# Patient Record
Sex: Female | Born: 1935 | Race: White | Hispanic: No | Marital: Single | Smoking: Never smoker
Health system: Southern US, Community
[De-identification: ages and names within clinical notes are randomized; demographics above are authoritative.]

## PROBLEM LIST (undated history)

## (undated) DIAGNOSIS — M1990 Unspecified osteoarthritis, unspecified site: Secondary | ICD-10-CM

## (undated) DIAGNOSIS — I480 Paroxysmal atrial fibrillation: Secondary | ICD-10-CM

## (undated) DIAGNOSIS — I440 Atrioventricular block, first degree: Secondary | ICD-10-CM

## (undated) DIAGNOSIS — E559 Vitamin D deficiency, unspecified: Secondary | ICD-10-CM

## (undated) DIAGNOSIS — L900 Lichen sclerosus et atrophicus: Secondary | ICD-10-CM

## (undated) DIAGNOSIS — M810 Age-related osteoporosis without current pathological fracture: Secondary | ICD-10-CM

## (undated) DIAGNOSIS — I495 Sick sinus syndrome: Secondary | ICD-10-CM

## (undated) DIAGNOSIS — E039 Hypothyroidism, unspecified: Secondary | ICD-10-CM

## (undated) HISTORY — DX: Age-related osteoporosis without current pathological fracture: M81.0

## (undated) HISTORY — PX: HAND SURGERY: SHX662

## (undated) HISTORY — DX: Unspecified osteoarthritis, unspecified site: M19.90

## (undated) HISTORY — DX: Lichen sclerosus et atrophicus: L90.0

## (undated) HISTORY — DX: Vitamin D deficiency, unspecified: E55.9

---
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
## 1972-02-17 HISTORY — PX: ABDOMINAL HYSTERECTOMY: SHX81

## 1999-06-24 ENCOUNTER — Other Ambulatory Visit: Admission: RE | Admit: 1999-06-24 | Discharge: 1999-06-24 | Payer: Self-pay | Admitting: *Deleted

## 2000-06-16 ENCOUNTER — Other Ambulatory Visit: Admission: RE | Admit: 2000-06-16 | Discharge: 2000-06-16 | Payer: Self-pay | Admitting: *Deleted

## 2001-09-07 ENCOUNTER — Ambulatory Visit (HOSPITAL_COMMUNITY): Admission: RE | Admit: 2001-09-07 | Discharge: 2001-09-07 | Payer: Self-pay | Admitting: Gastroenterology

## 2003-08-25 ENCOUNTER — Encounter: Admission: RE | Admit: 2003-08-25 | Discharge: 2003-08-25 | Payer: Self-pay | Admitting: Orthopedic Surgery

## 2003-11-15 ENCOUNTER — Encounter: Admission: RE | Admit: 2003-11-15 | Discharge: 2003-11-15 | Payer: Self-pay | Admitting: Orthopedic Surgery

## 2003-11-19 ENCOUNTER — Ambulatory Visit (HOSPITAL_BASED_OUTPATIENT_CLINIC_OR_DEPARTMENT_OTHER): Admission: RE | Admit: 2003-11-19 | Discharge: 2003-11-19 | Payer: Self-pay | Admitting: Orthopedic Surgery

## 2003-11-19 ENCOUNTER — Ambulatory Visit (HOSPITAL_COMMUNITY): Admission: RE | Admit: 2003-11-19 | Discharge: 2003-11-19 | Payer: Self-pay | Admitting: Orthopedic Surgery

## 2004-07-25 ENCOUNTER — Other Ambulatory Visit: Admission: RE | Admit: 2004-07-25 | Discharge: 2004-07-25 | Payer: Self-pay | Admitting: Gynecology

## 2011-12-21 ENCOUNTER — Other Ambulatory Visit: Payer: Self-pay | Admitting: Orthopedic Surgery

## 2011-12-21 MED ORDER — BUPIVACAINE 0.25 % ON-Q PUMP SINGLE CATH 300ML: 300.0000 mL | INJECTION | Status: DC

## 2011-12-21 MED ORDER — DEXAMETHASONE SODIUM PHOSPHATE 10 MG/ML IJ SOLN: 10.0000 mg | Freq: Once | INTRAMUSCULAR | Status: DC

## 2011-12-21 NOTE — Progress Notes (Signed)
Preoperative surgical orders have been place into the [facility] hospital system for Kristine Holden on 12/21/2011, 8:45 AM  by Patrica Duel for surgery on 01/25/12.  Preop Total Knee orders including Bupivacaine On-Q pump, IV Tylenol, and IV Decadron as long as there are no contraindications to the above medications. Avel Peace, PA-C

## 2012-01-12 ENCOUNTER — Encounter (HOSPITAL_COMMUNITY): Payer: Self-pay | Admitting: Pharmacy Technician

## 2012-01-20 NOTE — Progress Notes (Signed)
Holter monitor report 11/19/10 Dr. Anne Fu on chart, carotid duplex report 04/25/10 Dr. Anne Fu on chart

## 2012-01-20 NOTE — Progress Notes (Signed)
Last EKG 01/13/12 Dr. Anne Fu on chart, last office visit and cardiac clearance note 12/11/11 Dr. Anne Fu on chart, stress test 12/11/11 on chart, medical clearance Dr. Laurann Montana 11/12/11 on chart.

## 2012-01-20 NOTE — Patient Instructions (Addendum)
20 HONOUR SCHWIEGER  01/20/2012   Your procedure is scheduled on: 01/25/12  Report to [facility] at 10:00 AM.  Call this number if you have problems the morning of surgery [phone]: [phone]   Remember:   Do not eat food or drink After Midnight 01/24/12.       Take these medicines the morning of surgery with A SIP OF WATER: synthroid   Do not wear jewelry, make-up or nail polish.  Do not wear lotions, powders, or perfumes. You may wear deodorant.  Do not shave 48 hours prior to surgery. Men may shave face and neck.  Do not bring valuables to the hospital.  Contacts, dentures or bridgework may not be worn into surgery.  Leave suitcase in the car. After surgery it may be brought to your room.  For patients admitted to the hospital, checkout time is 11:00 AM the day of discharge.    Special Instructions: Shower using CHG 2 nights before surgery and the night before surgery.  If you shower the day of surgery use CHG.  Use special wash - you have one bottle of CHG for all showers.  You should use approximately 1/3 of the bottle for each shower.   Please read over the following fact sheets that you were given: MRSA Information, blood fact sheet, incentive spirometer fact sheet. Birdie Sons, RN  pre op nurse call if needed [phone]    FAILURE TO FOLLOW THESE INSTRUCTIONS MAY RESULT IN CANCELLATION OF YOUR SURGERY   Patient Signature: ___________________________________________

## 2012-01-21 ENCOUNTER — Encounter (HOSPITAL_COMMUNITY): Payer: Self-pay

## 2012-01-21 ENCOUNTER — Other Ambulatory Visit (HOSPITAL_COMMUNITY): Payer: Self-pay | Admitting: Orthopedic Surgery

## 2012-01-21 ENCOUNTER — Encounter (HOSPITAL_COMMUNITY)
Admission: RE | Admit: 2012-01-21 | Discharge: 2012-01-21 | Disposition: A | Discharge status: Home / Self Care | Payer: Medicare Other | Source: Ambulatory Visit | Attending: Orthopedic Surgery | Admitting: Orthopedic Surgery

## 2012-01-21 HISTORY — DX: Hypothyroidism, unspecified: E03.9

## 2012-01-21 HISTORY — DX: Unspecified osteoarthritis, unspecified site: M19.90

## 2012-01-21 LAB — CBC
HCT: 40.1 % (ref 36.0–46.0)
MCH: 30.5 pg (ref 26.0–34.0)
MCV: 90.5 fL (ref 78.0–100.0)
RDW: 13.2 % (ref 11.5–15.5)
WBC: 5.5 10*3/uL (ref 4.0–10.5)

## 2012-01-21 LAB — PROTIME-INR
INR: 0.92 (ref 0.00–1.49)
Prothrombin Time: 12.3 seconds (ref 11.6–15.2)

## 2012-01-21 LAB — COMPREHENSIVE METABOLIC PANEL
Albumin: 3.6 g/dL (ref 3.5–5.2)
BUN: 19 mg/dL (ref 6–23)
CO2: 26 mEq/L (ref 19–32)
Chloride: 104 mEq/L (ref 96–112)
Creatinine, Ser: 0.62 mg/dL (ref 0.50–1.10)
GFR calc non Af Amer: 82 mL/min — ABNORMAL LOW (ref >90)
Total Bilirubin: 0.4 mg/dL (ref 0.3–1.2)

## 2012-01-21 LAB — URINALYSIS, ROUTINE W REFLEX MICROSCOPIC
Bilirubin Urine: NEGATIVE
Glucose, UA: NEGATIVE mg/dL
Ketones, ur: NEGATIVE mg/dL
pH: 5 (ref 5.0–8.0)

## 2012-01-21 LAB — URINE MICROSCOPIC-ADD ON

## 2012-01-21 LAB — APTT: aPTT: 23 seconds — ABNORMAL LOW (ref 24–37)

## 2012-01-21 NOTE — Progress Notes (Signed)
miroc ua results faxed to dr Lequita Halt by epic.

## 2012-01-24 ENCOUNTER — Other Ambulatory Visit: Payer: Self-pay | Admitting: Orthopedic Surgery

## 2012-01-24 NOTE — H&P (Signed)
Kristine Holden  DOB: 05/13/1927 Single / Language: English / Race: White Female  Date of Admission:01/25/2012  Chief complaint:  Right knee pain  History of Present Illness The patient is a 78 year old female who comes in for a preoperative History and Physical. The patient is scheduled for a right total knee arthroplasty to be performed by Dr. Frank V. Aluisio, MD at [facility] on 01/25/12. The patient is a 78 year old female who presents today for follow up of their knee. The patient is being followed for their right knee pain. They are out from cortisone injection. Symptoms reported today include: pain. The patient feels that they are doing poorly (Patient states that the injection helped for a short period and went back to how it felt before the injection.). Current treatment includes: Aleve. Ms. Baller did not have any prolonged benefit from the cortisone. She is now ready to get the knee replaced. They have been treated conservatively in the past for the above stated problem and despite conservative measures, they continue to have progressive pain and severe functional limitations and dysfunction. They have failed non-operative management including home exercise, medications, and injections. It is felt that they would benefit from undergoing total joint replacement. Risks and benefits of the procedure have been discussed with the patient and they elect to proceed with surgery. There are no active contraindications to surgery such as ongoing infection or rapidly progressive neurological disease.   Problem List Osteoarthritis, knee (715.96)   Allergies Augmentin *PENICILLINS*. Sickness Penicillins. Rash.   Family History Cerebrovascular Accident. brother Congestive Heart Failure. father Father. Deceased, Myocardial infarction. age 74 Mother. Deceased, Deep vein thrombosis. age 47; Pulmonary Embolous Brother 1. Deceased, Coronary artery disease, CABG.  age late 70's   Social History Tobacco / smoke exposure. yes outdoors only Children. 0 Illicit drug use. no Marital status. single Copy of Drug/Alcohol Rehab (Previously). no Exercise. Exercises daily; does running / walking and other No alcohol use Number of flights of stairs before winded. 4-5 Pain Contract. no Current work status. retired Drug/Alcohol Rehab (Currently). no Tobacco use. Never smoker. Advance Directives. Living Will, Healthcare POA Post-Surgical Plans. Plan to go home with sister.   Medication History Synthroid (75MCG Tablet, Oral) Active. Aleve ( Oral) Specific dose unknown - Active. (prn) Aspirin Childrens (81MG Tablet Chewable, Oral) Active.   Past Surgical History Rotator Cuff Repair. Date: 2005. right Appendectomy. Date: 1952. Hysterectomy. Date: 1974. complete (non-cancerous) Tonsillectomy. Date: 1948.   Medical History Hypercholesterolemia Hypothyroidism Hemorrhoids Urinary Incontinence Varicose veins Eczema   Review of Systems General:Present- Night Sweats. Not Present- Chills, Fever, Fatigue, Weight Gain, Weight Loss and Memory Loss. Skin:Not Present- Hives, Itching, Rash, Eczema and Lesions. HEENT:Not Present- Tinnitus, Headache, Double Vision, Visual Loss, Hearing Loss and Dentures. Respiratory:Not Present- Shortness of breath with exertion, Shortness of breath at rest, Allergies, Coughing up blood and Chronic Cough. Cardiovascular:Not Present- Chest Pain, Racing/skipping heartbeats, Difficulty Breathing Lying Down, Murmur, Swelling and Palpitations. Gastrointestinal:Present- Constipation. Not Present- Bloody Stool, Heartburn, Abdominal Pain, Vomiting, Nausea, Diarrhea, Difficulty Swallowing, Jaundice and Loss of appetitie. Female Genitourinary:Present- Urinary frequency. Not Present- Blood in Urine, Weak urinary stream, Discharge, Flank Pain, Incontinence, Painful Urination, Urgency, Urinary Retention and  Urinating at Night. Musculoskeletal:Present- Muscle Weakness, Joint Swelling and Joint Pain. Not Present- Muscle Pain, Back Pain, Morning Stiffness and Spasms. Neurological:Not Present- Tremor, Dizziness, Blackout spells, Paralysis, Difficulty with balance and Weakness. Psychiatric:Not Present- Insomnia.   Vitals Weight: 145 lb Height: 66 in Weight was reported by patient.   Height was reported by patient. Body Surface Area: 1.75 m Body Mass Index: 23.4 kg/m Pulse: 64 (Regular) Resp.: 14 (Unlabored) BP: 118/62 (Sitting, Right Arm, Standard)    Physical Exam The physical exam findings are as follows:   General Mental Status - Alert, cooperative and good historian. General Appearance- pleasant. Not in acute distress. Orientation- Oriented X3. Build & Nutrition- Well nourished and Well developed.   Head and Neck Head- normocephalic, atraumatic . Neck Global Assessment- bruit auscultated on the left (very faint) and supple. no bruit auscultated on the right.   Eye Pupil- Bilateral- Regular and Round. Note: wears glasses Motion- Bilateral- EOMI.   Chest and Lung Exam Auscultation: Breath sounds:- clear at anterior chest wall and - clear at posterior chest wall. Adventitious sounds:- No Adventitious sounds.   Cardiovascular Auscultation:Rhythm- Regular rate and rhythm. Heart Sounds- S1 WNL and S2 WNL. Murmurs & Other Heart Sounds:Auscultation of the heart reveals - No Murmurs.   Abdomen Palpation/Percussion:Tenderness- Abdomen is non-tender to palpation. Rigidity (guarding)- Abdomen is soft. Auscultation:Auscultation of the abdomen reveals - Bowel sounds normal.   Female Genitourinary Not done, not pertinent to present illness  Musculoskeletal On exam, well-developed female, alert and oriented, in no apparent distress. Right knee shows trace effusion. Range is about 5-115. There is marked crepitus on ROM. There is  tenderness, lateral greater than medial. No instability. She has a varus deformity.  Radiographs: x-rays again demonstrating bone on bone arthritis in the lateral and patellofemoral compartments with varus deformity.  Assessment & Plan Osteoarthritis, knee (715.96) Impression: Right Knee  Note: Plan is to to undergo a Right Total Knee Replacement by Dr. Aluisio.  Plan is to go home with sister.  PCP - Dr. Cynthia White - Patient has been seen preoperatively and felt to be stable for surgery. Cardiology - Dr. Mark Skains - Patient has undergone a recent stress and was found to be low risk and no ischemia and felt that she could proceed with surgery.  Signed electronically by DREW L Savino Whisenant, PA-C 

## 2012-01-25 ENCOUNTER — Encounter (HOSPITAL_COMMUNITY): Payer: Self-pay | Admitting: Orthopedic Surgery

## 2012-01-25 ENCOUNTER — Inpatient Hospital Stay (HOSPITAL_COMMUNITY): Payer: Medicare Other | Admitting: *Deleted

## 2012-01-25 ENCOUNTER — Encounter (HOSPITAL_COMMUNITY): Payer: Self-pay | Admitting: *Deleted

## 2012-01-25 ENCOUNTER — Inpatient Hospital Stay (HOSPITAL_COMMUNITY)
Admission: RE | Admit: 2012-01-25 | Discharge: 2012-01-28 | DRG: 470 | Disposition: A | Discharge status: Home Health | Payer: Medicare Other | Source: Ambulatory Visit | Attending: Orthopedic Surgery | Admitting: Orthopedic Surgery

## 2012-01-25 ENCOUNTER — Encounter (HOSPITAL_COMMUNITY)
Admission: RE | Disposition: A | Discharge status: Home Health | Payer: Self-pay | Source: Ambulatory Visit | Attending: Orthopedic Surgery

## 2012-01-25 DIAGNOSIS — E7800 Pure hypercholesterolemia, unspecified: Secondary | ICD-10-CM | POA: Diagnosis present

## 2012-01-25 DIAGNOSIS — M179 Osteoarthritis of knee, unspecified: Secondary | ICD-10-CM | POA: Diagnosis present

## 2012-01-25 DIAGNOSIS — M1710 Unilateral primary osteoarthritis, unspecified knee: Principal | ICD-10-CM | POA: Diagnosis present

## 2012-01-25 DIAGNOSIS — Z79899 Other long term (current) drug therapy: Secondary | ICD-10-CM

## 2012-01-25 DIAGNOSIS — D62 Acute posthemorrhagic anemia: Secondary | ICD-10-CM | POA: Diagnosis not present

## 2012-01-25 DIAGNOSIS — Z880 Allergy status to penicillin: Secondary | ICD-10-CM

## 2012-01-25 DIAGNOSIS — Z96659 Presence of unspecified artificial knee joint: Secondary | ICD-10-CM

## 2012-01-25 DIAGNOSIS — E039 Hypothyroidism, unspecified: Secondary | ICD-10-CM | POA: Diagnosis present

## 2012-01-25 HISTORY — PX: TOTAL KNEE ARTHROPLASTY: SHX125

## 2012-01-25 SURGERY — ARTHROPLASTY, KNEE, TOTAL
Anesthesia: Spinal | Site: Knee | Laterality: Right | Wound class: Clean

## 2012-01-25 MED ORDER — POLYETHYL GLYCOL-PROPYL GLYCOL 0.4-0.3 % OP SOLN: 1.0000 [drp] | Freq: Every day | OPHTHALMIC | Status: DC

## 2012-01-25 MED ORDER — OXYCODONE HCL 5 MG PO TABS
5.0000 mg | ORAL_TABLET | ORAL | Status: DC | PRN
  Administered 2012-01-25 – 2012-01-27 (×6): 10 mg via ORAL
  Filled 2012-01-25 (×7): qty 2

## 2012-01-25 MED ORDER — LEVOTHYROXINE SODIUM 75 MCG PO TABS
75.0000 ug | ORAL_TABLET | Freq: Every day | ORAL | Status: DC
  Administered 2012-01-28: 75 ug via ORAL
  Filled 2012-01-25 (×4): qty 1

## 2012-01-25 MED ORDER — METHOCARBAMOL 500 MG PO TABS
500.0000 mg | ORAL_TABLET | Freq: Four times a day (QID) | ORAL | Status: DC | PRN
  Administered 2012-01-25 – 2012-01-28 (×3): 500 mg via ORAL
  Filled 2012-01-25 (×4): qty 1

## 2012-01-25 MED ORDER — ONDANSETRON HCL 4 MG/2ML IJ SOLN: 4.0000 mg | Freq: Four times a day (QID) | INTRAMUSCULAR | Status: DC | PRN

## 2012-01-25 MED ORDER — DEXAMETHASONE SODIUM PHOSPHATE 10 MG/ML IJ SOLN
INTRAMUSCULAR | Status: DC | PRN
  Administered 2012-01-25: 10 mg via INTRAVENOUS

## 2012-01-25 MED ORDER — BUPIVACAINE ON-Q PAIN PUMP (FOR ORDER SET NO CHG)
INJECTION | Status: DC
  Filled 2012-01-25: qty 1

## 2012-01-25 MED ORDER — POLYETHYLENE GLYCOL 3350 17 G PO PACK: 17.0000 g | PACK | Freq: Every day | ORAL | Status: DC | PRN

## 2012-01-25 MED ORDER — MIDAZOLAM HCL 5 MG/5ML IJ SOLN
INTRAMUSCULAR | Status: DC | PRN
  Administered 2012-01-25 (×2): 1 mg via INTRAVENOUS

## 2012-01-25 MED ORDER — POLYVINYL ALCOHOL 1.4 % OP SOLN
1.0000 [drp] | OPHTHALMIC | Status: DC | PRN
  Filled 2012-01-25: qty 15

## 2012-01-25 MED ORDER — DIPHENHYDRAMINE HCL 12.5 MG/5ML PO ELIX: 12.5000 mg | ORAL_SOLUTION | ORAL | Status: DC | PRN

## 2012-01-25 MED ORDER — PROPOFOL 10 MG/ML IV EMUL
INTRAVENOUS | Status: DC | PRN
  Administered 2012-01-25: 75 ug/kg/min via INTRAVENOUS

## 2012-01-25 MED ORDER — ACETAMINOPHEN 10 MG/ML IV SOLN
1000.0000 mg | Freq: Four times a day (QID) | INTRAVENOUS | Status: AC
  Administered 2012-01-25 – 2012-01-26 (×4): 1000 mg via INTRAVENOUS
  Filled 2012-01-25 (×6): qty 100

## 2012-01-25 MED ORDER — DEXTROSE-NACL 5-0.9 % IV SOLN
INTRAVENOUS | Status: DC
  Administered 2012-01-25 – 2012-01-26 (×2): via INTRAVENOUS

## 2012-01-25 MED ORDER — MORPHINE SULFATE 2 MG/ML IJ SOLN: 1.0000 mg | INTRAMUSCULAR | Status: DC | PRN

## 2012-01-25 MED ORDER — METOCLOPRAMIDE HCL 10 MG PO TABS: 5.0000 mg | ORAL_TABLET | Freq: Three times a day (TID) | ORAL | Status: DC | PRN

## 2012-01-25 MED ORDER — BUPIVACAINE IN DEXTROSE 0.75-8.25 % IT SOLN
INTRATHECAL | Status: DC | PRN
  Administered 2012-01-25: 1.5 mL via INTRATHECAL

## 2012-01-25 MED ORDER — 0.9 % SODIUM CHLORIDE (POUR BTL) OPTIME
TOPICAL | Status: DC | PRN
  Administered 2012-01-25: 1000 mL

## 2012-01-25 MED ORDER — SODIUM CHLORIDE 0.9 % IV SOLN: INTRAVENOUS | Status: DC

## 2012-01-25 MED ORDER — METOCLOPRAMIDE HCL 5 MG/ML IJ SOLN: 5.0000 mg | Freq: Three times a day (TID) | INTRAMUSCULAR | Status: DC | PRN

## 2012-01-25 MED ORDER — BISACODYL 10 MG RE SUPP
10.0000 mg | Freq: Every day | RECTAL | Status: DC | PRN
  Administered 2012-01-27: 10 mg via RECTAL
  Filled 2012-01-25: qty 1

## 2012-01-25 MED ORDER — ACETAMINOPHEN 10 MG/ML IV SOLN: 1000.0000 mg | Freq: Once | INTRAVENOUS | Status: DC

## 2012-01-25 MED ORDER — DEXAMETHASONE 4 MG PO TABS
10.0000 mg | ORAL_TABLET | Freq: Once | ORAL | Status: AC
  Filled 2012-01-25: qty 2.5

## 2012-01-25 MED ORDER — RIVAROXABAN 10 MG PO TABS
10.0000 mg | ORAL_TABLET | Freq: Every day | ORAL | Status: DC
  Administered 2012-01-26 – 2012-01-28 (×3): 10 mg via ORAL
  Filled 2012-01-25 (×5): qty 1

## 2012-01-25 MED ORDER — METHOCARBAMOL 100 MG/ML IJ SOLN
500.0000 mg | Freq: Four times a day (QID) | INTRAVENOUS | Status: DC | PRN
  Administered 2012-01-25: 500 mg via INTRAVENOUS
  Filled 2012-01-25: qty 5

## 2012-01-25 MED ORDER — ONDANSETRON HCL 4 MG/2ML IJ SOLN
INTRAMUSCULAR | Status: DC | PRN
  Administered 2012-01-25: 4 mg via INTRAVENOUS

## 2012-01-25 MED ORDER — TRAMADOL HCL 50 MG PO TABS: 50.0000 mg | ORAL_TABLET | Freq: Four times a day (QID) | ORAL | Status: DC | PRN

## 2012-01-25 MED ORDER — ACETAMINOPHEN 650 MG RE SUPP: 650.0000 mg | Freq: Four times a day (QID) | RECTAL | Status: DC | PRN

## 2012-01-25 MED ORDER — FLEET ENEMA 7-19 GM/118ML RE ENEM: 1.0000 | ENEMA | Freq: Once | RECTAL | Status: AC | PRN

## 2012-01-25 MED ORDER — PHENOL 1.4 % MT LIQD: 1.0000 | OROMUCOSAL | Status: DC | PRN

## 2012-01-25 MED ORDER — BUPIVACAINE 0.25 % ON-Q PUMP SINGLE CATH 300ML
INJECTION | Status: DC | PRN
  Administered 2012-01-25: 300 mL

## 2012-01-25 MED ORDER — ACETAMINOPHEN 325 MG PO TABS: 650.0000 mg | ORAL_TABLET | Freq: Four times a day (QID) | ORAL | Status: DC | PRN

## 2012-01-25 MED ORDER — CEFAZOLIN SODIUM 1-5 GM-% IV SOLN
1.0000 g | Freq: Four times a day (QID) | INTRAVENOUS | Status: AC
  Administered 2012-01-25 – 2012-01-26 (×2): 1 g via INTRAVENOUS
  Filled 2012-01-25 (×2): qty 50

## 2012-01-25 MED ORDER — PROMETHAZINE HCL 25 MG/ML IJ SOLN: 6.2500 mg | INTRAMUSCULAR | Status: DC | PRN

## 2012-01-25 MED ORDER — ONDANSETRON HCL 4 MG PO TABS: 4.0000 mg | ORAL_TABLET | Freq: Four times a day (QID) | ORAL | Status: DC | PRN

## 2012-01-25 MED ORDER — ACETAMINOPHEN 10 MG/ML IV SOLN
INTRAVENOUS | Status: DC | PRN
  Administered 2012-01-25: 1000 mg via INTRAVENOUS

## 2012-01-25 MED ORDER — FENTANYL CITRATE 0.05 MG/ML IJ SOLN
INTRAMUSCULAR | Status: DC | PRN
  Administered 2012-01-25: 100 ug via INTRAVENOUS

## 2012-01-25 MED ORDER — HYDROMORPHONE HCL PF 1 MG/ML IJ SOLN: 0.2500 mg | INTRAMUSCULAR | Status: DC | PRN

## 2012-01-25 MED ORDER — CEFAZOLIN SODIUM-DEXTROSE 2-3 GM-% IV SOLR
2.0000 g | INTRAVENOUS | Status: AC
  Administered 2012-01-25: 2 g via INTRAVENOUS

## 2012-01-25 MED ORDER — MENTHOL 3 MG MT LOZG: 1.0000 | LOZENGE | OROMUCOSAL | Status: DC | PRN

## 2012-01-25 MED ORDER — DOCUSATE SODIUM 100 MG PO CAPS
100.0000 mg | ORAL_CAPSULE | Freq: Two times a day (BID) | ORAL | Status: DC
  Administered 2012-01-25 – 2012-01-28 (×6): 100 mg via ORAL

## 2012-01-25 MED ORDER — DEXAMETHASONE SODIUM PHOSPHATE 10 MG/ML IJ SOLN
10.0000 mg | Freq: Once | INTRAMUSCULAR | Status: AC
  Administered 2012-01-26: 10 mg via INTRAVENOUS
  Filled 2012-01-25: qty 1

## 2012-01-25 MED ORDER — LACTATED RINGERS IV SOLN
INTRAVENOUS | Status: DC
  Administered 2012-01-25: 1000 mL via INTRAVENOUS
  Administered 2012-01-25: 12:00:00 via INTRAVENOUS

## 2012-01-25 SURGICAL SUPPLY — 56 items
BAG SPEC THK2 15X12 ZIP CLS (MISCELLANEOUS) ×1
BAG ZIPLOCK 12X15 (MISCELLANEOUS) ×2 IMPLANT
BANDAGE ELASTIC 6 VELCRO ST LF (GAUZE/BANDAGES/DRESSINGS) ×2 IMPLANT
BANDAGE ESMARK 6X9 LF (GAUZE/BANDAGES/DRESSINGS) ×1 IMPLANT
BLADE SAG 18X100X1.27 (BLADE) ×2 IMPLANT
BLADE SAW SGTL 11.0X1.19X90.0M (BLADE) ×2 IMPLANT
BNDG CMPR 9X6 STRL LF SNTH (GAUZE/BANDAGES/DRESSINGS) ×1
BNDG ESMARK 6X9 LF (GAUZE/BANDAGES/DRESSINGS) ×2
BOWL SMART MIX CTS (DISPOSABLE) ×2 IMPLANT
CATH KIT ON-Q SILVERSOAK 5 (CATHETERS) ×1 IMPLANT
CATH KIT ON-Q SILVERSOAK 5IN (CATHETERS) ×2 IMPLANT
CEMENT HV SMART SET (Cement) ×3 IMPLANT
CLOTH BEACON ORANGE TIMEOUT ST (SAFETY) ×2 IMPLANT
CUFF TOURN SGL QUICK 34 (TOURNIQUET CUFF) ×2
CUFF TRNQT CYL 34X4X40X1 (TOURNIQUET CUFF) ×1 IMPLANT
DRAPE EXTREMITY T 121X128X90 (DRAPE) ×2 IMPLANT
DRAPE POUCH INSTRU U-SHP 10X18 (DRAPES) ×2 IMPLANT
DRAPE U-SHAPE 47X51 STRL (DRAPES) ×2 IMPLANT
DRSG ADAPTIC 3X8 NADH LF (GAUZE/BANDAGES/DRESSINGS) ×2 IMPLANT
DRSG PAD ABDOMINAL 8X10 ST (GAUZE/BANDAGES/DRESSINGS) ×1 IMPLANT
DURAPREP 26ML APPLICATOR (WOUND CARE) ×2 IMPLANT
ELECT REM PT RETURN 9FT ADLT (ELECTROSURGICAL) ×2
ELECTRODE REM PT RTRN 9FT ADLT (ELECTROSURGICAL) ×1 IMPLANT
EVACUATOR 1/8 PVC DRAIN (DRAIN) ×2 IMPLANT
FACESHIELD LNG OPTICON STERILE (SAFETY) ×10 IMPLANT
GLOVE BIO SURGEON STRL SZ8 (GLOVE) ×2 IMPLANT
GLOVE BIOGEL PI IND STRL 7.0 (GLOVE) IMPLANT
GLOVE BIOGEL PI IND STRL 8 (GLOVE) ×2 IMPLANT
GLOVE BIOGEL PI INDICATOR 7.0 (GLOVE) ×2
GLOVE BIOGEL PI INDICATOR 8 (GLOVE) ×2
GLOVE ECLIPSE 8.0 STRL XLNG CF (GLOVE) ×2 IMPLANT
GLOVE SURG SS PI 6.5 STRL IVOR (GLOVE) ×6 IMPLANT
GOWN STRL NON-REIN LRG LVL3 (GOWN DISPOSABLE) ×4 IMPLANT
GOWN STRL REIN XL XLG (GOWN DISPOSABLE) ×2 IMPLANT
HANDPIECE INTERPULSE COAX TIP (DISPOSABLE) ×2
IMMOBILIZER KNEE 20 (SOFTGOODS) ×2
IMMOBILIZER KNEE 20 THIGH 36 (SOFTGOODS) ×1 IMPLANT
KIT BASIN OR (CUSTOM PROCEDURE TRAY) ×2 IMPLANT
MANIFOLD NEPTUNE II (INSTRUMENTS) ×2 IMPLANT
NS IRRIG 1000ML POUR BTL (IV SOLUTION) ×2 IMPLANT
PACK TOTAL JOINT (CUSTOM PROCEDURE TRAY) ×2 IMPLANT
PAD ABD 7.5X8 STRL (GAUZE/BANDAGES/DRESSINGS) ×2 IMPLANT
PADDING CAST COTTON 6X4 STRL (CAST SUPPLIES) ×4 IMPLANT
POSITIONER SURGICAL ARM (MISCELLANEOUS) ×2 IMPLANT
SET HNDPC FAN SPRY TIP SCT (DISPOSABLE) ×1 IMPLANT
SPONGE GAUZE 4X4 12PLY (GAUZE/BANDAGES/DRESSINGS) ×2 IMPLANT
STRIP CLOSURE SKIN 1/2X4 (GAUZE/BANDAGES/DRESSINGS) ×3 IMPLANT
SUCTION FRAZIER 12FR DISP (SUCTIONS) ×2 IMPLANT
SUT MNCRL AB 4-0 PS2 18 (SUTURE) ×2 IMPLANT
SUT VIC AB 2-0 CT1 27 (SUTURE) ×6
SUT VIC AB 2-0 CT1 TAPERPNT 27 (SUTURE) ×3 IMPLANT
SUT VLOC 180 0 24IN GS25 (SUTURE) ×2 IMPLANT
TOWEL OR 17X26 10 PK STRL BLUE (TOWEL DISPOSABLE) ×4 IMPLANT
TRAY FOLEY CATH 14FRSI W/METER (CATHETERS) ×2 IMPLANT
WATER STERILE IRR 1500ML POUR (IV SOLUTION) ×2 IMPLANT
WRAP KNEE MAXI GEL POST OP (GAUZE/BANDAGES/DRESSINGS) ×4 IMPLANT

## 2012-01-25 NOTE — Anesthesia Procedure Notes (Signed)
Spinal  Patient location during procedure: OR End time: 01/25/2012 11:40 AM Staffing CRNA/Resident: Enriqueta Shutter Performed by: resident/CRNA  Preanesthetic Checklist Completed: patient identified, site marked, surgical consent, pre-op evaluation, timeout performed, IV checked, risks and benefits discussed and monitors and equipment checked Spinal Block Patient position: sitting Prep: Betadine Patient monitoring: heart rate, continuous pulse ox and blood pressure Approach: midline Location: L3-4 Injection technique: single-shot Needle Needle type: Sprotte  Needle gauge: 24 G Needle length: 9 cm Assessment Sensory level: T6 Additional Notes Expiration date of kit checked and confirmed. Patient tolerated procedure well, without complications.

## 2012-01-25 NOTE — Progress Notes (Signed)
Utilization review completed.  

## 2012-01-25 NOTE — Anesthesia Preprocedure Evaluation (Signed)
Anesthesia Evaluation  Patient identified by MRN, date of birth, ID band Patient awake    Reviewed: Allergy & Precautions, H&P , NPO status , Patient's Chart, lab work & pertinent test results, reviewed documented beta blocker date and time   Airway Mallampati: II TM Distance: >3 FB Neck ROM: full    Dental No notable dental hx.    Pulmonary neg pulmonary ROS,  breath sounds clear to auscultation  Pulmonary exam normal       Cardiovascular Exercise Tolerance: Good negative cardio ROS  Rhythm:regular Rate:Normal     Neuro/Psych negative neurological ROS  negative psych ROS   GI/Hepatic negative GI ROS, Neg liver ROS,   Endo/Other  negative endocrine ROSHypothyroidism   Renal/GU negative Renal ROS  negative genitourinary   Musculoskeletal   Abdominal   Peds  Hematology negative hematology ROS (+)   Anesthesia Other Findings   Reproductive/Obstetrics negative OB ROS                           Anesthesia Physical Anesthesia Plan  ASA: II  Anesthesia Plan: Spinal   Post-op Pain Management:    Induction:   Airway Management Planned:   Additional Equipment:   Intra-op Plan:   Post-operative Plan:   Informed Consent: I have reviewed the patients History and Physical, chart, labs and discussed the procedure including the risks, benefits and alternatives for the proposed anesthesia with the patient or authorized representative who has indicated his/her understanding and acceptance.   Dental Advisory Given  Plan Discussed with: CRNA  Anesthesia Plan Comments: (Discussed risks/benefits of spinal including headache, backache, failure, bleeding, infection, and nerve damage. Patient consents to spinal. Questions answered. Coagulation studies and platelet count acceptable. )        Anesthesia Quick Evaluation

## 2012-01-25 NOTE — Op Note (Signed)
Pre-operative diagnosis- Osteoarthritis Right knee(s)  Post-operative diagnosis- Osteoarthritis  Right knee(s)  Procedure-   Right Total Knee Arthroplasty  Surgeon- Gus Rankin. Jimmylee Ratterree, MD  Assistant- Leilani Able, PA-C   Anesthesia-  Spinal   EBL- * No blood loss amount entered *   Drains Hemovac   Tourniquet time  Total Tourniquet Time Documented: Thigh (Right) - 32 minutes   Complications- None  Condition-PACU - hemodynamically stable.   Brief Clinical Note   Kristine Holden is a 78 y.o. year old female with end stage OA of her right knee with progressively worsening pain and dysfunction. She has constant pain, with activity and at rest and significant functional deficits with difficulties even with ADLs. She has had extensive non-op management including analgesics, injections of cortisone and viscosupplements, and home exercise program, but remains in significant pain with significant dysfunction.Radiographs show bone on bone arthritis lateral and patellofemoral with valgus deformity. She presents now for right Total Knee Arthroplasty.    Procedure in detail---       The patient is brought into the operating room and positioned supine on the operating table. After successful administration of Spinal anesthetic, a tourniquet is placed high on the Right thigh(s) and the lower extremity is prepped and draped in the usual sterile fashion. Time out is performed by the operating team and then the Right  lower extremity is wrapped in Esmarch, knee flexed and the tourniquet inflated to 300 mmHg.       A midline incision is made with a ten blade through the subcutaneous tissue to the level of the extensor mechanism. A fresh blade is used to make a lateral parapatellar arthrotomy due to the patients' valgus deformity. Soft tissue over the proximal lateral tibia is subperiosteally elevated to the joint line with a knife to the posterolateral corner but not including the structures of the  posterolateral corner. Soft tissue over the proximal medial tibia is elevated with attention being paid to avoiding the patellar tendon on the tibial tubercle. The patella is everted medially, knee flexed 90 degrees and the ACL and PCL are removed. Findings are bone on bone lateral and patellofemoral with large lateral and patellar osteophytes. .       The drill is used to create a starting hole in the distal femur and the canal is thoroughly irrigated with sterile saline to remove the fatty contents. The 5 degree Right  valgus alignment guide is placed into the femoral canal and the distal femoral cutting block is pinned to remove 10  mm off the distal femur. Resection is made with an oscillating saw.      The tibia is subluxed forward and the menisci are removed. The extramedullary alignment guide is placed referencing proximally at the medial aspect of the tibial tubercle and distally along the second metatarsal axis and tibial crest. The block is pinned to remove 2mm off the more deficient lateral side. Resection is made with an oscillating saw. Size 3  is the most appropriate size for the tibia and the proximal tibia is prepared with the modular drill and keel punch for that size.      The femoral sizing guide is placed and size 4  Narrow is most appropriate. Rotation is marked off the epicondylar axis and confirmed by creating a rectangular flexion gap at 90 degrees. The size 4  cutting block is pinned in this rotation and the anterior, posterior and chamfer cuts are made with the oscillating saw. The intercondylar block is  then placed and that cut is made.      Trial size 3  tibial component, trial size 4  Narrow posterior stabilized femur and a 12.5  mm posterior stabilized rotating platform insert trial is placed. Full extension is achieved with excellent varus/valgus and   anterior/posterior balance throughout full range of motion. The patella is everted and thickness measured to be 22  mm. Free hand  resection is taken to 12 mm, a 35 template is placed, lug holes are drilled, trial patella is placed, and it tracks normally. Osteophytes are removed off the posterior femur with the trial in place. All trials are removed and the cut bone surfaces prepared with pulsatile lavage. Cement is mixed and once ready for implantation, the size 3  tibial implant, size 4 narrow posterior stabilized femoral component, and the size 35  patella are cemented in place and the patella is held with the clamp. The trial insert is placed and the knee held in full extension. All extruded cement is removed and once the cement is hard the permanent 12.5  mm posterior stabilized rotating platform insert is placed into the tibial tray.      The wound is copiously irrigated with saline solution and the tourniquet is released for a total   tourniquet time of 31  minutes. Bleeding is identified and controlled with electrocautery. The extensor mechanism is closed with interrupted #1 PDS leaving open a small area from the superior to inferior pole of the patella to serve as a mini lateral release. Flexion against gravity is 140  degrees and the patella tracks normally. Subcutaneous tissue is closed with 2.0 vicryl and subcuticular with running 4.0 Monocryl. The catheter for the Marcaine pain pump is placed and the pump is initiated. The incision is cleaned and dried and steri-strips and a bulky sterile dressing are applied. The limb is placed into a knee immobilizer and the patient is awakened and transported to recovery in stable condition.      Please note that a surgical assistant was a medical necessity for this procedure in order to perform it in a safe and expeditious manner. Surgical assistant was necessary to retract the ligaments and vital neurovascular structures to prevent injury to them and also necessary for proper positioning of the limb to allow for anatomic placement of the prosthesis.    Gus Rankin Kristine Nordell,  MD    01/25/2012, 12:38 PM

## 2012-01-25 NOTE — Transfer of Care (Signed)
Immediate Anesthesia Transfer of Care Note  Patient: Kristine Holden  Procedure(s) Performed: Procedure(s) (LRB) with comments: TOTAL KNEE ARTHROPLASTY (Right)  Patient Location: PACU  Anesthesia Type:Regional  Level of Consciousness: awake, alert  and oriented  Airway & Oxygen Therapy: Patient Spontanous Breathing and Patient connected to face mask oxygen  Post-op Assessment: Report given to PACU RN and Post -op Vital signs reviewed and stable  Post vital signs: Reviewed and stable  Complications: No apparent anesthesia complications

## 2012-01-25 NOTE — Interval H&P Note (Signed)
History and Physical Interval Note:  01/25/2012 11:28 AM  Kristine Holden  has presented today for surgery, with the diagnosis of Osteoarthritis of the Right Knee  The various methods of treatment have been discussed with the patient and family. After consideration of risks, benefits and other options for treatment, the patient has consented to  Procedure(s) (LRB) with comments: TOTAL KNEE ARTHROPLASTY (Right) as a surgical intervention .  The patient's history has been reviewed, patient examined, no change in status, stable for surgery.  I have reviewed the patient's chart and labs.  Questions were answered to the patient's satisfaction.     Loanne Drilling

## 2012-01-25 NOTE — H&P (View-Only) (Signed)
Kristine Holden  DOB: [birth year] Single / Language: [place] / Race: White Female  Date of Admission:01/25/2012  Chief complaint:  Right knee pain  History of Present Illness The patient is a 78 year old female who comes in for a preoperative History and Physical. The patient is scheduled for a right total knee arthroplasty to be performed by Dr. Gus Rankin. Aluisio, MD at [facility] on 01/25/12. The patient is a 78 year old female who presents today for follow up of their knee. The patient is being followed for their right knee pain. They are out from cortisone injection. Symptoms reported today include: pain. The patient feels that they are doing poorly (Patient states that the injection helped for a short period and went back to how it felt before the injection.). Current treatment includes: Aleve. Kristine Holden did not have any prolonged benefit from the cortisone. She is now ready to get the knee replaced. They have been treated conservatively in the past for the above stated problem and despite conservative measures, they continue to have progressive pain and severe functional limitations and dysfunction. They have failed non-operative management including home exercise, medications, and injections. It is felt that they would benefit from undergoing total joint replacement. Risks and benefits of the procedure have been discussed with the patient and they elect to proceed with surgery. There are no active contraindications to surgery such as ongoing infection or rapidly progressive neurological disease.   Problem List Osteoarthritis, knee (715.96)   Allergies Augmentin *PENICILLINS*. Sickness Penicillins. Rash.   Family History Cerebrovascular Accident. brother Congestive Heart Failure. father Father. Deceased, Myocardial infarction. age 54 Mother. Deceased, Deep vein thrombosis. age 98; Pulmonary Embolous Brother 1. Deceased, Coronary artery disease, CABG.  age late 56's   Social History Tobacco / smoke exposure. yes outdoors only Children. 0 Illicit drug use. no Marital status. single Copy of Drug/Alcohol Rehab (Previously). no Exercise. Exercises daily; does running / walking and other No alcohol use Number of flights of stairs before winded. 4-5 Pain Contract. no Current work status. retired Financial planner (Currently). no Tobacco use. Never smoker. Advance Directives. Living Will, Healthcare POA Post-Surgical Plans. Plan to go home with sister.   Medication History Synthroid ( Tablet, Oral) Active. Aleve ( Oral) Specific dose unknown - Active. (prn) Aspirin Childrens (81MG  Tablet Chewable, Oral) Active.   Past Surgical History Rotator Cuff Repair. Date: 2005. right Appendectomy. Date: 46. Hysterectomy. Date: 74. complete (non-cancerous) Tonsillectomy. Date: 69.   Medical History Hypercholesterolemia Hypothyroidism Hemorrhoids Urinary Incontinence Varicose veins Eczema   Review of Systems General:Present- Night Sweats. Not Present- Chills, Fever, Fatigue, Weight Gain, Weight Loss and Memory Loss. Skin:Not Present- Hives, Itching, Rash, Eczema and Lesions. HEENT:Not Present- Tinnitus, Headache, Double Vision, Visual Loss, Hearing Loss and Dentures. Respiratory:Not Present- Shortness of breath with exertion, Shortness of breath at rest, Allergies, Coughing up blood and Chronic Cough. Cardiovascular:Not Present- Chest Pain, Racing/skipping heartbeats, Difficulty Breathing Lying Down, Murmur, Swelling and Palpitations. Gastrointestinal:Present- Constipation. Not Present- Bloody Stool, Heartburn, Abdominal Pain, Vomiting, Nausea, Diarrhea, Difficulty Swallowing, Jaundice and Loss of appetitie. Female Genitourinary:Present- Urinary frequency. Not Present- Blood in Urine, Weak urinary stream, Discharge, Flank Pain, Incontinence, Painful Urination, Urgency, Urinary Retention and  Urinating at Night. Musculoskeletal:Present- Muscle Weakness, Joint Swelling and Joint Pain. Not Present- Muscle Pain, Back Pain, Morning Stiffness and Spasms. Neurological:Not Present- Tremor, Dizziness, Blackout spells, Paralysis, Difficulty with balance and Weakness. Psychiatric:Not Present- Insomnia.   Vitals Weight: 145 lb Height: 66 in Weight was reported by patient.  Height was reported by patient. Body Surface Area: 1.75 m Body Mass Index: 23.4 kg/m Pulse: 64 (Regular) Resp.: 14 (Unlabored) BP: 118/62 (Sitting, Right Arm, Standard)    Physical Exam The physical exam findings are as follows:   General Mental Status - Alert, cooperative and good historian. General Appearance- pleasant. Not in acute distress. Orientation- Oriented X3. Build & Nutrition- Well nourished and Well developed.   Head and Neck Head- normocephalic, atraumatic . Neck Global Assessment- bruit auscultated on the left (very faint) and supple. no bruit auscultated on the right.   Eye Pupil- Bilateral- Regular and Round. Note: wears glasses Motion- Bilateral- EOMI.   Chest and Lung Exam Auscultation: Breath sounds:- clear at anterior chest wall and - clear at posterior chest wall. Adventitious sounds:- No Adventitious sounds.   Cardiovascular Auscultation:Rhythm- Regular rate and rhythm. Heart Sounds- S1 WNL and S2 WNL. Murmurs & Other Heart Sounds:Auscultation of the heart reveals - No Murmurs.   Abdomen Palpation/Percussion:Tenderness- Abdomen is non-tender to palpation. Rigidity (guarding)- Abdomen is soft. Auscultation:Auscultation of the abdomen reveals - Bowel sounds normal.   Female Genitourinary Not done, not pertinent to present illness  Musculoskeletal On exam, well-developed female, alert and oriented, in no apparent distress. Right knee shows trace effusion. Range is about 5-115. There is marked crepitus on ROM. There is  tenderness, lateral greater than medial. No instability. She has a varus deformity.  Radiographs: x-rays again demonstrating bone on bone arthritis in the lateral and patellofemoral compartments with varus deformity.  Assessment & Plan Osteoarthritis, knee (715.96) Impression: Right Knee  Note: Plan is to to undergo a Right Total Knee Replacement by Dr. Lequita Halt.  Plan is to go home with sister.  PCP - Dr. Laurann Montana - Patient has been seen preoperatively and felt to be stable for surgery. Cardiology - Dr. Donato Schultz - Patient has undergone a recent stress and was found to be low risk and no ischemia and felt that she could proceed with surgery.  Signed electronically by Roberts Gaudy, PA-C

## 2012-01-25 NOTE — Anesthesia Postprocedure Evaluation (Signed)
  Anesthesia Post-op Note  Patient: Kristine Holden  Procedure(s) Performed: Procedure(s) (LRB): TOTAL KNEE ARTHROPLASTY (Right)  Patient Location: PACU  Anesthesia Type: General  Level of Consciousness: awake and alert   Airway and Oxygen Therapy: Patient Spontanous Breathing  Post-op Pain: mild  Post-op Assessment: Post-op Vital signs reviewed, Patient's Cardiovascular Status Stable, Respiratory Function Stable, Patent Airway and No signs of Nausea or vomiting  Last Vitals:  Filed Vitals:   01/25/12 1345  BP: 165/58  Pulse: 43  Temp:   Resp: 13    Post-op Vital Signs: stable   Complications: No apparent anesthesia complications

## 2012-01-26 ENCOUNTER — Encounter (HOSPITAL_COMMUNITY): Payer: Self-pay | Admitting: Orthopedic Surgery

## 2012-01-26 DIAGNOSIS — D62 Acute posthemorrhagic anemia: Secondary | ICD-10-CM | POA: Diagnosis not present

## 2012-01-26 LAB — CBC
Hemoglobin: 10.2 g/dL — ABNORMAL LOW (ref 12.0–15.0)
MCHC: 34.1 g/dL (ref 30.0–36.0)
RBC: 3.31 MIL/uL — ABNORMAL LOW (ref 3.87–5.11)
WBC: 13 10*3/uL — ABNORMAL HIGH (ref 4.0–10.5)

## 2012-01-26 LAB — BASIC METABOLIC PANEL
Chloride: 103 mEq/L (ref 96–112)
GFR calc non Af Amer: 85 mL/min — ABNORMAL LOW (ref >90)
Glucose, Bld: 167 mg/dL — ABNORMAL HIGH (ref 70–99)
Potassium: 4.2 mEq/L (ref 3.5–5.1)
Sodium: 135 mEq/L (ref 135–145)

## 2012-01-26 MED ORDER — RIVAROXABAN 10 MG PO TABS: 10.0000 mg | ORAL_TABLET | Freq: Every day | ORAL | Status: DC

## 2012-01-26 MED ORDER — OXYCODONE HCL 5 MG PO TABS: 5.0000 mg | ORAL_TABLET | ORAL | Status: DC | PRN

## 2012-01-26 MED ORDER — METHOCARBAMOL 500 MG PO TABS: 500.0000 mg | ORAL_TABLET | Freq: Four times a day (QID) | ORAL | Status: DC | PRN

## 2012-01-26 NOTE — Progress Notes (Signed)
   Subjective: 1 Day Post-Op Procedure(s) (LRB): TOTAL KNEE ARTHROPLASTY (Right) Patient reports pain as mild and moderate.   Patient seen in rounds with Dr. Lequita Halt. Patient is well, and has had no acute complaints or problems We will start therapy today.  Plan is to go Home after hospital stay.  Objective: Vital signs in last 24 hours: Temp:  [97.4 F (36.3 C)-98.4 F (36.9 C)] 98.3 F (36.8 C) (12/10 0500) Pulse Rate:  [42-74] 51  (12/10 0500) Resp:  [9-18] 16  (12/10 0500) BP: (115-167)/(53-79) 160/63 mmHg (12/10 0500) SpO2:  [96 %-100 %] 98 % (12/10 0500) Weight:  [63.504 kg (140 lb)] 63.504 kg (140 lb) (12/09 1430)  Intake/Output from previous day:  Intake/Output Summary (Last 24 hours) at 01/26/12 0840 Last data filed at 01/26/12 8295  Gross per 24 hour  Intake   3185 ml  Output   2255 ml  Net    930 ml    Intake/Output this shift: UOP 450 since MN +930  Labs:  [facility] 01/26/12 0446  HGB 10.2*    Basename 01/26/12 0446  WBC 13.0*  RBC 3.31*  HCT 29.9*  PLT 157    Basename 01/26/12 0446  NA 135  K 4.2  CL 103  CO2 26  BUN 10  CREATININE 0.56  GLUCOSE 167*  CALCIUM 10.0   No results found for this basename: LABPT:2,INR:2 in the last 72 hours  EXAM General - Patient is Alert, Appropriate and Oriented Extremity - Neurovascular intact Sensation intact distally Dorsiflexion/Plantar flexion intact Dressing - dressing C/D/I Motor Function - intact, moving foot and toes well on exam.  Hemovac pulled without difficulty.  Past Medical History  Diagnosis Date  . Hypothyroidism   . Arthritis     Assessment/Plan: 1 Day Post-Op Procedure(s) (LRB): TOTAL KNEE ARTHROPLASTY (Right) Principal Problem:  *OA (osteoarthritis) of knee Active Problems:  Postop Acute blood loss anemia  Estimated Body mass index is 22.94 kg/(m^2) as calculated from the following:   Height as of this encounter: 5' 5.5"(1.664 m).   Weight as of this encounter: 140  lb(63.504 kg). Advance diet Up with therapy Plan for discharge tomorrow  DVT Prophylaxis - Xarelto Weight-Bearing as tolerated to right leg No vaccines. D/C O2 and Pulse OX and try on Room Air  Jerimah Witucki 01/26/2012, 8:40 AM

## 2012-01-26 NOTE — Care Management Note (Addendum)
    Page 1 of 2   01/28/2012     4:14:58 PM   CARE MANAGEMENT NOTE 01/28/2012  Patient:  Kristine Holden, Kristine Holden   Account Number:  [id number]  Date Initiated:  01/26/2012  Documentation initiated by:  Colleen Can  Subjective/Objective Assessment:   dx total rt knee replacemnt     Action/Plan:   CM SPOKE WITH PATIENT. pLANS ARE FOR PT TO RETURN TO HER HOME IN [place], [place] where her sister will be caregiver. She already has RW. Wants agency in network   Anticipated DC Date:  01/28/2012   Anticipated DC Plan:  HOME W HOME HEALTH SERVICES  In-house referral  Clinical Social Worker      DC Associate Professor  CM consult      [facility] Choice  HOME HEALTH   Choice offered to / List presented to:  C-1 Patient   DME arranged  NA      DME agency  NA     HH arranged  HH-2 PT      HH agency  [facility]   Status of service:  Completed, signed off Medicare Important Message given?  NA - LOS <3 / Initial given by admissions (If response is "NO", the following Medicare IM given date fields will be blank) Date Medicare IM given:   Date Additional Medicare IM given:    Discharge Disposition:  HOME W HOME HEALTH SERVICES  Per UR Regulation:    If discussed at Long Length of Stay Meetings, dates discussed:    Comments:  01/28/2012 Colleen Can BSN RN CCM [phone] Pt for discharge today. [facility] notified of patient discharge. Services for [facility] services will start with 48hrs of discharge. NO DME needed. Pt given contact information for [facility] agency.  01/26/2012 Colleen Can BSN RN CCM [phone] [facility] called; per intake-Sarah services for [facility] pt can be provided. HHPT ordrs, op note, H&P, face sheet faxed to [phone] with confirmation. CM will follow.

## 2012-01-26 NOTE — Evaluation (Signed)
Physical Therapy Evaluation Patient Details Name: MIKENZI RAYSOR MRN: [id number] DOB: 04/08/1927 Today's Date: 01/26/2012 Time: 0939-1000 PT Time Calculation (min): 21 min  PT Assessment / Plan / Recommendation Clinical Impression  Pt s/p R TKR. Pt reports she lives alone however will be having sister come stay with her to assist.  Pt would benefit from acute PT services in order to improve independence with transfers, ambulation and stairs to prepare for d/c home with sister to assist.    PT Assessment  Patient needs continued PT services    Follow Up Recommendations  Home health PT    Does the patient have the potential to tolerate intense rehabilitation      Barriers to Discharge        Equipment Recommendations  None recommended by PT    Recommendations for Other Services     Frequency 7X/week    Precautions / Restrictions Precautions Precautions: Knee Required Braces or Orthoses: Knee Immobilizer - Right Knee Immobilizer - Right: Discontinue once straight leg raise with < 10 degree lag Restrictions Weight Bearing Restrictions: No RLE Weight Bearing: Weight bearing as tolerated   Pertinent Vitals/Pain Pt reports no pain at rest and <5/10 R knee after mobility.  Premedicated, repositioned      Mobility  Bed Mobility Bed Mobility: Supine to Sit Supine to Sit: 4: Min assist Details for Bed Mobility Assistance: verbal cues for technique, assist for R LE Transfers Transfers: Stand to Sit;Sit to Stand Sit to Stand: 4: Min assist;From chair/3-in-1;From bed Stand to Sit: 4: Min guard;To chair/3-in-1 Details for Transfer Assistance: verbal cues for safe technique, assist to rise and steady Ambulation/Gait Ambulation/Gait Assistance: 4: Min assist Ambulation Distance (Feet): 80 Feet Assistive device: Rolling walker Ambulation/Gait Assistance Details: verbal cues for sequence, step length, RW distance, assist for occasional LOB Gait Pattern: Step-to  pattern;Antalgic;Decreased stride length Gait velocity: decreased    Shoulder Instructions     Exercises     PT Diagnosis: Difficulty walking;Acute pain  PT Problem List: Decreased strength;Decreased range of motion;Decreased mobility;Decreased knowledge of precautions;Pain;Decreased knowledge of use of DME PT Treatment Interventions: DME instruction;Gait training;Stair training;Patient/family education;Functional mobility training;Therapeutic activities;Therapeutic exercise   PT Goals Acute Rehab PT Goals PT Goal Formulation: With patient Time For Goal Achievement: 02/02/12 Potential to Achieve Goals: Good Pt will go Supine/Side to Sit: with modified independence PT Goal: Supine/Side to Sit - Progress: Goal set today Pt will go Sit to Stand: with modified independence PT Goal: Sit to Stand - Progress: Goal set today Pt will go Stand to Sit: with modified independence PT Goal: Stand to Sit - Progress: Goal set today Pt will Ambulate: with modified independence;with least restrictive assistive device;51 - 150 feet PT Goal: Ambulate - Progress: Goal set today Pt will Go Up / Down Stairs: 6-9 stairs;with rail(s) PT Goal: Up/Down Stairs - Progress: Goal set today Pt will Perform Home Exercise Program: with supervision, verbal cues required/provided PT Goal: Perform Home Exercise Program - Progress: Goal set today  Visit Information  Last PT Received On: 01/26/12 Assistance Needed: +1    Subjective Data  Subjective: Let's go to the bathroom first.   Prior Functioning  Home Living Lives With: Alone Available Help at Discharge: Family (sister) Type of Home: House Home Access: Stairs to enter Secretary/administrator of Steps: 6 Entrance Stairs-Rails: Can reach both Home Layout: Able to live on main level with bedroom/bathroom Home Adaptive Equipment: Walker - rolling;Crutches;Straight cane Prior Function Level of Independence: Independent Communication Communication: No  difficulties  Cognition  Overall Cognitive Status: Appears within functional limits for tasks assessed/performed Arousal/Alertness: Awake/alert Orientation Level: Appears intact for tasks assessed Behavior During Session: [facility] for tasks performed    Extremity/Trunk Assessment Right Upper Extremity Assessment RUE ROM/Strength/Tone: [facility] for tasks assessed Left Upper Extremity Assessment LUE ROM/Strength/Tone: WFL for tasks assessed Right Lower Extremity Assessment RLE ROM/Strength/Tone: Deficits RLE ROM/Strength/Tone Deficits: able to lift against gravity however knee lag, maintained KI Left Lower Extremity Assessment LLE ROM/Strength/Tone: WFL for tasks assessed   Balance    End of Session PT - End of Session Equipment Utilized During Treatment: Right knee immobilizer Activity Tolerance: Patient tolerated treatment well Patient left: in chair;with call bell/phone within reach CPM Right Knee CPM Right Knee: Off  GP     [place] Salamone,KATHrine E 01/26/2012, 12:20 PM Pager: [phone]

## 2012-01-26 NOTE — Discharge Instructions (Signed)
° °Dr. Frank Aluisio °Total Joint Specialist °[facility] °[address] °[place], [place] [address] °[phone] ° °TOTAL KNEE REPLACEMENT POSTOPERATIVE DIRECTIONS ° ° ° °Knee Rehabilitation, Guidelines Following Surgery  °Results after knee surgery are often greatly improved when you follow the exercise, range of motion and muscle strengthening exercises prescribed by your doctor. Safety measures are also important to protect the knee from further injury. Any time any of these exercises cause you to have increased pain or swelling in your knee joint, decrease the amount until you are comfortable again and slowly increase them. If you have problems or questions, call your caregiver or physical therapist for advice.  ° °HOME CARE INSTRUCTIONS  °Remove items at home which could result in a fall. This includes throw rugs or furniture in walking pathways.  °Continue medications as instructed at time of discharge. °You may have some home medications which will be placed on hold until you complete the course of blood thinner medication.  °You may start showering once you are discharged home but do not submerge the incision under water. Just pat the incision dry and apply a dry gauze dressing on daily. °Walk with walker as instructed.  °You may resume a sexual relationship in one month or when given the OK by  your doctor.  °· Use walker as long as suggested by your caregivers. °· Avoid periods of inactivity such as sitting longer than an hour when not asleep. This helps prevent blood clots.  °You may put full weight on your legs and walk as much as is comfortable.  °You may return to work once you are cleared by your doctor.  °Do not drive a car for 6 weeks or until released by you surgeon.  °· Do not drive while taking narcotics.  °Wear the elastic stockings for three weeks following surgery during the day but you may remove then at night. °Make sure you keep all of your appointments after your  operation with all of your doctors and caregivers. You should call the office at the above phone number and make an appointment for approximately two weeks after the date of your surgery. °Change the dressing daily and reapply a dry dressing each time. °Please pick up a stool softener and laxative for home use as long as you are requiring pain medications. °· Continue to use ice on the knee for pain and swelling from surgery. You may notice swelling that will progress down to the foot and ankle.  This is normal after surgery.  Elevate the leg when you are not up walking on it.   °It is important for you to complete the blood thinner medication as prescribed by your doctor. °· Continue to use the breathing machine which will help keep your temperature down.  It is common for your temperature to cycle up and down following surgery, especially at night when you are not up moving around and exerting yourself.  The breathing machine keeps your lungs expanded and your temperature down. ° °RANGE OF MOTION AND STRENGTHENING EXERCISES  °Rehabilitation of the knee is important following a knee injury or an operation. After just a few days of immobilization, the muscles of the thigh which control the knee become weakened and shrink (atrophy). Knee exercises are designed to build up the tone and strength of the thigh muscles and to improve knee motion. Often times heat used for twenty to thirty minutes before working out will loosen up your tissues and help with improving the   range of motion but do not use heat for the first two weeks following surgery. These exercises can be done on a training (exercise) mat, on the floor, on a table or on a bed. Use what ever works the best and is most comfortable for you Knee exercises include:  °Leg Lifts - While your knee is still immobilized in a splint or cast, you can do straight leg raises. Lift the leg to 60 degrees, hold for 3 sec, and slowly lower the leg. Repeat 10-20 times 2-3  times daily. Perform this exercise against resistance later as your knee gets better.  °Quad and Hamstring Sets - Tighten up the muscle on the front of the thigh (Quad) and hold for 5-10 sec. Repeat this 10-20 times hourly. Hamstring sets are done by pushing the foot backward against an object and holding for 5-10 sec. Repeat as with quad sets.  °A rehabilitation program following serious knee injuries can speed recovery and prevent re-injury in the future due to weakened muscles. Contact your doctor or a physical therapist for more information on knee rehabilitation.  ° °SKILLED REHAB INSTRUCTIONS: °If the patient is transferred to a skilled rehab facility following release from the hospital, a list of the current medications will be sent to the facility for the patient to continue.  When discharged from the skilled rehab facility, please have the facility set up the patient's Home Health Physical Therapy prior to being released. Also, the skilled facility will be responsible for providing the patient with their medications at time of release from the facility to include their pain medication, the muscle relaxants, and their blood thinner medication. If the patient is still at the rehab facility at time of the two week follow up appointment, the skilled rehab facility will also need to assist the patient in arranging follow up appointment in our office and any transportation needs. ° °MAKE SURE YOU:  °Understand these instructions.  °Will watch your condition.  °Will get help right away if you are not doing well or get worse.  ° ° °Pick up stool softner and laxative for home. °Do not submerge incision under water. °May shower. °Continue to use ice for pain and swelling from surgery. ° °Take Xarelto for two and a half more weeks, then discontinue Xarelto. ° ° ° ° ° ° ° ° ° °

## 2012-01-26 NOTE — Progress Notes (Signed)
Physical Therapy Treatment Note   01/26/12 1600  PT Visit Information  Last PT Received On 01/26/12  Assistance Needed +1  PT Time Calculation  PT Start Time 1445  PT Stop Time 1510  PT Time Calculation (min) 25 min  Subjective Data  Subjective What day is it?  (pt awoken from nap and disorientated x3)  Precautions  Precautions Knee  Required Braces or Orthoses Knee Immobilizer - Right  Knee Immobilizer - Right Discontinue once straight leg raise with < 10 degree lag  Restrictions  RLE Weight Bearing WBAT  Cognition  Overall Cognitive Status Appears within functional limits for tasks assessed/performed  Bed Mobility  Bed Mobility Sit to Supine  Sit to Supine 4: Min assist  Details for Bed Mobility Assistance verbal cues for technique, assist for R LE  Transfers  Transfers Stand to Sit;Sit to Stand  Sit to Stand 4: Min guard;With upper extremity assist;From chair/3-in-1  Stand to Sit 4: Min guard;With upper extremity assist;To bed  Details for Transfer Assistance verbal cues for safe technique  Ambulation/Gait  Ambulation/Gait Assistance 4: Min assist  Ambulation Distance (Feet) 100 Feet  Assistive device Rolling walker  Ambulation/Gait Assistance Details assist due to looking down at gown and LOB otherwise min/guard, verbal cues for step length and RW distance  Gait Pattern Step-to pattern;Antalgic;Decreased stride length  Gait velocity decreased  Exercises  Exercises Total Joint  Total Joint Exercises  Ankle Circles/Pumps AROM;20 reps;Both  Bear Stearns reps;Both  Gluteal Sets AROM;Both;20 reps  Towel Squeeze AROM;Both;15 reps  Short Arc Quad AROM;Right;15 reps  Heel Slides AAROM;Right;15 reps  Hip ABduction/ADduction AROM;Right;15 reps  Straight Leg Raises AROM;10 reps;Right  PT - End of Session  Equipment Utilized During Treatment Right knee immobilizer  Activity Tolerance Patient tolerated treatment well  Patient left in bed;with call bell/phone within reach   Nurse Communication Other (comment) (disorientated upon waking but better upon leaving room)  PT - Assessment/Plan  Comments on Treatment Session Pt ambulated in hallway and performed exercises.  Pt disorienated upon waking to participate thinking she was at home however stated at end of session, "gosh, I told you I was at home earlier didn't I?"  RN notified.  PT Plan Discharge plan remains appropriate;Frequency remains appropriate  Follow Up Recommendations Home health PT  PT equipment None recommended by PT  Acute Rehab PT Goals  PT Goal: Sit to Stand - Progress Progressing toward goal  PT Goal: Stand to Sit - Progress Progressing toward goal  PT Goal: Ambulate - Progress Progressing toward goal  PT Goal: Perform Home Exercise Program - Progress Progressing toward goal  PT General Charges  $$ ACUTE PT VISIT 1 Procedure  PT Treatments  $Gait Training 8-22 mins  $Therapeutic Exercise 8-22 mins    Zenovia Jarred, PT Pager: [phone]

## 2012-01-27 LAB — CBC
MCV: 92.2 fL (ref 78.0–100.0)
Platelets: 143 10*3/uL — ABNORMAL LOW (ref 150–400)
RDW: 13.7 % (ref 11.5–15.5)
WBC: 12.3 10*3/uL — ABNORMAL HIGH (ref 4.0–10.5)

## 2012-01-27 LAB — BASIC METABOLIC PANEL
Chloride: 101 mEq/L (ref 96–112)
Creatinine, Ser: 0.62 mg/dL (ref 0.50–1.10)
GFR calc Af Amer: >90 mL/min (ref >90)
Sodium: 135 mEq/L (ref 135–145)

## 2012-01-27 MED ORDER — HYDROCODONE-ACETAMINOPHEN 5-325 MG PO TABS
1.0000 | ORAL_TABLET | ORAL | Status: DC | PRN
  Administered 2012-01-27: 2 via ORAL
  Administered 2012-01-28 (×2): 1 via ORAL
  Filled 2012-01-27: qty 2
  Filled 2012-01-27 (×2): qty 1

## 2012-01-27 NOTE — Progress Notes (Signed)
   Subjective: 2 Days Post-Op Procedure(s) (LRB): TOTAL KNEE ARTHROPLASTY (Right) Patient reports pain as mild.   Patient seen in rounds for Dr. Lequita Halt.  Patient states that she got a little confused last night.  Will reduce the pain meds and keep today to ensure improvement. Patient is well, but has had some minor complaints of confusion last night but better this morning. Plan is to go Home after hospital stay.  Objective: Vital signs in last 24 hours: Temp:  [98.1 F (36.7 C)-99.9 F (37.7 C)] 99.9 F (37.7 C) (12/11 0523) Pulse Rate:  [52-60] 60  (12/11 0523) Resp:  [16-18] 18  (12/11 0523) BP: (130-145)/(53-85) 144/53 mmHg (12/11 0523) SpO2:  [94 %-98 %] 94 % (12/11 0523)  Intake/Output from previous day:  Intake/Output Summary (Last 24 hours) at 01/27/12 0844 Last data filed at 01/27/12 0524  Gross per 24 hour  Intake 1617.5 ml  Output    700 ml  Net  917.5 ml    Intake/Output this shift:    Labs:  Basename 01/27/12 0430 01/26/12 0446  HGB 10.0* 10.2*    Basename 01/27/12 0430 01/26/12 0446  WBC 12.3* 13.0*  RBC 3.21* 3.31*  HCT 29.6* 29.9*  PLT 143* 157    Basename 01/27/12 0430 01/26/12 0446  NA 135 135  K 5.3* 4.2  CL 101 103  CO2 26 26  BUN 13 10  CREATININE 0.62 0.56  GLUCOSE 135* 167*  CALCIUM 10.3 10.0   No results found for this basename: LABPT:2,INR:2 in the last 72 hours  EXAM General - Patient is Alert, Appropriate and Oriented this morning on rounds Extremity - Neurovascular intact Sensation intact distally Dorsiflexion/Plantar flexion intact Dressing/Incision - clean, dry, no drainage, healing Motor Function - intact, moving foot and toes well on exam.   Past Medical History  Diagnosis Date  . Hypothyroidism   . Arthritis     Assessment/Plan: 2 Days Post-Op Procedure(s) (LRB): TOTAL KNEE ARTHROPLASTY (Right) Principal Problem:  *OA (osteoarthritis) of knee Active Problems:  Postop Acute blood loss anemia  Estimated  Body mass index is 22.94 kg/(m^2) as calculated from the following:   Height as of this encounter: 5' 5.5"(1.664 m).   Weight as of this encounter: 140 lb(63.504 kg). Advance diet Up with therapy Plan for discharge tomorrow Discharge home with home health  DVT Prophylaxis - Xarelto Weight-Bearing as tolerated to right leg Reduce pain meds Recheck labs  Kristine Holden 01/27/2012, 8:44 AM

## 2012-01-27 NOTE — Evaluation (Signed)
Occupational Therapy Evaluation Patient Details Name: Kristine Holden MRN: [id number] DOB: 03-14-1927 Today's Date: 01/27/2012 Time: 5621-3086 OT Time Calculation (min): 18 min  OT Assessment / Plan / Recommendation Clinical Impression  This 78 year old female was admitted for R TKA.  She plans home with sister's help.  All education was complete.  Pt does not need any further OT.    OT Assessment       Follow Up Recommendations  No OT follow up    Barriers to Discharge      Equipment Recommendations  None recommended by OT    Recommendations for Other Services    Frequency       Precautions / Restrictions Precautions Precautions: Knee Required Braces or Orthoses: Knee Immobilizer - Right Knee Immobilizer - Right: Discontinue once straight leg raise with < 10 degree lag Restrictions Weight Bearing Restrictions: No RLE Weight Bearing: Weight bearing as tolerated   Pertinent Vitals/Pain R knee sore    ADL  Toilet Transfer: Simulated;Min guard Toilet Transfer Method: Sit to Barista:  (chair) Tub/Shower Transfer: Simulated;Min guard Tub/Shower Transfer Method: Science writer: Walk in shower (room has accessible shower; simulated ledge)  Handout given for shower sequence ADL Comments: sister will help with adls.  Pt has a reacher:  reviewed uses for adls    OT Diagnosis:    OT Problem List:   OT Treatment Interventions:     OT Goals    Visit Information  Last OT Received On: 01/27/12 Assistance Needed: +1    Subjective Data  Subjective: I have a 3:1 and also a raised seat Patient Stated Goal: home with sister helping   Prior Functioning     Home Living Lives With: Alone Available Help at Discharge: Family Bathroom Shower/Tub: Walk-in Stage manager: Standard Home Adaptive Equipment: Environmental consultant - rolling;Crutches;Straight cane Prior Function Level of Independence:  Independent Communication Communication: No difficulties         Vision/Perception     Cognition  Overall Cognitive Status: Appears within functional limits for tasks assessed/performed Arousal/Alertness: Awake/alert Orientation Level: Appears intact for tasks assessed Behavior During Session: [facility] for tasks performed    Extremity/Trunk Assessment Right Upper Extremity Assessment RUE ROM/Strength/Tone: [facility] for tasks assessed Left Upper Extremity Assessment LUE ROM/Strength/Tone: WFL for tasks assessed     Mobility Bed Mobility Bed Mobility: Supine to Sit Supine to Sit: 5: Supervision Details for Bed Mobility Assistance: verbal cues for technique with UEs assisting R LE Transfers Sit to Stand: 4: Min guard;With upper extremity assist;From chair/3-in-1;From bed Stand to Sit: 4: Min guard;With upper extremity assist;To chair/3-in-1 Details for Transfer Assistance: verbal cues for safe technique     Shoulder Instructions     Exercise    Balance     End of Session OT - End of Session Activity Tolerance: Patient tolerated treatment well Patient left: in chair;with call bell/phone within reach;with family/visitor present CPM Right Knee CPM Right Knee: Off  GO     Kristine Holden 01/27/2012, 12:53 PM Marica Otter, OTR/L [phone] 01/27/2012

## 2012-01-27 NOTE — Progress Notes (Signed)
Physical Therapy Treatment Note   01/27/12 1400  PT Visit Information  Last PT Received On 01/27/12  Assistance Needed +1  PT Time Calculation  PT Start Time 1359  PT Stop Time 1413  PT Time Calculation (min) 14 min  Subjective Data  Subjective I guess I'm ready.  Precautions  Precautions Knee  Required Braces or Orthoses Knee Immobilizer - Right  Knee Immobilizer - Right Discontinue once straight leg raise with < 10 degree lag  Restrictions  RLE Weight Bearing WBAT  Cognition  Overall Cognitive Status Appears within functional limits for tasks assessed/performed  Transfers  Transfers Stand to Sit;Sit to Stand  Sit to Stand 4: Min guard;With upper extremity assist;From chair/3-in-1  Stand to Sit 4: Min guard;With upper extremity assist;To chair/3-in-1  Details for Transfer Assistance verbal cues for safe technique  Ambulation/Gait  Ambulation/Gait Assistance 5: Supervision  Ambulation Distance (Feet) 160 Feet  Assistive device Rolling walker  Ambulation/Gait Assistance Details verbal cue for RW distance  Gait Pattern Step-to pattern;Antalgic;Decreased stride length  Gait velocity decreased  PT - End of Session  Equipment Utilized During Treatment Right knee immobilizer  Activity Tolerance Patient tolerated treatment well  Patient left in chair;with call bell/phone within reach  PT - Assessment/Plan  Comments on Treatment Session Pt ambulated again in hallway.  Pt reports sister will be assisting her home tomorrow so will try to plan for stairs with her present.  PT Plan Discharge plan remains appropriate;Frequency remains appropriate  Follow Up Recommendations Home health PT  PT equipment None recommended by PT  Acute Rehab PT Goals  PT Goal: Sit to Stand - Progress Progressing toward goal  PT Goal: Stand to Sit - Progress Progressing toward goal  PT Goal: Ambulate - Progress Progressing toward goal  PT General Charges  $$ ACUTE PT VISIT 1 Procedure  PT Treatments   $Gait Training 8-22 mins    Zenovia Jarred, PT Pager: [phone]

## 2012-01-27 NOTE — Progress Notes (Signed)
Physical Therapy Treatment Patient Details Name: Kristine Holden MRN: [id number] DOB: September 18, 1927 Today's Date: 01/27/2012 Time: 0934-1000 PT Time Calculation (min): 26 min  PT Assessment / Plan / Recommendation Comments on Treatment Session  Pt feeling better today and ambulated in hallway then returned to room and performed exercises in recliner.    Follow Up Recommendations  Home health PT     Does the patient have the potential to tolerate intense rehabilitation     Barriers to Discharge        Equipment Recommendations  None recommended by PT    Recommendations for Other Services    Frequency     Plan Discharge plan remains appropriate;Frequency remains appropriate    Precautions / Restrictions Precautions Precautions: Knee Required Braces or Orthoses: Knee Immobilizer - Right Knee Immobilizer - Right: Discontinue once straight leg raise with < 10 degree lag Restrictions Weight Bearing Restrictions: No RLE Weight Bearing: Weight bearing as tolerated   Pertinent Vitals/Pain 4/10 R knee with mobility, repositioned    Mobility  Bed Mobility Bed Mobility: Supine to Sit Supine to Sit: 5: Supervision Details for Bed Mobility Assistance: verbal cues for technique with UEs assisting R LE Transfers Transfers: Stand to Sit;Sit to Stand Sit to Stand: 4: Min guard;With upper extremity assist;From chair/3-in-1;From bed Stand to Sit: 4: Min guard;With upper extremity assist;To chair/3-in-1 Details for Transfer Assistance: verbal cues for safe technique Ambulation/Gait Ambulation/Gait Assistance: 4: Min guard Ambulation Distance (Feet): 100 Feet Assistive device: Rolling walker Ambulation/Gait Assistance Details: verbal cues for step length and RW distance Gait Pattern: Step-to pattern;Antalgic;Decreased stride length Gait velocity: decreased    Exercises Total Joint Exercises Ankle Circles/Pumps: AROM;20 reps;Both Quad Sets: AROM;20 reps;Both Gluteal Sets: AROM;Both;20  reps Short Arc Quad: AROM;Right;15 reps Heel Slides: AAROM;Right;15 reps Hip ABduction/ADduction: AROM;Right;15 reps Straight Leg Raises: AAROM;10 reps;Right   PT Diagnosis:    PT Problem List:   PT Treatment Interventions:     PT Goals Acute Rehab PT Goals PT Goal: Supine/Side to Sit - Progress: Progressing toward goal PT Goal: Sit to Stand - Progress: Progressing toward goal PT Goal: Stand to Sit - Progress: Progressing toward goal PT Goal: Ambulate - Progress: Progressing toward goal PT Goal: Perform Home Exercise Program - Progress: Progressing toward goal  Visit Information  Last PT Received On: 01/27/12 Assistance Needed: +1    Subjective Data  Subjective: I need to use the bathroom.   Cognition  Overall Cognitive Status: Appears within functional limits for tasks assessed/performed    Balance     End of Session PT - End of Session Equipment Utilized During Treatment: Right knee immobilizer Activity Tolerance: Patient tolerated treatment well Patient left: in chair;with call bell/phone within reach CPM Right Knee CPM Right Knee: Off   GP     Donald Memoli,KATHrine E 01/27/2012, 12:38 PM Pager: [phone]

## 2012-01-28 LAB — CBC
HCT: 27 % — ABNORMAL LOW (ref 36.0–46.0)
Platelets: 138 10*3/uL — ABNORMAL LOW (ref 150–400)
RBC: 2.94 MIL/uL — ABNORMAL LOW (ref 3.87–5.11)
RDW: 13.7 % (ref 11.5–15.5)
WBC: 9.4 10*3/uL (ref 4.0–10.5)

## 2012-01-28 LAB — BASIC METABOLIC PANEL
BUN: 12 mg/dL (ref 6–23)
Chloride: 104 mEq/L (ref 96–112)
GFR calc Af Amer: >90 mL/min (ref >90)
Potassium: 4.2 mEq/L (ref 3.5–5.1)

## 2012-01-28 MED ORDER — RIVAROXABAN 10 MG PO TABS: 10.0000 mg | ORAL_TABLET | Freq: Every day | ORAL | Status: DC

## 2012-01-28 MED ORDER — HYDROCODONE-ACETAMINOPHEN 5-325 MG PO TABS: 1.0000 | ORAL_TABLET | ORAL | Status: DC | PRN

## 2012-01-28 MED ORDER — METHOCARBAMOL 500 MG PO TABS: 500.0000 mg | ORAL_TABLET | Freq: Four times a day (QID) | ORAL | Status: DC | PRN

## 2012-01-28 NOTE — Progress Notes (Signed)
Physical Therapy Treatment Note   01/28/12 1400  PT Visit Information  Last PT Received On 01/28/12  Assistance Needed +1  PT Time Calculation  PT Start Time 1345  PT Stop Time 1416  PT Time Calculation (min) 31 min  Subjective Data  Subjective Thank you for everything. I wish you were coming home with [place].  Precautions  Precautions Knee  Required Braces or Orthoses Knee Immobilizer - Right  Knee Immobilizer - Right Discontinue once straight leg raise with < 10 degree lag  Restrictions  RLE Weight Bearing WBAT  Cognition  Overall Cognitive Status Appears within functional limits for tasks assessed/performed  Bed Mobility  Bed Mobility Supine to Sit  Supine to Sit 5: Supervision  Details for Bed Mobility Assistance used L LE to assist R  Transfers  Transfers Stand to Sit;Sit to Stand  Sit to Stand 5: Supervision;With upper extremity assist;From bed  Stand to Sit 5: Supervision;With upper extremity assist;To chair/3-in-1  Details for Transfer Assistance one verbal cue for reaching back for chair to sit  Ambulation/Gait  Ambulation/Gait Assistance 5: Supervision  Ambulation Distance (Feet) 120 Feet  Assistive device Rolling walker  Ambulation/Gait Assistance Details verbal cue for step length and RW distance  Gait Pattern Step-to pattern;Antalgic;Decreased stride length  Gait velocity decreased  Exercises  Exercises Total Joint  Total Joint Exercises  Ankle Circles/Pumps AROM;20 reps;Both  Bear Stearns reps;Both  Towel Squeeze AROM;Both;15 reps  Short Arc Quad AROM;Right;15 reps  Heel Slides AAROM;Right;15 reps  Hip ABduction/ADduction AROM;Right;15 reps  Straight Leg Raises AAROM;10 reps;Right  PT - End of Session  Equipment Utilized During Treatment Right knee immobilizer  Activity Tolerance Patient tolerated treatment well  Patient left in chair;with call bell/phone within reach;with family/visitor present  PT - Assessment/Plan  Comments on Treatment Session Pt  and sister educated in exercises and given handout.  Sister also educated on assisting pt don/doff KI.  Pt and sister feel ready for d/c and had no further questions/concerns.  PT Plan Discharge plan remains appropriate;Frequency remains appropriate  Acute Rehab PT Goals  PT Goal: Supine/Side to Sit - Progress Progressing toward goal  PT Goal: Sit to Stand - Progress Progressing toward goal  PT Goal: Stand to Sit - Progress Progressing toward goal  PT Goal: Ambulate - Progress Progressing toward goal  PT Goal: Perform Home Exercise Program - Progress Progressing toward goal  PT General Charges  $$ ACUTE PT VISIT 1 Procedure  PT Treatments  $Gait Training 8-22 mins  $Therapeutic Exercise 8-22 mins    Zenovia Jarred, PT Pager: [phone]

## 2012-01-28 NOTE — Progress Notes (Signed)
Physical Therapy Treatment Patient Details Name: BUFFI EWTON MRN: [id number] DOB: 04-22-1927 Today's Date: 01/28/2012 Time: 4098-1191 PT Time Calculation (min): 23 min  PT Assessment / Plan / Recommendation Comments on Treatment Session  Pt ambulated and performed stairs.  Pt started exercises but reported increased pain so did not finish.  Pt stated she received pills this morning but not sure about pain meds.  RN notified    Follow Up Recommendations  Home health PT     Does the patient have the potential to tolerate intense rehabilitation     Barriers to Discharge        Equipment Recommendations  None recommended by PT    Recommendations for Other Services    Frequency     Plan Discharge plan remains appropriate;Frequency remains appropriate    Precautions / Restrictions Precautions Precautions: Knee Required Braces or Orthoses: Knee Immobilizer - Right Knee Immobilizer - Right: Discontinue once straight leg raise with < 10 degree lag Restrictions RLE Weight Bearing: Weight bearing as tolerated   Pertinent Vitals/Pain 8/10 R knee pain after ambulation, repositioned in supine, notified RN to check on pain meds     Mobility  Bed Mobility Bed Mobility: Supine to Sit;Sit to Supine Supine to Sit: 5: Supervision Sit to Supine: 5: Supervision Details for Bed Mobility Assistance: used L LE to assist R Transfers Transfers: Stand to Sit;Sit to Stand Sit to Stand: 5: Supervision;With upper extremity assist;From bed Stand to Sit: 5: Supervision;With upper extremity assist;To bed Details for Transfer Assistance: verbal cues for safe technique Ambulation/Gait Ambulation/Gait Assistance: 5: Supervision Ambulation Distance (Feet): 120 Feet Assistive device: Rolling walker Ambulation/Gait Assistance Details: verbal cue for step length and RW distance Gait Pattern: Step-to pattern;Antalgic;Decreased stride length Gait velocity: decreased Stairs: Yes Stairs Assistance: 4:  Min guard Stairs Assistance Details (indicate cue type and reason): verbal cues for technique and sequence, sister present and educated on safe technique as well Stair Management Technique: One rail Left;Step to pattern;With crutches Number of Stairs: 2  (x2)    Exercises Total Joint Exercises Ankle Circles/Pumps: AROM;20 reps;Both Quad Sets: AROM;20 reps;Both   PT Diagnosis:    PT Problem List:   PT Treatment Interventions:     PT Goals Acute Rehab PT Goals PT Goal: Supine/Side to Sit - Progress: Progressing toward goal PT Goal: Sit to Stand - Progress: Progressing toward goal PT Goal: Stand to Sit - Progress: Progressing toward goal PT Goal: Ambulate - Progress: Progressing toward goal PT Goal: Up/Down Stairs - Progress: Progressing toward goal  Visit Information  Last PT Received On: 01/28/12 Assistance Needed: +1    Subjective Data  Subjective: It's really hurting this morning.   Cognition  Overall Cognitive Status: Appears within functional limits for tasks assessed/performed    Balance     End of Session PT - End of Session Equipment Utilized During Treatment: Right knee immobilizer Activity Tolerance: Patient limited by fatigue;Patient limited by pain Patient left: in bed;with call bell/phone within reach;with family/visitor present   GP     Toshiyuki Fredell,KATHrine E 01/28/2012, 12:03 PM Pager: [phone]

## 2012-01-28 NOTE — Progress Notes (Signed)
   Subjective: 3 Days Post-Op Procedure(s) (LRB): TOTAL KNEE ARTHROPLASTY (Right) Patient reports pain as mild.   Patient seen in rounds with Dr. Lequita Halt. Patient is well, and has had no acute complaints or problems Patient is ready to go home today.  Objective: Vital signs in last 24 hours: Temp:  [97.6 F (36.4 C)-99.2 F (37.3 C)] 99.2 F (37.3 C) (12/12 0555) Pulse Rate:  [55-68] 64  (12/12 0555) Resp:  [16-18] 16  (12/12 0555) BP: (108-142)/(51-62) 142/62 mmHg (12/12 0555) SpO2:  [94 %-98 %] 95 % (12/12 0555)  Intake/Output from previous day:  Intake/Output Summary (Last 24 hours) at 01/28/12 0913 Last data filed at 01/28/12 0818  Gross per 24 hour  Intake   1080 ml  Output      1 ml  Net   1079 ml    Intake/Output this shift: Total I/O In: 360 [P.O.:360] Out: -   Labs:  Basename 01/28/12 0422 01/27/12 0430 01/26/12 0446  HGB 9.0* 10.0* 10.2*    Basename 01/28/12 0422 01/27/12 0430  WBC 9.4 12.3*  RBC 2.94* 3.21*  HCT 27.0* 29.6*  PLT 138* 143*    Basename 01/28/12 0422 01/27/12 0430  NA 136 135  K 4.2 5.3*  CL 104 101  CO2 26 26  BUN 12 13  CREATININE 0.56 0.62  GLUCOSE 140* 135*  CALCIUM 9.8 10.3   No results found for this basename: LABPT:2,INR:2 in the last 72 hours  EXAM: General - Patient is Alert, Appropriate and Oriented Extremity - Neurovascular intact Sensation intact distally Dorsiflexion/Plantar flexion intact No cellulitis present Incision - clean, dry, no drainage, healing Motor Function - intact, moving foot and toes well on exam.   Assessment/Plan: 3 Days Post-Op Procedure(s) (LRB): TOTAL KNEE ARTHROPLASTY (Right) Procedure(s) (LRB): TOTAL KNEE ARTHROPLASTY (Right) Past Medical History  Diagnosis Date  . Hypothyroidism   . Arthritis    Principal Problem:  *OA (osteoarthritis) of knee Active Problems:  Postop Acute blood loss anemia  Estimated Body mass index is 22.94 kg/(m^2) as calculated from the following:  Height as of this encounter: 5' 5.5"(1.664 m).   Weight as of this encounter: 140 lb(63.504 kg). Up with therapy Discharge home with home health Diet - Cardiac diet Follow up - in 2 weeks on December 23rd. Activity - WBAT Disposition - Home Condition Upon Discharge - Good D/C Meds - See DC Summary DVT Prophylaxis - Xarelto  Haydin Calandra 01/28/2012, 9:13 AM

## 2012-01-28 NOTE — Progress Notes (Signed)
CARE MANAGEMENT NOTE 01/28/2012  Patient:  FARRON, LAFOND   Account Number:  [id number]  Date Initiated:  01/26/2012  Documentation initiated by:  Colleen Can  Subjective/Objective Assessment:   dx total rt knee replacemnt     Action/Plan:   CM SPOKE WITH PATIENT. pLANS ARE FOR PT TO RETURN TO HER HOME IN [place], [place] where her sister will be caregiver. She already has RW. Wants agency in network   Anticipated DC Date:  01/28/2012   Anticipated DC Plan:  HOME W HOME HEALTH SERVICES  In-house referral  Clinical Social Worker      DC Associate Professor  CM consult      [facility] Choice  HOME HEALTH   Choice offered to / List presented to:  C-1 Patient   DME arranged  NA      DME agency  NA     HH arranged  HH-2 PT      HH agency  [facility]   Status of service:  Completed, signed off Medicare Important Message given?  NA - LOS <3 / Initial given by admissions (If response is "NO", the following Medicare IM given date fields will be blank) Date Medicare IM given:   Date Additional Medicare IM given:    Discharge Disposition:  HOME W HOME HEALTH SERVICES  Comments:  01/28/2012 Colleen Can BSN RN CCM [phone] Pt for discharge today. [facility] notified of patient discharge. Services for [facility] services will start with 48hrs of discharge. NO DME needed. Pt given contact information for [facility] agency.

## 2012-02-08 NOTE — Discharge Summary (Signed)
Physician Discharge Summary   Patient ID: Kristine Holden MRN: [id number] DOB/AGE: 1927-07-04 78 y.o.  Admit date: 01/25/2012 Discharge date: 01/28/2012  Primary Diagnosis: Osteoarthritis Right knee   Admission Diagnoses:  Past Medical History  Diagnosis Date  . Hypothyroidism   . Arthritis    Discharge Diagnoses:   Principal Problem:  *OA (osteoarthritis) of knee Active Problems:  Postop Acute blood loss anemia  Estimated Body mass index is 22.94 kg/(m^2) as calculated from the following:   Height as of this encounter: 5' 5.5"(1.664 m).   Weight as of this encounter: 140 lb(63.504 kg).  Classification of overweight in adults according to BMI (WHO, 1998)   Procedure:  Procedure(s) (LRB): TOTAL KNEE ARTHROPLASTY (Right)   Consults: None  HPI: Kristine Holden is a 78 y.o. year old female with end stage OA of her right knee with progressively worsening pain and dysfunction. She has constant pain, with activity and at rest and significant functional deficits with difficulties even with ADLs. She has had extensive non-op management including analgesics, injections of cortisone and viscosupplements, and home exercise program, but remains in significant pain with significant dysfunction.Radiographs show bone on bone arthritis lateral and patellofemoral with valgus deformity. She presents now for right Total Knee Arthroplasty.  Laboratory Data: Admission on 01/25/2012, Discharged on 01/28/2012  Component Date Value Range Status  . WBC 01/26/2012 13.0* 4.0 - 10.5 K/uL Final  . RBC 01/26/2012 3.31* 3.87 - 5.11 MIL/uL Final  . Hemoglobin 01/26/2012 10.2* 12.0 - 15.0 g/dL Final  . HCT 40/98/1191 29.9* 36.0 - 46.0 % Final  . MCV 01/26/2012 90.3  78.0 - 100.0 fL Final  . MCH 01/26/2012 30.8  26.0 - 34.0 pg Final  . MCHC 01/26/2012 34.1  30.0 - 36.0 g/dL Final  . RDW 47/82/9562 13.1  11.5 - 15.5 % Final  . Platelets 01/26/2012 157  150 - 400 K/uL Final  . Sodium 01/26/2012 135  135 -  145 mEq/L Final  . Potassium 01/26/2012 4.2  3.5 - 5.1 mEq/L Final  . Chloride 01/26/2012 103  96 - 112 mEq/L Final  . CO2 01/26/2012 26  19 - 32 mEq/L Final  . Glucose, Bld 01/26/2012 167* 70 - 99 mg/dL Final  . BUN 13/09/6576 10  6 - 23 mg/dL Final  . Creatinine, Ser 01/26/2012 0.56  0.50 - 1.10 mg/dL Final  . Calcium 46/96/2952 10.0  8.4 - 10.5 mg/dL Final  . GFR calc non Af Amer 01/26/2012 85* >90 mL/min Final  . GFR calc Af Amer 01/26/2012 >90  >90 mL/min Final   Comment:                                 The eGFR has been calculated                          using the CKD EPI equation.                          This calculation has not been                          validated in all clinical                          situations.  eGFR's persistently                          <90 mL/min signify                          possible Chronic Kidney Disease.  . WBC 01/27/2012 12.3* 4.0 - 10.5 K/uL Final  . RBC 01/27/2012 3.21* 3.87 - 5.11 MIL/uL Final  . Hemoglobin 01/27/2012 10.0* 12.0 - 15.0 g/dL Final  . HCT 40/98/1191 29.6* 36.0 - 46.0 % Final  . MCV 01/27/2012 92.2  78.0 - 100.0 fL Final  . MCH 01/27/2012 31.2  26.0 - 34.0 pg Final  . MCHC 01/27/2012 33.8  30.0 - 36.0 g/dL Final  . RDW 47/82/9562 13.7  11.5 - 15.5 % Final  . Platelets 01/27/2012 143* 150 - 400 K/uL Final  . Sodium 01/27/2012 135  135 - 145 mEq/L Final  . Potassium 01/27/2012 5.3* 3.5 - 5.1 mEq/L Final   Comment: RESULT REPEATED AND VERIFIED                          HEMOLYSIS AT THIS LEVEL MAY AFFECT RESULT                          DELTA CHECK NOTED  . Chloride 01/27/2012 101  96 - 112 mEq/L Final  . CO2 01/27/2012 26  19 - 32 mEq/L Final  . Glucose, Bld 01/27/2012 135* 70 - 99 mg/dL Final  . BUN 13/09/6576 13  6 - 23 mg/dL Final  . Creatinine, Ser 01/27/2012 0.62  0.50 - 1.10 mg/dL Final  . Calcium 46/96/2952 10.3  8.4 - 10.5 mg/dL Final  . GFR calc non Af Amer 01/27/2012 82* >90 mL/min  Final  . GFR calc Af Amer 01/27/2012 >90  >90 mL/min Final   Comment:                                 The eGFR has been calculated                          using the CKD EPI equation.                          This calculation has not been                          validated in all clinical                          situations.                          eGFR's persistently                          <90 mL/min signify                          possible Chronic Kidney Disease.  . WBC 01/28/2012 9.4  4.0 - 10.5 K/uL Final  . RBC 01/28/2012 2.94* 3.87 - 5.11 MIL/uL Final  . Hemoglobin 01/28/2012 9.0* 12.0 -  15.0 g/dL Final  . HCT 16/11/9602 27.0* 36.0 - 46.0 % Final  . MCV 01/28/2012 91.8  78.0 - 100.0 fL Final  . MCH 01/28/2012 30.6  26.0 - 34.0 pg Final  . MCHC 01/28/2012 33.3  30.0 - 36.0 g/dL Final  . RDW 54/10/8117 13.7  11.5 - 15.5 % Final  . Platelets 01/28/2012 138* 150 - 400 K/uL Final  . Sodium 01/28/2012 136  135 - 145 mEq/L Final  . Potassium 01/28/2012 4.2  3.5 - 5.1 mEq/L Final   DELTA CHECK NOTED  . Chloride 01/28/2012 104  96 - 112 mEq/L Final  . CO2 01/28/2012 26  19 - 32 mEq/L Final  . Glucose, Bld 01/28/2012 140* 70 - 99 mg/dL Final  . BUN 14/78/2956 12  6 - 23 mg/dL Final  . Creatinine, Ser 01/28/2012 0.56  0.50 - 1.10 mg/dL Final  . Calcium 21/30/8657 9.8  8.4 - 10.5 mg/dL Final  . GFR calc non Af Amer 01/28/2012 85* >90 mL/min Final  . GFR calc Af Amer 01/28/2012 >90  >90 mL/min Final   Comment:                                 The eGFR has been calculated                          using the CKD EPI equation.                          This calculation has not been                          validated in all clinical                          situations.                          eGFR's persistently                          <90 mL/min signify                          possible Chronic Kidney Disease.  Hospital Outpatient Visit on 01/21/2012  Component Date Value Range  Status  . aPTT 01/21/2012 23* 24 - 37 seconds Final  . WBC 01/21/2012 5.5  4.0 - 10.5 K/uL Final  . RBC 01/21/2012 4.43  3.87 - 5.11 MIL/uL Final  . Hemoglobin 01/21/2012 13.5  12.0 - 15.0 g/dL Final  . HCT 84/69/6295 40.1  36.0 - 46.0 % Final  . MCV 01/21/2012 90.5  78.0 - 100.0 fL Final  . MCH 01/21/2012 30.5  26.0 - 34.0 pg Final  . MCHC 01/21/2012 33.7  30.0 - 36.0 g/dL Final  . RDW 28/41/3244 13.2  11.5 - 15.5 % Final  . Platelets 01/21/2012 208  150 - 400 K/uL Final  . Sodium 01/21/2012 138  135 - 145 mEq/L Final  . Potassium 01/21/2012 4.5  3.5 - 5.1 mEq/L Final  . Chloride 01/21/2012 104  96 - 112 mEq/L Final  . CO2 01/21/2012 26  19 - 32 mEq/L Final  . Glucose, Bld 01/21/2012 90  70 - 99  mg/dL Final  . BUN 16/11/9602 19  6 - 23 mg/dL Final  . Creatinine, Ser 01/21/2012 0.62  0.50 - 1.10 mg/dL Final  . Calcium 54/10/8117 11.4* 8.4 - 10.5 mg/dL Final  . Total Protein 01/21/2012 7.1  6.0 - 8.3 g/dL Final  . Albumin 14/78/2956 3.6  3.5 - 5.2 g/dL Final  . AST 21/30/8657 21  0 - 37 U/L Final  . ALT 01/21/2012 16  0 - 35 U/L Final  . Alkaline Phosphatase 01/21/2012 86  39 - 117 U/L Final  . Total Bilirubin 01/21/2012 0.4  0.3 - 1.2 mg/dL Final  . GFR calc non Af Amer 01/21/2012 82* >90 mL/min Final  . GFR calc Af Amer 01/21/2012 >90  >90 mL/min Final   Comment:                                 The eGFR has been calculated                          using the CKD EPI equation.                          This calculation has not been                          validated in all clinical                          situations.                          eGFR's persistently                          <90 mL/min signify                          possible Chronic Kidney Disease.  Marland Kitchen Prothrombin Time 01/21/2012 12.3  11.6 - 15.2 seconds Final  . INR 01/21/2012 0.92  0.00 - 1.49 Final  . ABO/RH(D) 01/21/2012 A POS   Final  . Antibody Screen 01/21/2012 NEG   Final  . Sample Expiration 01/21/2012  01/28/2012   Final  . Color, Urine 01/21/2012 AMBER* YELLOW Final   BIOCHEMICALS MAY BE AFFECTED BY COLOR  . APPearance 01/21/2012 CLOUDY* CLEAR Final  . Specific Gravity, Urine 01/21/2012 1.026  1.005 - 1.030 Final  . pH 01/21/2012 5.0  5.0 - 8.0 Final  . Glucose, UA 01/21/2012 NEGATIVE  NEGATIVE mg/dL Final  . Hgb urine dipstick 01/21/2012 NEGATIVE  NEGATIVE Final  . Bilirubin Urine 01/21/2012 NEGATIVE  NEGATIVE Final  . Ketones, ur 01/21/2012 NEGATIVE  NEGATIVE mg/dL Final  . Protein, ur 84/69/6295 NEGATIVE  NEGATIVE mg/dL Final  . Urobilinogen, UA 01/21/2012 0.2  0.0 - 1.0 mg/dL Final  . Nitrite 28/41/3244 NEGATIVE  NEGATIVE Final  . Leukocytes, UA 01/21/2012 SMALL* NEGATIVE Final  . MRSA, PCR 01/21/2012 POSITIVE* NEGATIVE Final   Comment: RESULT CALLED TO, READ BACK BY AND VERIFIED WITH:                          KATRINA ROBERTS [id number] @ 1500 BY J SCOTTON  . Staphylococcus aureus 01/21/2012 POSITIVE* NEGATIVE Final  Comment:                                 The Xpert SA Assay (FDA                          approved for NASAL specimens                          in patients over 64 years of age),                          is one component of                          a comprehensive surveillance                          program.  Test performance has                          been validated by [employer] for patients greater                          than or equal to 75 year old.                          It is not intended                          to diagnose infection nor to                          guide or monitor treatment.  . Squamous Epithelial / LPF 01/21/2012 FEW* RARE Final  . WBC, UA 01/21/2012 0-2  <3 WBC/hpf Final  . Bacteria, UA 01/21/2012 FEW* RARE Final  . Crystals 01/21/2012 CA OXALATE CRYSTALS* NEGATIVE Final  . Urine-Other 01/21/2012 MUCOUS PRESENT   Final  . ABO/RH(D) 01/21/2012 A POS   Final     X-Rays:No results found.  EKG:No orders  found for this or any previous visit.   Hospital Course: Kristine Holden is a 78 y.o. who was admitted to [facility]. They were brought to the operating room on 01/25/2012 and underwent Procedure(s): TOTAL KNEE ARTHROPLASTY.  Patient tolerated the procedure well and was later transferred to the recovery room and then to the orthopaedic floor for postoperative care.  They were given PO and IV analgesics for pain control following their surgery.  They were given 24 hours of postoperative antibiotics of  Anti-infectives     Start     Dose/Rate Route Frequency Ordered Stop   01/25/12 1800   ceFAZolin (ANCEF) IVPB 1 g/50 mL premix        1 g 100 mL/hr over 30 Minutes Intravenous Every 6 hours 01/25/12 1436 01/26/12 0131   01/25/12 1002   ceFAZolin (ANCEF) IVPB 2 g/50 mL premix        2 g 100 mL/hr over 30 Minutes Intravenous 60 min pre-op 01/25/12  1002 01/25/12 1142         and started on DVT prophylaxis in the form of Xarelto.   PT and OT were ordered for total joint protocol.  Discharge planning consulted to help with postop disposition and equipment needs.  Patient had a good night on the evening of surgery and started to get up OOB with therapy on day one walking 80 and 100 feet. Hemovac drain was pulled without difficulty.  Continued to work with therapy into day two walking 100 and 160 feet.  Dressing was changed on day two and the incision was healing well.  By day three, the patient had progressed with therapy and meeting their goals.  Incision was healing well.  Patient was seen in rounds and was ready to go home.   Discharge Medications: Prior to Admission medications   Medication Sig Start Date End Date Taking? Authorizing Provider  HYDROcodone-acetaminophen (NORCO/VICODIN) 5-325 MG per tablet Take 1-2 tablets by mouth every 4 (four) hours as needed. 01/28/12   Karel Turpen Julien Girt, PA  levothyroxine (SYNTHROID, LEVOTHROID) 75 MCG tablet Take 75 mcg by mouth daily before  breakfast.   Yes Historical Provider, MD  methocarbamol (ROBAXIN) 500 MG tablet Take 1 tablet (500 mg total) by mouth every 6 (six) hours as needed. 01/28/12   Vikrant Pryce Julien Girt, PA  rivaroxaban (XARELTO) 10 MG TABS tablet Take 1 tablet (10 mg total) by mouth daily with breakfast. Take Xarelto for two and a half more weeks, then discontinue Xarelto. 01/28/12   Ashten Sarnowski Julien Girt, PA    Diet: Cardiac diet Activity:WBAT Follow-up:in 2 weeks on December 23rd. Disposition - Home Discharged Condition: good   Discharge Orders    Future Orders Please Complete By Expires   Diet - low sodium heart healthy      Call MD / Call 911      Comments:   If you experience chest pain or shortness of breath, CALL 911 and be transported to the hospital emergency room.  If you develope a fever above 101 F, pus (white drainage) or increased drainage or redness at the wound, or calf pain, call your surgeon's office.   Discharge instructions      Comments:   Pick up stool softner and laxative for home. Do not submerge incision under water. May shower. Continue to use ice for pain and swelling from surgery. Hip precautions.  Total Hip Protocol.  Take Xarelto for two and a half more weeks, then discontinue Xarelto.   Constipation Prevention      Comments:   Drink plenty of fluids.  Prune juice may be helpful.  You may use a stool softener, such as Colace (over the counter) 100 mg twice a day.  Use MiraLax (over the counter) for constipation as needed.   Increase activity slowly as tolerated      Patient may shower      Comments:   You may shower without a dressing once there is no drainage.  Do not wash over the wound.  If drainage remains, do not shower until drainage stops.   Weight bearing as tolerated      Driving restrictions      Comments:   No driving until released by the physician.   Lifting restrictions      Comments:   No lifting until released by the physician.   TED hose       Comments:   Use stockings (TED hose) for 3 weeks on both leg(s).  You may remove them at  night for sleeping.   Change dressing      Comments:   Change dressing daily with sterile 4 x 4 inch gauze dressing and apply TED hose. Do not submerge the incision under water.   Do not put a pillow under the knee. Place it under the heel.      Do not sit on low chairs, stoools or toilet seats, as it may be difficult to get up from low surfaces          Medication List     As of 02/08/2012  8:17 AM    STOP taking these medications         CALCIUM-VITAMIN D PO      Coenzyme Q10 200 MG Tabs      ESTER-C PO      GRAPE SEED CR PO      MAGNESIUM-ZINC PO      OMEGA-3 KRILL OIL 300 MG Caps      RESVERATROL PO      SYSTANE 0.4-0.3 % Soln   Generic drug: Polyethyl Glycol-Propyl Glycol      TAKE these medications         HYDROcodone-acetaminophen 5-325 MG per tablet   Commonly known as: NORCO/VICODIN   Take 1-2 tablets by mouth every 4 (four) hours as needed.      levothyroxine 75 MCG tablet   Commonly known as: SYNTHROID, LEVOTHROID   Take 75 mcg by mouth daily before breakfast.      methocarbamol 500 MG tablet   Commonly known as: ROBAXIN   Take 1 tablet (500 mg total) by mouth every 6 (six) hours as needed.      rivaroxaban 10 MG Tabs tablet   Commonly known as: XARELTO   Take 1 tablet (10 mg total) by mouth daily with breakfast. Take Xarelto for two and a half more weeks, then discontinue Xarelto.           Follow-up Information    Follow up with Loanne Drilling, MD. Schedule an appointment as soon as possible for a visit on 02/08/2012.   Contact information:   [address], [address] [place] [address] [phone]          Signed: Patrica Duel 02/08/2012, 8:17 AM

## 2012-03-02 ENCOUNTER — Ambulatory Visit: Payer: Medicare Other | Attending: Orthopedic Surgery

## 2012-03-02 DIAGNOSIS — M25669 Stiffness of unspecified knee, not elsewhere classified: Secondary | ICD-10-CM | POA: Insufficient documentation

## 2012-03-02 DIAGNOSIS — M25569 Pain in unspecified knee: Secondary | ICD-10-CM | POA: Insufficient documentation

## 2012-03-02 DIAGNOSIS — R262 Difficulty in walking, not elsewhere classified: Secondary | ICD-10-CM | POA: Insufficient documentation

## 2012-03-02 DIAGNOSIS — IMO0001 Reserved for inherently not codable concepts without codable children: Secondary | ICD-10-CM | POA: Insufficient documentation

## 2012-03-02 DIAGNOSIS — Z96659 Presence of unspecified artificial knee joint: Secondary | ICD-10-CM | POA: Insufficient documentation

## 2012-03-04 ENCOUNTER — Ambulatory Visit: Payer: Medicare Other | Admitting: Physical Therapy

## 2012-03-07 ENCOUNTER — Ambulatory Visit: Payer: Medicare Other | Admitting: Physical Therapy

## 2012-03-11 ENCOUNTER — Ambulatory Visit: Payer: Medicare Other | Admitting: Physical Therapy

## 2012-03-14 ENCOUNTER — Ambulatory Visit: Payer: Medicare Other | Admitting: Physical Therapy

## 2012-03-17 ENCOUNTER — Ambulatory Visit: Payer: Medicare Other | Admitting: Physical Therapy

## 2012-03-21 ENCOUNTER — Ambulatory Visit: Payer: Medicare Other | Attending: Orthopedic Surgery | Admitting: Physical Therapy

## 2012-03-21 DIAGNOSIS — R262 Difficulty in walking, not elsewhere classified: Secondary | ICD-10-CM | POA: Insufficient documentation

## 2012-03-21 DIAGNOSIS — Z96659 Presence of unspecified artificial knee joint: Secondary | ICD-10-CM | POA: Insufficient documentation

## 2012-03-21 DIAGNOSIS — M25569 Pain in unspecified knee: Secondary | ICD-10-CM | POA: Insufficient documentation

## 2012-03-21 DIAGNOSIS — IMO0001 Reserved for inherently not codable concepts without codable children: Secondary | ICD-10-CM | POA: Insufficient documentation

## 2012-03-21 DIAGNOSIS — M25669 Stiffness of unspecified knee, not elsewhere classified: Secondary | ICD-10-CM | POA: Insufficient documentation

## 2012-03-24 ENCOUNTER — Ambulatory Visit: Payer: Medicare Other | Admitting: Physical Therapy

## 2012-03-28 ENCOUNTER — Ambulatory Visit: Payer: Medicare Other | Admitting: Physical Therapy

## 2012-03-31 ENCOUNTER — Ambulatory Visit: Payer: Medicare Other | Admitting: Physical Therapy

## 2012-04-04 ENCOUNTER — Ambulatory Visit: Payer: Medicare Other | Admitting: Physical Therapy

## 2012-11-28 ENCOUNTER — Ambulatory Visit (HOSPITAL_COMMUNITY): Payer: Medicare Other | Attending: Cardiology

## 2012-11-28 DIAGNOSIS — I658 Occlusion and stenosis of other precerebral arteries: Secondary | ICD-10-CM | POA: Insufficient documentation

## 2012-11-28 DIAGNOSIS — I6529 Occlusion and stenosis of unspecified carotid artery: Secondary | ICD-10-CM | POA: Insufficient documentation

## 2012-11-28 DIAGNOSIS — I10 Essential (primary) hypertension: Secondary | ICD-10-CM | POA: Insufficient documentation

## 2012-11-28 DIAGNOSIS — R0989 Other specified symptoms and signs involving the circulatory and respiratory systems: Secondary | ICD-10-CM | POA: Insufficient documentation

## 2012-12-06 ENCOUNTER — Telehealth: Payer: Self-pay | Admitting: Cardiology

## 2012-12-06 NOTE — Telephone Encounter (Signed)
Follow Up: ° ° ° ° °Pt returning your call. °

## 2012-12-07 ENCOUNTER — Ambulatory Visit: Payer: Medicare Other | Attending: Orthopedic Surgery

## 2012-12-07 DIAGNOSIS — R5381 Other malaise: Secondary | ICD-10-CM | POA: Insufficient documentation

## 2012-12-07 DIAGNOSIS — IMO0001 Reserved for inherently not codable concepts without codable children: Secondary | ICD-10-CM | POA: Insufficient documentation

## 2012-12-07 DIAGNOSIS — M25559 Pain in unspecified hip: Secondary | ICD-10-CM | POA: Insufficient documentation

## 2012-12-08 ENCOUNTER — Ambulatory Visit: Payer: Medicare Other | Admitting: Physical Therapy

## 2012-12-12 ENCOUNTER — Ambulatory Visit: Payer: Medicare Other

## 2012-12-12 ENCOUNTER — Ambulatory Visit: Payer: Medicare Other | Admitting: Physical Therapy

## 2012-12-13 ENCOUNTER — Other Ambulatory Visit: Payer: Self-pay | Admitting: Internal Medicine

## 2012-12-13 DIAGNOSIS — E041 Nontoxic single thyroid nodule: Secondary | ICD-10-CM

## 2012-12-14 ENCOUNTER — Ambulatory Visit: Payer: Medicare Other | Admitting: Physical Therapy

## 2012-12-15 ENCOUNTER — Other Ambulatory Visit: Payer: Self-pay | Admitting: Cardiology

## 2012-12-15 ENCOUNTER — Ambulatory Visit
Admission: RE | Admit: 2012-12-15 | Discharge: 2012-12-15 | Disposition: A | Discharge status: Home / Self Care | Payer: Medicare Other | Source: Ambulatory Visit | Attending: Internal Medicine | Admitting: Internal Medicine

## 2012-12-15 DIAGNOSIS — E041 Nontoxic single thyroid nodule: Secondary | ICD-10-CM

## 2012-12-15 NOTE — Telephone Encounter (Signed)
Left message for patient to call back  

## 2012-12-20 ENCOUNTER — Ambulatory Visit: Payer: Medicare Other | Attending: Orthopedic Surgery | Admitting: Physical Therapy

## 2012-12-20 DIAGNOSIS — R5381 Other malaise: Secondary | ICD-10-CM | POA: Insufficient documentation

## 2012-12-20 DIAGNOSIS — IMO0001 Reserved for inherently not codable concepts without codable children: Secondary | ICD-10-CM | POA: Insufficient documentation

## 2012-12-20 DIAGNOSIS — M25559 Pain in unspecified hip: Secondary | ICD-10-CM | POA: Insufficient documentation

## 2012-12-22 ENCOUNTER — Other Ambulatory Visit: Payer: Self-pay | Admitting: Internal Medicine

## 2012-12-22 ENCOUNTER — Ambulatory Visit: Payer: Medicare Other | Admitting: Physical Therapy

## 2012-12-22 DIAGNOSIS — E041 Nontoxic single thyroid nodule: Secondary | ICD-10-CM

## 2012-12-27 ENCOUNTER — Ambulatory Visit: Payer: Medicare Other | Admitting: Physical Therapy

## 2012-12-28 ENCOUNTER — Ambulatory Visit
Admission: RE | Admit: 2012-12-28 | Discharge: 2012-12-28 | Disposition: A | Discharge status: Home / Self Care | Payer: Medicare Other | Source: Ambulatory Visit | Attending: Internal Medicine | Admitting: Internal Medicine

## 2012-12-28 ENCOUNTER — Other Ambulatory Visit (HOSPITAL_COMMUNITY)
Admission: RE | Admit: 2012-12-28 | Discharge: 2012-12-28 | Disposition: A | Discharge status: Home / Self Care | Payer: Medicare Other | Source: Ambulatory Visit | Attending: Interventional Radiology | Admitting: Interventional Radiology

## 2012-12-28 DIAGNOSIS — E041 Nontoxic single thyroid nodule: Secondary | ICD-10-CM

## 2012-12-29 ENCOUNTER — Ambulatory Visit: Payer: Medicare Other | Admitting: Physical Therapy

## 2013-01-03 ENCOUNTER — Ambulatory Visit: Payer: Medicare Other

## 2013-01-05 ENCOUNTER — Ambulatory Visit: Payer: Medicare Other | Admitting: Physical Therapy

## 2013-01-10 ENCOUNTER — Encounter: Payer: Medicare Other | Admitting: Physical Therapy

## 2013-11-21 ENCOUNTER — Other Ambulatory Visit: Payer: Self-pay | Admitting: Internal Medicine

## 2013-11-21 DIAGNOSIS — E041 Nontoxic single thyroid nodule: Secondary | ICD-10-CM

## 2013-11-28 ENCOUNTER — Other Ambulatory Visit: Payer: Medicare Other

## 2013-11-29 ENCOUNTER — Ambulatory Visit
Admission: RE | Admit: 2013-11-29 | Discharge: 2013-11-29 | Disposition: A | Discharge status: Home / Self Care | Payer: Medicare Other | Source: Ambulatory Visit | Attending: Internal Medicine | Admitting: Internal Medicine

## 2013-11-29 DIAGNOSIS — E041 Nontoxic single thyroid nodule: Secondary | ICD-10-CM

## 2013-12-08 ENCOUNTER — Encounter (HOSPITAL_COMMUNITY): Payer: Medicare Other

## 2013-12-08 DIAGNOSIS — R0989 Other specified symptoms and signs involving the circulatory and respiratory systems: Secondary | ICD-10-CM

## 2013-12-11 ENCOUNTER — Ambulatory Visit: Payer: Medicare Other | Admitting: Cardiology

## 2014-01-04 ENCOUNTER — Encounter: Payer: Self-pay | Admitting: Cardiology

## 2014-01-04 ENCOUNTER — Ambulatory Visit (INDEPENDENT_AMBULATORY_CARE_PROVIDER_SITE_OTHER): Payer: Medicare Other | Admitting: Cardiology

## 2014-01-04 VITALS — BP 120/68 | HR 49 | Ht 66.0 in | Wt 136.0 lb

## 2014-01-04 DIAGNOSIS — R001 Bradycardia, unspecified: Secondary | ICD-10-CM

## 2014-01-04 DIAGNOSIS — M1712 Unilateral primary osteoarthritis, left knee: Secondary | ICD-10-CM

## 2014-01-04 DIAGNOSIS — I440 Atrioventricular block, first degree: Secondary | ICD-10-CM

## 2014-01-04 DIAGNOSIS — H9202 Otalgia, left ear: Secondary | ICD-10-CM

## 2014-01-04 NOTE — Patient Instructions (Signed)
The current medical regimen is effective;  continue present plan and medications.  Follow up in 1 year with Dr Skains.  You will receive a letter in the mail 2 months before you are due.  Please call us when you receive this letter to schedule your follow up appointment.  

## 2014-01-04 NOTE — Progress Notes (Signed)
[facility] [address], [place]  [address] Phone: [phone] Fax:  [phone]  Date:  01/04/2014   ID:  Kealohilani, Maiorino [birth year], MRN [id number]  PCP:  Vidal Schwalbe, MD   History of Present Illness: Kristine Holden is a 80 y.o. female with history of first-degree AV block, sinus bradycardia, carotid artery disease showing minimal plaque in March 2012, Holter monitor 3 second pause at 4 AM well sleeping with strong family history of CAD with her father and brothers here for follow-up. Had hip surgery previously.  Pain in left neck. Ear is painful to touch intermittently. No rashes.  Left knee osteoarthritis. Difficulty with her walking long distances.  She denies any chest pain, shortness of breath. No syncope.  Wt Readings from Last 3 Encounters:  01/04/14 136 lb (61.689 kg)  01/25/12 140 lb (63.504 kg)  01/21/12 140 lb 6.4 oz (63.685 kg)     Past Medical History  Diagnosis Date  . Hypothyroidism   . Arthritis   . Hypothyroidism     left thyroid  . OA (osteoarthritis)     hands  . Stenosed aortic valve     right carotid artery  . Lichen sclerosus   . Vitamin D deficiency   . Osteoporosis     Past Surgical History  Procedure Laterality Date  . Hand surgery  20years ago    open dislocation  . Abdominal hysterectomy  1974  . Appendectomy  1951  . Tonsillectomy  1942    x2  . Total knee arthroplasty  01/25/2012    Procedure: TOTAL KNEE ARTHROPLASTY;  Surgeon: Gearlean Alf, MD;  Location: [facility];  Service: Orthopedics;  Laterality: Right;    Current Outpatient Prescriptions  Medication Sig Dispense Refill  . alendronate (FOSAMAX) 70 MG tablet     . aspirin 81 MG tablet Take 81 mg by mouth daily.    . calcium-vitamin D 250-100 MG-UNIT per tablet Take 1 tablet by mouth 2 (two) times daily.    . ergocalciferol (VITAMIN D2) 50000 UNITS capsule Take 50,000 Units by mouth once a week.    . levothyroxine (SYNTHROID, LEVOTHROID) 75 MCG  tablet Take 75 mcg by mouth daily before breakfast.    . Multiple Vitamins-Minerals (MEGA MULTI WOMEN PO) Take by mouth.     No current facility-administered medications for this visit.    Allergies:    Allergies  Allergen Reactions  . Augmentin [Amoxicillin-Pot Clavulanate] Nausea And Vomiting  . Quinine Derivatives   . Penicillins Rash    Social History:  The patient  reports that she has never smoked. She has never used smokeless tobacco. She reports that she does not drink alcohol or use illicit drugs.   Family History  Problem Relation Age of Onset  . Heart attack Father   . Heart disease Father     ROS:  Please see the history of present illness.   No syncope, no shortness of breath, no chest pain. Positive Left knee pain.  All other systems reviewed and negative.   PHYSICAL EXAM: VS:  BP 120/68 mmHg  Pulse 49  Ht 5\' 6"  (1.676 m)  Wt 136 lb (61.689 kg)  BMI 21.96 kg/m2 Well nourished, well developed, in no acute distress HEENT: normal, [place]/AT, EOMI Neck: no JVD, normal carotid upstroke, no bruit, no left ear pain this morning. No rashes. Cardiac:  normal S1, S2; bradycardic regular; no murmur Lungs:  clear to auscultation bilaterally, no wheezing,  rhonchi or rales Abd: soft, nontender, no hepatomegaly, no bruits Ext: no edema, 2+ distal pulses Skin: warm and dry GU: deferred Neuro: no focal abnormalities noted, AAO x 3  EKG:  01/04/14-marked sinus bradycardia with first-degree AV block, 49 eats per minute, PR interval 282 ms. No significant change from prior. Previous PR interval was 266 ms. Prior heart rate was 47 bpm.    ASSESSMENT AND PLAN:  1. First-degree AV block/sinus bradycardia-overall doing well. No indication for pacemaker at this time. Continue to monitor for symptoms. Avoid AV nodal blocking agents. Discussed the physiology behind this finding. 2. Left knee osteoarthritis-difficulty for her to walk long distances. We will supply with a handicap  placard. 3. Intermittent left ear pain-sounds like a neuralgia-type discomfort. She will be discussing with Dr. Dema Severin soon. 4. One-year follow-up.  Signed, Candee Furbish, MD [facility]  01/04/2014 8:51 AM

## 2014-06-19 ENCOUNTER — Other Ambulatory Visit: Payer: Self-pay | Admitting: Family Medicine

## 2014-06-19 DIAGNOSIS — R0989 Other specified symptoms and signs involving the circulatory and respiratory systems: Secondary | ICD-10-CM

## 2014-06-21 ENCOUNTER — Ambulatory Visit
Admission: RE | Admit: 2014-06-21 | Discharge: 2014-06-21 | Disposition: A | Discharge status: Home / Self Care | Payer: Medicare Other | Source: Ambulatory Visit | Attending: Family Medicine | Admitting: Family Medicine

## 2014-06-21 DIAGNOSIS — R0989 Other specified symptoms and signs involving the circulatory and respiratory systems: Secondary | ICD-10-CM

## 2014-08-10 ENCOUNTER — Inpatient Hospital Stay (HOSPITAL_COMMUNITY)
Admission: EM | Admit: 2014-08-10 | Discharge: 2014-08-14 | DRG: 042 | Disposition: A | Discharge status: Home Health | Payer: Medicare Other | Attending: Family Medicine | Admitting: Family Medicine

## 2014-08-10 ENCOUNTER — Emergency Department (HOSPITAL_COMMUNITY): Payer: Medicare Other

## 2014-08-10 ENCOUNTER — Encounter (HOSPITAL_COMMUNITY): Payer: Self-pay | Admitting: *Deleted

## 2014-08-10 DIAGNOSIS — E559 Vitamin D deficiency, unspecified: Secondary | ICD-10-CM | POA: Diagnosis not present

## 2014-08-10 DIAGNOSIS — Z888 Allergy status to other drugs, medicaments and biological substances status: Secondary | ICD-10-CM

## 2014-08-10 DIAGNOSIS — Y92008 Other place in unspecified non-institutional (private) residence as the place of occurrence of the external cause: Secondary | ICD-10-CM

## 2014-08-10 DIAGNOSIS — Z79899 Other long term (current) drug therapy: Secondary | ICD-10-CM | POA: Diagnosis not present

## 2014-08-10 DIAGNOSIS — Z7982 Long term (current) use of aspirin: Secondary | ICD-10-CM

## 2014-08-10 DIAGNOSIS — M810 Age-related osteoporosis without current pathological fracture: Secondary | ICD-10-CM | POA: Diagnosis present

## 2014-08-10 DIAGNOSIS — S020XXA Fracture of vault of skull, initial encounter for closed fracture: Secondary | ICD-10-CM | POA: Diagnosis present

## 2014-08-10 DIAGNOSIS — W19XXXA Unspecified fall, initial encounter: Secondary | ICD-10-CM | POA: Diagnosis present

## 2014-08-10 DIAGNOSIS — S0101XA Laceration without foreign body of scalp, initial encounter: Secondary | ICD-10-CM | POA: Diagnosis present

## 2014-08-10 DIAGNOSIS — I495 Sick sinus syndrome: Secondary | ICD-10-CM | POA: Diagnosis present

## 2014-08-10 DIAGNOSIS — I350 Nonrheumatic aortic (valve) stenosis: Secondary | ICD-10-CM | POA: Diagnosis present

## 2014-08-10 DIAGNOSIS — I609 Nontraumatic subarachnoid hemorrhage, unspecified: Secondary | ICD-10-CM | POA: Diagnosis not present

## 2014-08-10 DIAGNOSIS — R55 Syncope and collapse: Secondary | ICD-10-CM | POA: Diagnosis present

## 2014-08-10 DIAGNOSIS — M19041 Primary osteoarthritis, right hand: Secondary | ICD-10-CM | POA: Diagnosis present

## 2014-08-10 DIAGNOSIS — Z959 Presence of cardiac and vascular implant and graft, unspecified: Secondary | ICD-10-CM

## 2014-08-10 DIAGNOSIS — S065X0A Traumatic subdural hemorrhage without loss of consciousness, initial encounter: Secondary | ICD-10-CM | POA: Diagnosis present

## 2014-08-10 DIAGNOSIS — Z96651 Presence of right artificial knee joint: Secondary | ICD-10-CM | POA: Diagnosis present

## 2014-08-10 DIAGNOSIS — I480 Paroxysmal atrial fibrillation: Secondary | ICD-10-CM | POA: Diagnosis present

## 2014-08-10 DIAGNOSIS — S0291XA Unspecified fracture of skull, initial encounter for closed fracture: Secondary | ICD-10-CM | POA: Diagnosis not present

## 2014-08-10 DIAGNOSIS — Z9841 Cataract extraction status, right eye: Secondary | ICD-10-CM | POA: Diagnosis not present

## 2014-08-10 DIAGNOSIS — Z880 Allergy status to penicillin: Secondary | ICD-10-CM

## 2014-08-10 DIAGNOSIS — E039 Hypothyroidism, unspecified: Secondary | ICD-10-CM | POA: Diagnosis present

## 2014-08-10 DIAGNOSIS — Z90710 Acquired absence of both cervix and uterus: Secondary | ICD-10-CM

## 2014-08-10 DIAGNOSIS — Z8781 Personal history of (healed) traumatic fracture: Secondary | ICD-10-CM | POA: Diagnosis present

## 2014-08-10 DIAGNOSIS — M19042 Primary osteoarthritis, left hand: Secondary | ICD-10-CM | POA: Diagnosis present

## 2014-08-10 DIAGNOSIS — I440 Atrioventricular block, first degree: Secondary | ICD-10-CM

## 2014-08-10 DIAGNOSIS — R001 Bradycardia, unspecified: Secondary | ICD-10-CM | POA: Diagnosis present

## 2014-08-10 DIAGNOSIS — Z23 Encounter for immunization: Secondary | ICD-10-CM

## 2014-08-10 DIAGNOSIS — L900 Lichen sclerosus et atrophicus: Secondary | ICD-10-CM | POA: Diagnosis not present

## 2014-08-10 DIAGNOSIS — I4891 Unspecified atrial fibrillation: Secondary | ICD-10-CM | POA: Diagnosis not present

## 2014-08-10 DIAGNOSIS — S0291XB Unspecified fracture of skull, initial encounter for open fracture: Secondary | ICD-10-CM

## 2014-08-10 DIAGNOSIS — Z9849 Cataract extraction status, unspecified eye: Secondary | ICD-10-CM

## 2014-08-10 HISTORY — DX: Sick sinus syndrome: I49.5

## 2014-08-10 HISTORY — DX: Paroxysmal atrial fibrillation: I48.0

## 2014-08-10 HISTORY — DX: Atrioventricular block, first degree: I44.0

## 2014-08-10 LAB — URINALYSIS, ROUTINE W REFLEX MICROSCOPIC
Bilirubin Urine: NEGATIVE
Glucose, UA: NEGATIVE mg/dL
Hgb urine dipstick: NEGATIVE
Ketones, ur: 15 mg/dL — AB
LEUKOCYTES UA: NEGATIVE
Nitrite: NEGATIVE
Protein, ur: NEGATIVE mg/dL
SPECIFIC GRAVITY, URINE: 1.009 (ref 1.005–1.030)
Urobilinogen, UA: 0.2 mg/dL (ref 0.0–1.0)
pH: 7.5 (ref 5.0–8.0)

## 2014-08-10 LAB — COMPREHENSIVE METABOLIC PANEL
ALK PHOS: 69 U/L (ref 38–126)
ALT: 22 U/L (ref 14–54)
AST: 30 U/L (ref 15–41)
Albumin: 4.1 g/dL (ref 3.5–5.0)
Anion gap: 10 (ref 5–15)
BUN: 14 mg/dL (ref 6–20)
CO2: 23 mmol/L (ref 22–32)
Calcium: 10.8 mg/dL — ABNORMAL HIGH (ref 8.9–10.3)
Chloride: 107 mmol/L (ref 101–111)
Creatinine, Ser: 0.84 mg/dL (ref 0.44–1.00)
GFR calc Af Amer: >60 mL/min (ref >60)
GFR calc non Af Amer: >60 mL/min (ref >60)
GLUCOSE: 112 mg/dL — AB (ref 65–99)
POTASSIUM: 4.1 mmol/L (ref 3.5–5.1)
SODIUM: 140 mmol/L (ref 135–145)
TOTAL PROTEIN: 7.2 g/dL (ref 6.5–8.1)
Total Bilirubin: 0.7 mg/dL (ref 0.3–1.2)

## 2014-08-10 LAB — MRSA PCR SCREENING: MRSA BY PCR: NEGATIVE

## 2014-08-10 LAB — CBC WITH DIFFERENTIAL/PLATELET
BASOS PCT: 0 % (ref 0–1)
Basophils Absolute: 0 10*3/uL (ref 0.0–0.1)
Eosinophils Absolute: 0 10*3/uL (ref 0.0–0.7)
Eosinophils Relative: 0 % (ref 0–5)
HCT: 44.5 % (ref 36.0–46.0)
Hemoglobin: 14.9 g/dL (ref 12.0–15.0)
LYMPHS PCT: 7 % — AB (ref 12–46)
Lymphs Abs: 0.8 10*3/uL (ref 0.7–4.0)
MCH: 31 pg (ref 26.0–34.0)
MCHC: 33.5 g/dL (ref 30.0–36.0)
MCV: 92.5 fL (ref 78.0–100.0)
Monocytes Absolute: 0.4 10*3/uL (ref 0.1–1.0)
Monocytes Relative: 4 % (ref 3–12)
NEUTROS ABS: 10.1 10*3/uL — AB (ref 1.7–7.7)
Neutrophils Relative %: 89 % — ABNORMAL HIGH (ref 43–77)
PLATELETS: 173 10*3/uL (ref 150–400)
RBC: 4.81 MIL/uL (ref 3.87–5.11)
RDW: 13.6 % (ref 11.5–15.5)
WBC: 11.4 10*3/uL — ABNORMAL HIGH (ref 4.0–10.5)

## 2014-08-10 LAB — PROTIME-INR
INR: 0.95 (ref 0.00–1.49)
Prothrombin Time: 12.9 seconds (ref 11.6–15.2)

## 2014-08-10 MED ORDER — LEVOTHYROXINE SODIUM 75 MCG PO TABS
75.0000 ug | ORAL_TABLET | Freq: Every day | ORAL | Status: DC
Start: 2014-08-11 — End: 2014-08-12
  Administered 2014-08-11 – 2014-08-12 (×2): 75 ug via ORAL
  Filled 2014-08-10 (×3): qty 1

## 2014-08-10 MED ORDER — TETANUS-DIPHTH-ACELL PERTUSSIS 5-2.5-18.5 LF-MCG/0.5 IM SUSP
0.5000 mL | Freq: Once | INTRAMUSCULAR | Status: AC
  Administered 2014-08-10: 0.5 mL via INTRAMUSCULAR
  Filled 2014-08-10: qty 0.5

## 2014-08-10 MED ORDER — ACETAMINOPHEN 650 MG RE SUPP: 650.0000 mg | RECTAL | Status: DC | PRN

## 2014-08-10 MED ORDER — KETOROLAC TROMETHAMINE 0.5 % OP SOLN
1.0000 [drp] | Freq: Four times a day (QID) | OPHTHALMIC | Status: DC
  Administered 2014-08-10 – 2014-08-14 (×14): 1 [drp] via OPHTHALMIC
  Filled 2014-08-10: qty 3

## 2014-08-10 MED ORDER — ACETAMINOPHEN 325 MG PO TABS: 650.0000 mg | ORAL_TABLET | ORAL | Status: DC | PRN

## 2014-08-10 MED ORDER — DILTIAZEM HCL 25 MG/5ML IV SOLN
15.0000 mg | Freq: Once | INTRAVENOUS | Status: AC
  Administered 2014-08-10: 15 mg via INTRAVENOUS

## 2014-08-10 MED ORDER — TRAMADOL HCL 50 MG PO TABS
50.0000 mg | ORAL_TABLET | Freq: Four times a day (QID) | ORAL | Status: DC | PRN
  Filled 2014-08-10: qty 1

## 2014-08-10 MED ORDER — KETOROLAC TROMETHAMINE 0.4 % OP SOLN: 1.0000 [drp] | Freq: Four times a day (QID) | OPHTHALMIC | Status: DC

## 2014-08-10 MED ORDER — OFLOXACIN 0.3 % OP SOLN
1.0000 [drp] | Freq: Four times a day (QID) | OPHTHALMIC | Status: DC
  Administered 2014-08-10 – 2014-08-14 (×14): 1 [drp] via OPHTHALMIC
  Filled 2014-08-10: qty 5

## 2014-08-10 MED ORDER — PANTOPRAZOLE SODIUM 40 MG IV SOLR
40.0000 mg | Freq: Every day | INTRAVENOUS | Status: DC
  Administered 2014-08-10 – 2014-08-13 (×4): 40 mg via INTRAVENOUS
  Filled 2014-08-10 (×6): qty 40

## 2014-08-10 MED ORDER — DILTIAZEM HCL 100 MG IV SOLR
5.0000 mg/h | Freq: Once | INTRAVENOUS | Status: AC
  Administered 2014-08-10: 5 mg/h via INTRAVENOUS

## 2014-08-10 MED ORDER — DILTIAZEM HCL 100 MG IV SOLR
5.0000 mg/h | INTRAVENOUS | Status: DC
  Administered 2014-08-12: 5 mg/h via INTRAVENOUS
  Filled 2014-08-10: qty 100

## 2014-08-10 MED ORDER — PREDNISOLONE ACETATE 1 % OP SUSP
1.0000 [drp] | Freq: Four times a day (QID) | OPHTHALMIC | Status: DC
  Administered 2014-08-10 – 2014-08-14 (×14): 1 [drp] via OPHTHALMIC
  Filled 2014-08-10: qty 1

## 2014-08-10 MED ORDER — SENNOSIDES-DOCUSATE SODIUM 8.6-50 MG PO TABS
1.0000 | ORAL_TABLET | Freq: Two times a day (BID) | ORAL | Status: DC
  Administered 2014-08-11 – 2014-08-14 (×6): 1 via ORAL
  Filled 2014-08-10 (×8): qty 1

## 2014-08-10 MED ORDER — SODIUM CHLORIDE 0.9 % IV BOLUS (SEPSIS)
500.0000 mL | Freq: Once | INTRAVENOUS | Status: AC
  Administered 2014-08-10: 500 mL via INTRAVENOUS

## 2014-08-10 NOTE — ED Provider Notes (Signed)
CSN: [id number]     Arrival date & time 08/10/14  1519 History   First MD Initiated Contact with Patient 08/10/14 1530     Chief Complaint  Patient presents with  . Fall     (Consider location/radiation/quality/duration/timing/severity/associated sxs/prior Treatment) Patient is a 81 y.o. female presenting with fall.  Fall This is a new problem. The current episode started 1 to 2 hours ago. Episode frequency: once. The problem has been resolved. Pertinent negatives include no chest pain, no abdominal pain, no headaches and no shortness of breath. Nothing aggravates the symptoms. Nothing relieves the symptoms. She has tried nothing for the symptoms. The treatment provided no relief.    Past Medical History  Diagnosis Date  . Hypothyroidism   . Arthritis   . Hypothyroidism     left thyroid  . OA (osteoarthritis)     hands  . Stenosed aortic valve     right carotid artery  . Lichen sclerosus   . Vitamin D deficiency   . Osteoporosis    Past Surgical History  Procedure Laterality Date  . Hand surgery  20years ago    open dislocation  . Abdominal hysterectomy  1974  . Appendectomy  1951  . Tonsillectomy  1942    x2  . Total knee arthroplasty  01/25/2012    Procedure: TOTAL KNEE ARTHROPLASTY;  Surgeon: Gearlean Alf, MD;  Location: [facility];  Service: Orthopedics;  Laterality: Right;   Family History  Problem Relation Age of Onset  . Heart attack Father   . Heart disease Father    History  Substance Use Topics  . Smoking status: Never Smoker   . Smokeless tobacco: Never Used  . Alcohol Use: No   OB History    No data available     Review of Systems  Constitutional: Negative for fever and fatigue.  HENT: Negative for congestion and drooling.   Eyes: Negative for pain.  Respiratory: Negative for cough and shortness of breath.   Cardiovascular: Negative for chest pain.  Gastrointestinal: Negative for nausea, vomiting, abdominal pain and diarrhea.  Genitourinary:  Negative for dysuria and hematuria.  Musculoskeletal: Negative for back pain, gait problem and neck pain.  Skin: Negative for color change.  Neurological: Negative for dizziness and headaches.  Hematological: Negative for adenopathy.  Psychiatric/Behavioral: Negative for behavioral problems.  All other systems reviewed and are negative.     Allergies  Augmentin; Quinine derivatives; and Penicillins  Home Medications   Prior to Admission medications   Medication Sig Start Date End Date Taking? Authorizing Provider  alendronate (FOSAMAX) 70 MG tablet Take 70 mg by mouth once a week.  12/14/13  Yes Historical Provider, MD  aspirin 81 MG tablet Take 81 mg by mouth daily.   Yes Historical Provider, MD  calcium-vitamin D 250-100 MG-UNIT per tablet Take 1 tablet by mouth daily.    Yes Historical Provider, MD  ketorolac (ACULAR) 0.4 % SOLN Place 1 drop into the right eye 4 (four) times daily. 07/20/14  Yes Historical Provider, MD  levothyroxine (SYNTHROID, LEVOTHROID) 75 MCG tablet Take 75 mcg by mouth daily before breakfast.   Yes Historical Provider, MD  Multiple Vitamins-Minerals (MEGA MULTI WOMEN PO) Take 1 tablet by mouth daily.    Yes Historical Provider, MD  ofloxacin (OCUFLOX) 0.3 % ophthalmic solution Place 1 drop into the right eye 4 (four) times daily. 07/20/14  Yes Historical Provider, MD  prednisoLONE acetate (PRED FORTE) 1 % ophthalmic suspension Place 1 drop into the right  eye 4 (four) times daily. 07/20/14  Yes Historical Provider, MD   BP 133/81 mmHg  Pulse 94  Temp(Src) 98.5 F (36.9 C) (Oral)  Resp 19  Wt 132 lb (59.875 kg)  SpO2 99% Physical Exam  Constitutional: She is oriented to person, place, and time. She appears well-developed and well-nourished.  HENT:  Head: Normocephalic.  Mouth/Throat: Oropharynx is clear and moist. No oropharyngeal exudate.  2 cm vertical laceration on the vertex of the head. Wound is hemostatic.  Eyes: Conjunctivae and EOM are normal. Pupils  are equal, round, and reactive to light.  Neck: Normal range of motion. Neck supple.  Cardiovascular: Normal rate, regular rhythm, normal heart sounds and intact distal pulses.  Exam reveals no gallop and no friction rub.   No murmur heard. Pulmonary/Chest: Effort normal and breath sounds normal. No respiratory distress. She has no wheezes.  Abdominal: Soft. Bowel sounds are normal. There is no tenderness. There is no rebound and no guarding.  Musculoskeletal: Normal range of motion. She exhibits no edema or tenderness.  Moves all extremities without pain.  Normal range of motion of the hips bilaterally.  No focal vertebral tenderness.  Neurological: She is alert and oriented to person, place, and time.  alert, oriented x3 speech: normal in context and clarity memory: intact grossly cranial nerves II-XII: intact motor strength: full proximally and distally no involuntary movements or tremors sensation: intact to light touch diffusely  cerebellar: finger-to-nose intact   Skin: Skin is warm and dry.  Psychiatric: She has a normal mood and affect. Her behavior is normal.  Nursing note and vitals reviewed.   ED Course  LACERATION REPAIR Date/Time: 08/10/2014 6:26 PM Performed by: Pamella Pert Authorized by: Pamella Pert Consent: Verbal consent obtained. Written consent not obtained. Risks and benefits: risks, benefits and alternatives were discussed Consent given by: patient Patient understanding: patient states understanding of the procedure being performed Required items: required blood products, implants, devices, and special equipment available Patient identity confirmed: verbally with patient, arm band, provided demographic data and hospital-assigned identification number Location: vertex of head. Laceration length: 2 cm Foreign bodies: no foreign bodies Tendon involvement: none Nerve involvement: none Vascular damage: no Patient sedated: no Irrigation solution:  saline Amount of cleaning: standard Debridement: none Skin closure: glue Patient tolerance: Patient tolerated the procedure well with no immediate complications   (including critical care time) Labs Review Labs Reviewed  CBC WITH DIFFERENTIAL/PLATELET - Abnormal; Notable for the following:    WBC 11.4 (*)    Neutrophils Relative % 89 (*)    Neutro Abs 10.1 (*)    Lymphocytes Relative 7 (*)    All other components within normal limits  COMPREHENSIVE METABOLIC PANEL - Abnormal; Notable for the following:    Glucose, Bld 112 (*)    Calcium 10.8 (*)    All other components within normal limits  PROTIME-INR  URINALYSIS, ROUTINE W REFLEX MICROSCOPIC (NOT AT [facility])    Imaging Review Dg Chest 2 View  08/10/2014   CLINICAL DATA:  Falling garden.  Left elbow and wrist pain.  EXAM: CHEST  2 VIEW  COMPARISON:  11/15/2003  FINDINGS: The heart size and mediastinal contours are within normal limits. Both lungs are clear. The visualized skeletal structures are unremarkable.  IMPRESSION: No active cardiopulmonary disease.   Electronically Signed   By: Rolm Baptise M.D.   On: 08/10/2014 16:05   Dg Elbow Complete Left  08/10/2014   CLINICAL DATA:  Fall and guard and today. Left elbow pain,  wrist pain.  EXAM: LEFT ELBOW - COMPLETE 3+ VIEW  COMPARISON:  None.  FINDINGS: No acute bony abnormality. Specifically, no fracture, subluxation, or dislocation. Soft tissues are intact. No joint effusion.  IMPRESSION: No acute bony abnormality.   Electronically Signed   By: Rolm Baptise M.D.   On: 08/10/2014 16:04   Dg Wrist Complete Left  08/10/2014   CLINICAL DATA:  Fall today with wrist pain, initial encounter  EXAM: LEFT WRIST - COMPLETE 3+ VIEW  COMPARISON:  None.  FINDINGS: Degenerative changes are noted at the first [facility] joint. Mild deformity of the distal radius is noted related to prior healed fracture. No acute fracture is noted. No gross soft tissue abnormality is seen.  IMPRESSION: Degenerative changes  without acute abnormality.   Electronically Signed   By: Inez Catalina M.D.   On: 08/10/2014 16:08   Ct Head Wo Contrast  08/10/2014   CLINICAL DATA:  Fall.  EXAM: CT HEAD WITHOUT CONTRAST  CT CERVICAL SPINE WITHOUT CONTRAST  TECHNIQUE: Multidetector CT imaging of the head and cervical spine was performed following the standard protocol without intravenous contrast. Multiplanar CT image reconstructions of the cervical spine were also generated.  COMPARISON:  None.  FINDINGS: CT HEAD FINDINGS  Acute subarachnoid hemorrhage overlies the left frontal lobe, image 18/series 2. No additional areas of hemorrhage identified. There is prominence of the sulci and ventricles consistent with brain atrophy. The paranasal stress set there is partial opacification of the sphenoid sinus. The paranasal sinuses are clear. The mastoid air cells are clear. There is a depressed skull fracture along the posterior midline overlying the superior sagittal sinus, image 54/series 3. No additional fractures or subluxations identified.  CT CERVICAL SPINE FINDINGS  Normal alignment of the cervical spine. The vertebral body heights are well preserved. There is disc space narrowing and ventral endplate spurring at J1-9 and C6-7. The facet joints are all aligned. No fracture or subluxation identified.  IMPRESSION: 1. Acute subarachnoid hemorrhage overlies the left frontal lobe. 2. Posterior calvarium depressed skull fracture which are overlies the superior sagittal sinus.   Electronically Signed   By: Kerby Moors M.D.   On: 08/10/2014 16:48   Ct Cervical Spine Wo Contrast  08/10/2014   CLINICAL DATA:  Fall.  EXAM: CT HEAD WITHOUT CONTRAST  CT CERVICAL SPINE WITHOUT CONTRAST  TECHNIQUE: Multidetector CT imaging of the head and cervical spine was performed following the standard protocol without intravenous contrast. Multiplanar CT image reconstructions of the cervical spine were also generated.  COMPARISON:  None.  FINDINGS: CT HEAD FINDINGS   Acute subarachnoid hemorrhage overlies the left frontal lobe, image 18/series 2. No additional areas of hemorrhage identified. There is prominence of the sulci and ventricles consistent with brain atrophy. The paranasal stress set there is partial opacification of the sphenoid sinus. The paranasal sinuses are clear. The mastoid air cells are clear. There is a depressed skull fracture along the posterior midline overlying the superior sagittal sinus, image 54/series 3. No additional fractures or subluxations identified.  CT CERVICAL SPINE FINDINGS  Normal alignment of the cervical spine. The vertebral body heights are well preserved. There is disc space narrowing and ventral endplate spurring at E1-7 and C6-7. The facet joints are all aligned. No fracture or subluxation identified.  IMPRESSION: 1. Acute subarachnoid hemorrhage overlies the left frontal lobe. 2. Posterior calvarium depressed skull fracture which are overlies the superior sagittal sinus.   Electronically Signed   By: Kerby Moors M.D.   On: 08/10/2014  16:48     EKG Interpretation   Date/Time:  Friday August 10 2014 15:26:38 EDT Ventricular Rate:  99 PR Interval:    QRS Duration: 91 QT Interval:  361 QTC Calculation: 463 R Axis:   52 Text Interpretation:  Atrial fibrillation Probable anteroseptal infarct,  old Repol abnrm, diffuse ST changes Confirmed by Kennis Buell  MD, Bettyjo Lundblad  ([id number]) on 08/10/2014 3:36:20 PM     CRITICAL CARE Performed by: Pamella Pert, S Total critical care time: 30 min Critical care time was exclusive of separately billable procedures and treating other patients. Critical care was necessary to treat or prevent imminent or life-threatening deterioration. Critical care was time spent personally by me on the following activities: development of treatment plan with patient and/or surrogate as well as nursing, discussions with consultants, evaluation of patient's response to treatment, examination of patient,  obtaining history from patient or surrogate, ordering and performing treatments and interventions, ordering and review of laboratory studies, ordering and review of radiographic studies, pulse oximetry and re-evaluation of patient's condition.  MDM   Final diagnoses:  Fall  Atrial fibrillation with RVR  SAH (subarachnoid hemorrhage)  Fracture of skull, open, initial encounter    5:05 PM 81 y.o. female w hx of hypothyroidism, aortic valve stenosis who presents with a fall which occurred prior to arrival. The patient states that she fell outside but does not know where or how she fell. She does not remember falling. She is here with a relative. He states that she has some mild dementia but otherwise lives by herself and takes care of herself. She complains of a 7 out of 10 headache. She declined any pain medicine. She appears to be in atrial fibrillation with a heart rate in the 120s to 130s on my exam. CT scan showing acute subarachnoid hemorrhage and depressed skull fracture. We'll consult neurosurgery. Patient otherwise neurologically intact. Will update tdap.   5:47 PM Repeat ecg showing afib w/ rvr. Pt denies cp.  6:25 PM Discussed w/ Dr. Vertell Limber. No NSU intervention needed. He is ok w/ dermabond repair, pt does not need abx. He recommended q 2 hr neuro checks. Anti-coag for afib will also need to be held.   Cardizem for rate control. Hospitalist to admit.   Pamella Pert, MD 08/11/14 1108

## 2014-08-10 NOTE — ED Notes (Signed)
Pt remains in radiology 

## 2014-08-10 NOTE — ED Notes (Signed)
MD at bedside. 

## 2014-08-10 NOTE — ED Notes (Signed)
Patel, MD at bedside.  

## 2014-08-10 NOTE — ED Notes (Signed)
Pt returned to room from radiology

## 2014-08-10 NOTE — ED Notes (Signed)
Pt in via EMS after a fall at home, fall was unwitnessed, pt does not remember falling, laceration to back of head, dressing applied from EMS with bleeding controlled, pt does not remember falling, unknown LOC, pt alert and oriented, history of mild dementia, c/o pain to back of head, also left elbow and wrist- redness noted but no deformity, no distress noted

## 2014-08-10 NOTE — H&P (Signed)
[facility] History and Physical  Patient: Kristine Holden  MRN: [id number]  DOB: [birth year]  DOS: the patient was seen and examined on 08/10/2014 PCP: Vidal Schwalbe, MD  Referring physician: Dr. Aline Brochure Chief Complaint: Fall and passing out episode  HPI: Kristine Holden is a 81 y.o. female with Past medical history of hypothyroidism, aortic stenosis. The patient is presenting with a fall. She mentions that she was at a family member's house and when she was walking she suddenly fell down. The fall was not witnessed and the patient does not remember the fall. Patient denies having any dizziness or lightheadedness at the time of my evaluation. She denies any recurrent fall. She denies any chest pain abdominal pain headache and neck pain focal deficit. She denies any nausea or vomiting or diarrhea as well. Denies any burning urination. She denies any change in her medications. Patient was found to have subarachnoid hemorrhage of the frontal lobe and neurosurgery was consulted who recommended conservative management for the patient and medicine admission.  The patient is coming from home. And at her baseline independent for most of her ADL.  Review of Systems: as mentioned in the history of present illness.  A comprehensive review of the other systems is negative.  Past Medical History  Diagnosis Date  . Hypothyroidism   . Arthritis   . Hypothyroidism     left thyroid  . OA (osteoarthritis)     hands  . Stenosed aortic valve     right carotid artery  . Lichen sclerosus   . Vitamin D deficiency   . Osteoporosis    Past Surgical History  Procedure Laterality Date  . Hand surgery  20years ago    open dislocation  . Abdominal hysterectomy  1974  . Appendectomy  1951  . Tonsillectomy  1942    x2  . Total knee arthroplasty  01/25/2012    Procedure: TOTAL KNEE ARTHROPLASTY;  Surgeon: Gearlean Alf, MD;  Location: [facility];  Service: Orthopedics;  Laterality: Right;    Social History:  reports that she has never smoked. She has never used smokeless tobacco. She reports that she does not drink alcohol or use illicit drugs.  Allergies  Allergen Reactions  . Augmentin [Amoxicillin-Pot Clavulanate] Nausea And Vomiting  . Quinine Derivatives   . Penicillins Rash    Family History  Problem Relation Age of Onset  . Heart attack Father   . Heart disease Father     Prior to Admission medications   Medication Sig Start Date End Date Taking? Authorizing Provider  alendronate (FOSAMAX) 70 MG tablet Take 70 mg by mouth once a week.  12/14/13  Yes Historical Provider, MD  aspirin 81 MG tablet Take 81 mg by mouth daily.   Yes Historical Provider, MD  calcium-vitamin D 250-100 MG-UNIT per tablet Take 1 tablet by mouth daily.    Yes Historical Provider, MD  ketorolac (ACULAR) 0.4 % SOLN Place 1 drop into the right eye 4 (four) times daily. 07/20/14  Yes Historical Provider, MD  levothyroxine (SYNTHROID, LEVOTHROID) 75 MCG tablet Take 75 mcg by mouth daily before breakfast.   Yes Historical Provider, MD  Multiple Vitamins-Minerals (MEGA MULTI WOMEN PO) Take 1 tablet by mouth daily.    Yes Historical Provider, MD  ofloxacin (OCUFLOX) 0.3 % ophthalmic solution Place 1 drop into the right eye 4 (four) times daily. 07/20/14  Yes Historical Provider, MD  prednisoLONE acetate (PRED FORTE) 1 % ophthalmic suspension Place 1 drop into the right  eye 4 (four) times daily. 07/20/14  Yes Historical Provider, MD    Physical Exam: Filed Vitals:   08/10/14 1900 08/10/14 1930 08/10/14 1958 08/10/14 2000  BP: 118/70 112/68 112/68 109/56  Pulse: 117 174 84 68  Temp:      TempSrc:      Resp: 20 11 26 14   Weight:      SpO2: 97% 97% 98% 97%    General: Alert, Awake and Oriented to Time, Place and Person. Appear in mild distress Laceration on the occipital area sealed with Dermabond  Eyes: PERRL ENT: Oral Mucosa clear moist. Neck: no JVD Cardiovascular: S1 and S2 Present, aortic  systolic  Murmur, Peripheral Pulses Present Respiratory: Bilateral Air entry equal and Decreased,  Clear to Auscultation, no Crackles, no wheezes Abdomen: Bowel Sound present, Soft and non tender Skin: no Rash Extremities: no Pedal edema, no calf tenderness Neurologic: Grossly no focal neuro deficit.  Labs on Admission:  CBC:  Recent Labs Lab 08/10/14 1539  WBC 11.4*  NEUTROABS 10.1*  HGB 14.9  HCT 44.5  MCV 92.5  PLT 173    CMP     Component Value Date/Time   NA 140 08/10/2014 1539   K 4.1 08/10/2014 1539   CL 107 08/10/2014 1539   CO2 23 08/10/2014 1539   GLUCOSE 112* 08/10/2014 1539   BUN 14 08/10/2014 1539   CREATININE 0.84 08/10/2014 1539   CALCIUM 10.8* 08/10/2014 1539   PROT 7.2 08/10/2014 1539   ALBUMIN 4.1 08/10/2014 1539   AST 30 08/10/2014 1539   ALT 22 08/10/2014 1539   ALKPHOS 69 08/10/2014 1539   BILITOT 0.7 08/10/2014 1539   GFRNONAA >60 08/10/2014 1539   GFRAA >60 08/10/2014 1539    No results for input(s): LIPASE, AMYLASE in the last 168 hours.  No results for input(s): CKTOTAL, CKMB, CKMBINDEX, TROPONINI in the last 168 hours. BNP (last 3 results) No results for input(s): BNP in the last 8760 hours.  ProBNP (last 3 results) No results for input(s): PROBNP in the last 8760 hours.   Radiological Exams on Admission: Dg Chest 2 View  08/10/2014   CLINICAL DATA:  Falling garden.  Left elbow and wrist pain.  EXAM: CHEST  2 VIEW  COMPARISON:  11/15/2003  FINDINGS: The heart size and mediastinal contours are within normal limits. Both lungs are clear. The visualized skeletal structures are unremarkable.  IMPRESSION: No active cardiopulmonary disease.   Electronically Signed   By: Rolm Baptise M.D.   On: 08/10/2014 16:05   Dg Elbow Complete Left  08/10/2014   CLINICAL DATA:  Fall and guard and today. Left elbow pain, wrist pain.  EXAM: LEFT ELBOW - COMPLETE 3+ VIEW  COMPARISON:  None.  FINDINGS: No acute bony abnormality. Specifically, no fracture,  subluxation, or dislocation. Soft tissues are intact. No joint effusion.  IMPRESSION: No acute bony abnormality.   Electronically Signed   By: Rolm Baptise M.D.   On: 08/10/2014 16:04   Dg Wrist Complete Left  08/10/2014   CLINICAL DATA:  Fall today with wrist pain, initial encounter  EXAM: LEFT WRIST - COMPLETE 3+ VIEW  COMPARISON:  None.  FINDINGS: Degenerative changes are noted at the first [facility] joint. Mild deformity of the distal radius is noted related to prior healed fracture. No acute fracture is noted. No gross soft tissue abnormality is seen.  IMPRESSION: Degenerative changes without acute abnormality.   Electronically Signed   By: Inez Catalina M.D.   On: 08/10/2014 16:08  Ct Head Wo Contrast  08/10/2014   CLINICAL DATA:  Fall.  EXAM: CT HEAD WITHOUT CONTRAST  CT CERVICAL SPINE WITHOUT CONTRAST  TECHNIQUE: Multidetector CT imaging of the head and cervical spine was performed following the standard protocol without intravenous contrast. Multiplanar CT image reconstructions of the cervical spine were also generated.  COMPARISON:  None.  FINDINGS: CT HEAD FINDINGS  Acute subarachnoid hemorrhage overlies the left frontal lobe, image 18/series 2. No additional areas of hemorrhage identified. There is prominence of the sulci and ventricles consistent with brain atrophy. The paranasal stress set there is partial opacification of the sphenoid sinus. The paranasal sinuses are clear. The mastoid air cells are clear. There is a depressed skull fracture along the posterior midline overlying the superior sagittal sinus, image 54/series 3. No additional fractures or subluxations identified.  CT CERVICAL SPINE FINDINGS  Normal alignment of the cervical spine. The vertebral body heights are well preserved. There is disc space narrowing and ventral endplate spurring at P3-2 and C6-7. The facet joints are all aligned. No fracture or subluxation identified.  IMPRESSION: 1. Acute subarachnoid hemorrhage overlies the left  frontal lobe. 2. Posterior calvarium depressed skull fracture which are overlies the superior sagittal sinus.   Electronically Signed   By: Kerby Moors M.D.   On: 08/10/2014 16:48   Ct Cervical Spine Wo Contrast  08/10/2014   CLINICAL DATA:  Fall.  EXAM: CT HEAD WITHOUT CONTRAST  CT CERVICAL SPINE WITHOUT CONTRAST  TECHNIQUE: Multidetector CT imaging of the head and cervical spine was performed following the standard protocol without intravenous contrast. Multiplanar CT image reconstructions of the cervical spine were also generated.  COMPARISON:  None.  FINDINGS: CT HEAD FINDINGS  Acute subarachnoid hemorrhage overlies the left frontal lobe, image 18/series 2. No additional areas of hemorrhage identified. There is prominence of the sulci and ventricles consistent with brain atrophy. The paranasal stress set there is partial opacification of the sphenoid sinus. The paranasal sinuses are clear. The mastoid air cells are clear. There is a depressed skull fracture along the posterior midline overlying the superior sagittal sinus, image 54/series 3. No additional fractures or subluxations identified.  CT CERVICAL SPINE FINDINGS  Normal alignment of the cervical spine. The vertebral body heights are well preserved. There is disc space narrowing and ventral endplate spurring at R5-1 and C6-7. The facet joints are all aligned. No fracture or subluxation identified.  IMPRESSION: 1. Acute subarachnoid hemorrhage overlies the left frontal lobe. 2. Posterior calvarium depressed skull fracture which are overlies the superior sagittal sinus.   Electronically Signed   By: Kerby Moors M.D.   On: 08/10/2014 16:48   EKG: Independently reviewed. normal EKG, normal sinus rhythm, unchanged from previous tracings, atrial fibrillation, rate RVR.  Assessment/Plan Principal Problem:   SAH (subarachnoid hemorrhage) Active Problems:   Atrial fibrillation with RVR   Syncope   S/P cataract surgery, right eye   1. SAH  (subarachnoid hemorrhage) The patient is presenting with a fall which was not witnessed, she has a subarachnoid hemorrhage of the frontal lobe as well as posterior calvarium fracture overlying superior sagittal sinus. Neurosurgery Dr. Vertell Limber does not think that the patient requires any acute surgery. At present she'll be admitted in stepdown unit. Neuro checks every 2 hours. Nothing by mouth pending stroke evaluation. Repeat CT scan as per neurosurgery. Holding antiplatelets medications at present.  2. A. fib with RVR. CHA2DS2-VASC score 4  Patient presents with A. fib with RVR which might be the etiology of  her fall. Currently her heart rate is improved in 90s. Continue with Cardizem drip overnight. Pauses and oral Cardizem later. Holding off on anticoagulation secondary to [facility].  3. syncope. Multifactorial etiology. Check orthostatic in the morning. Echogram in the morning. Possibly A. fib is also potential etiology.  4.cataract surgery recent. Continue with eyedrops as per home regimen.  Advance goals of care discussion: Full code as per my discussion with patient. Patient's sister will be her power of attorney.    Consults: ED physician discussed with Dr. Vertell Limber  from neurosurgery.  DVT Prophylaxis: mechanical compression device . Nutrition: Nothing by mouth pending swallowing and nothing by mouth effective midnight, until seen by neurosurgery.  Disposition: Admitted as inpatient, step-down unit.  Author: Berle Mull, MD [facility] Pager: [phone] 08/10/2014  If 7PM-7AM, please contact night-coverage [email or web address] Password [email or web address]

## 2014-08-10 NOTE — ED Notes (Signed)
Attempted report 

## 2014-08-11 ENCOUNTER — Inpatient Hospital Stay (HOSPITAL_COMMUNITY): Payer: Medicare Other

## 2014-08-11 DIAGNOSIS — I495 Sick sinus syndrome: Secondary | ICD-10-CM

## 2014-08-11 DIAGNOSIS — I350 Nonrheumatic aortic (valve) stenosis: Secondary | ICD-10-CM | POA: Insufficient documentation

## 2014-08-11 DIAGNOSIS — I4891 Unspecified atrial fibrillation: Secondary | ICD-10-CM

## 2014-08-11 DIAGNOSIS — L900 Lichen sclerosus et atrophicus: Secondary | ICD-10-CM

## 2014-08-11 DIAGNOSIS — E039 Hypothyroidism, unspecified: Secondary | ICD-10-CM | POA: Diagnosis present

## 2014-08-11 DIAGNOSIS — Z8781 Personal history of (healed) traumatic fracture: Secondary | ICD-10-CM | POA: Diagnosis present

## 2014-08-11 LAB — CBC WITH DIFFERENTIAL/PLATELET
Basophils Absolute: 0 10*3/uL (ref 0.0–0.1)
Basophils Relative: 0 % (ref 0–1)
EOS PCT: 0 % (ref 0–5)
Eosinophils Absolute: 0 10*3/uL (ref 0.0–0.7)
HEMATOCRIT: 39.9 % (ref 36.0–46.0)
HEMOGLOBIN: 13.5 g/dL (ref 12.0–15.0)
Lymphocytes Relative: 8 % — ABNORMAL LOW (ref 12–46)
Lymphs Abs: 1.1 10*3/uL (ref 0.7–4.0)
MCH: 31.3 pg (ref 26.0–34.0)
MCHC: 33.8 g/dL (ref 30.0–36.0)
MCV: 92.6 fL (ref 78.0–100.0)
Monocytes Absolute: 0.6 10*3/uL (ref 0.1–1.0)
Monocytes Relative: 4 % (ref 3–12)
Neutro Abs: 12.8 10*3/uL — ABNORMAL HIGH (ref 1.7–7.7)
Neutrophils Relative %: 88 % — ABNORMAL HIGH (ref 43–77)
Platelets: 170 10*3/uL (ref 150–400)
RBC: 4.31 MIL/uL (ref 3.87–5.11)
RDW: 13.9 % (ref 11.5–15.5)
WBC: 14.6 10*3/uL — ABNORMAL HIGH (ref 4.0–10.5)

## 2014-08-11 LAB — COMPREHENSIVE METABOLIC PANEL
ALT: 19 U/L (ref 14–54)
AST: 25 U/L (ref 15–41)
Albumin: 3.3 g/dL — ABNORMAL LOW (ref 3.5–5.0)
Alkaline Phosphatase: 57 U/L (ref 38–126)
Anion gap: 7 (ref 5–15)
BUN: 11 mg/dL (ref 6–20)
CHLORIDE: 110 mmol/L (ref 101–111)
CO2: 21 mmol/L — AB (ref 22–32)
Calcium: 9.8 mg/dL (ref 8.9–10.3)
Creatinine, Ser: 0.66 mg/dL (ref 0.44–1.00)
GFR calc Af Amer: >60 mL/min (ref >60)
GFR calc non Af Amer: >60 mL/min (ref >60)
Glucose, Bld: 143 mg/dL — ABNORMAL HIGH (ref 65–99)
Potassium: 3.7 mmol/L (ref 3.5–5.1)
SODIUM: 138 mmol/L (ref 135–145)
TOTAL PROTEIN: 6.3 g/dL — AB (ref 6.5–8.1)
Total Bilirubin: 0.9 mg/dL (ref 0.3–1.2)

## 2014-08-11 LAB — GLUCOSE, CAPILLARY
GLUCOSE-CAPILLARY: 131 mg/dL — AB (ref 65–99)
GLUCOSE-CAPILLARY: 150 mg/dL — AB (ref 65–99)
Glucose-Capillary: 117 mg/dL — ABNORMAL HIGH (ref 65–99)
Glucose-Capillary: 125 mg/dL — ABNORMAL HIGH (ref 65–99)

## 2014-08-11 LAB — TSH: TSH: 0.349 u[IU]/mL — AB (ref 0.350–4.500)

## 2014-08-11 LAB — PROTIME-INR
INR: 1.11 (ref 0.00–1.49)
Prothrombin Time: 14.5 seconds (ref 11.6–15.2)

## 2014-08-11 MED ORDER — LORAZEPAM 0.5 MG PO TABS
0.5000 mg | ORAL_TABLET | Freq: Once | ORAL | Status: AC
  Administered 2014-08-11: 0.5 mg via ORAL
  Filled 2014-08-11: qty 1

## 2014-08-11 NOTE — Progress Notes (Signed)
  Echocardiogram 2D Echocardiogram has been performed.  Bobbye Charleston 08/11/2014, 10:47 AM

## 2014-08-11 NOTE — Progress Notes (Signed)
Pt became confused, pulling at tube/lines, and trying to get out of bed. Stated that she wanted to go watch tv in the commons room. Pt was reoriented and redirected. Pt was assisted to the [facility] and voided. Fredirick Maudlin, NP, was notified and orders given.  See MAR.

## 2014-08-11 NOTE — Progress Notes (Signed)
Kristine Holden:[id number] DOB: [birth year] DOA: 08/10/2014 PCP: Vidal Schwalbe, MD  Brief narrative:  81 y/o ? 1deg AVB +sinus brady-s/p holter monitor, prior knee surgery, hypothyrodoi s/p FNAC sugestive of thyroiditis 12/2012, Osteoporosis. Patient in her usual state of health until around 1400 08/10/14 Patient had been working in the basement at her nephew's house moving around a couple of things and had an unwitnessed fall as well as laceration to the back of the head. She did not remember antecedent events immediately prior or after the fall. It is unclear whether there was any seizure activity and how long she might have been confused after the fall She had a lot of bleeding and EMS was deployed and patient was brought to the emergency room. Patient was noted to have an acute subarachnoid hemorrhage over the left frontal lobe with the posterior calvaria and depressed skull fracture overlying the superior sagittal sinus and was also found to be in atrial fibrillation with RVR and was admitted to step down unit and placed on Cardizem drip  Past medical history-As per Problem list Chart reviewed as below-   Consultants:  none  Procedures:  none  Antibiotics:  none   Subjective   Doing fair this morning Little bit sleepy No pain in the head at present No nausea no vomiting no chest pain No blurred or double vision   Objective    Interim History:   Telemetry: Atrial fibrillation rates 80s to 90s   Objective: Filed Vitals:   08/11/14 0000 08/11/14 0200 08/11/14 0400 08/11/14 0838  BP: 97/61 94/60 99/55  105/53  Pulse: 73 63 76 68  Temp: 98.7 F (37.1 C) 98.4 F (36.9 C) 99.3 F (37.4 C) 97.5 F (36.4 C)  TempSrc: Oral Oral Oral Oral  Resp: 17 21 19 14   Height:      Weight:      SpO2: 96% 97% 97% 96%    Intake/Output Summary (Last 24 hours) at 08/11/14 0855 Last data filed at 08/11/14 0700  Gross per 24 hour  Intake    184 ml  Output      0 ml  Net     184 ml    Exam:  General: EOMI NCAT Cardiovascular: S1-S2 no murmur rub or gallop Respiratory: Clinically clear no added sound Abdomen: Soft nontender nondistended no rebound or guarding Skin no lower extremity edema Neuro grossly intact  Data Reviewed: Basic Metabolic Panel:  Recent Labs Lab 08/10/14 1539 08/11/14 0232  NA 140 138  K 4.1 3.7  CL 107 110  CO2 23 21*  GLUCOSE 112* 143*  BUN 14 11  CREATININE 0.84 0.66  CALCIUM 10.8* 9.8   Liver Function Tests:  Recent Labs Lab 08/10/14 1539 08/11/14 0232  AST 30 25  ALT 22 19  ALKPHOS 69 57  BILITOT 0.7 0.9  PROT 7.2 6.3*  ALBUMIN 4.1 3.3*   No results for input(s): LIPASE, AMYLASE in the last 168 hours. No results for input(s): AMMONIA in the last 168 hours. CBC:  Recent Labs Lab 08/10/14 1539 08/11/14 0232  WBC 11.4* 14.6*  NEUTROABS 10.1* 12.8*  HGB 14.9 13.5  HCT 44.5 39.9  MCV 92.5 92.6  PLT 173 170   Cardiac Enzymes: No results for input(s): CKTOTAL, CKMB, CKMBINDEX, TROPONINI in the last 168 hours. BNP: Invalid input(s): POCBNP CBG: No results for input(s): GLUCAP in the last 168 hours.  Recent Results (from the past 240 hour(s))  MRSA PCR Screening     Status: None  Collection Time: 08/10/14  8:50 PM  Result Value Ref Range Status   MRSA by PCR NEGATIVE NEGATIVE Final    Comment:        The GeneXpert MRSA Assay (FDA approved for NASAL specimens only), is one component of a comprehensive MRSA colonization surveillance program. It is not intended to diagnose MRSA infection nor to guide or monitor treatment for MRSA infections.      Studies:              All Imaging reviewed and is as per above notation   Scheduled Meds: . ketorolac  1 drop Right Eye QID  . levothyroxine  75 mcg Oral QAC breakfast  . ofloxacin  1 drop Right Eye QID  . pantoprazole (PROTONIX) IV  40 mg Intravenous QHS  . prednisoLONE acetate  1 drop Right Eye QID  . senna-docusate  1 tablet Oral BID    Continuous Infusions: . diltiazem (CARDIZEM) infusion       Assessment/Plan:  Principal Problem:   Tachycardia with sick sinus syndrome, CHad2Vasc2=3, HASBLED=1-I've asked cardiology to have a look at the patient regarding  anticoagulation Rate control versus rhythm control strategies Potential risk benefit regarding electrical cardioversion Echocardiogram with contrast has been ordered stat and is pending I do not see where she has had a recent TSH level but she also has history of either thyroiditis or follicular adenoma and I will order this for completion sake. We appreciate their assistance in advance   Active Problems:   Atrial fibrillation with RVR-continue Cardizem at 5 mg per hour--if maintained sinus rhythm potentially transition to 120 mg CD later on today   SAH (subarachnoid hemorrhage)-no role for anticoagulation currently given acute subarachnoid   Syncope-patient has history of first-degree AV block and has had a Holter monitor in the past under Dr. Freddy Finner will keep the patient on telemetry   S/P cataract surgery, right eye   Hypothyroidism-see above discussion. Continue Synthroid 75 g daily   Stenosed aortic valve-per cardiology   Lichen sclerosus    Code Status: Full code Family Communication: None present Disposition Plan: Step down for now   Verneita Griffes, MD  [facility] Pager [phone] 08/11/2014, 8:55 AM    LOS: 1 day

## 2014-08-11 NOTE — Consult Note (Signed)
CARDIOLOGY CONSULT NOTE   Patient ID: Kristine Holden MRN: [id number] DOB/AGE: 1927-07-11 81 y.o.  Admit date: 08/10/2014  Primary Physician   Vidal Schwalbe, MD Primary Cardiologist   Dr. Marlou Porch Reason for Consultation  Afib with RVR  HPI: Kristine Holden is a 81 y.o. female with a history of 1st degree AV block, sinus bradycardia, carotid artery disease, hypothyroidism, mild dementia and OA presented to [facility] ED with Fall 08/10/14 and found to have Afib with RVR and SAH of frontal lobe and depressed skull fracture.  She was doing well on cardiac stand point of view when last seen by Dr. Marlou Porch in 12/2013. Not a candidate for pacemaker at that time and plan was to avoid AV nodal blocking agent.   The patient states that was removing grocery from car at her nephew's house in garage and suddenly she passed out. She dose not know for how long she was out. Next thing she was remember is bleeding from head and went into home. She denies any chest pain, palpitation, SOB, orthopnea, PND or LE edema.   In ED she was placed on cardizem drip and cardiology is consulted for further evaluation.    Past Medical History  Diagnosis Date  . Hypothyroidism   . Arthritis   . Hypothyroidism     left thyroid  . OA (osteoarthritis)     hands  . Stenosed aortic valve     right carotid artery  . Lichen sclerosus   . Vitamin D deficiency   . Osteoporosis      Past Surgical History  Procedure Laterality Date  . Hand surgery  20years ago    open dislocation  . Abdominal hysterectomy  1974  . Appendectomy  1951  . Tonsillectomy  1942    x2  . Total knee arthroplasty  01/25/2012    Procedure: TOTAL KNEE ARTHROPLASTY;  Surgeon: Gearlean Alf, MD;  Location: [facility] ORS;  Service: Orthopedics;  Laterality: Right;    Allergies  Allergen Reactions  . Augmentin [Amoxicillin-Pot Clavulanate] Nausea And Vomiting  . Quinine Derivatives   . Penicillins Rash    I have reviewed the patient's current  medications . ketorolac  1 drop Right Eye QID  . levothyroxine  75 mcg Oral QAC breakfast  . ofloxacin  1 drop Right Eye QID  . pantoprazole (PROTONIX) IV  40 mg Intravenous QHS  . prednisoLONE acetate  1 drop Right Eye QID  . senna-docusate  1 tablet Oral BID   . diltiazem (CARDIZEM) infusion     acetaminophen **OR** acetaminophen, traMADol  Prior to Admission medications   Medication Sig Start Date End Date Taking? Authorizing Provider  alendronate (FOSAMAX) 70 MG tablet Take 70 mg by mouth once a week.  12/14/13  Yes Historical Provider, MD  aspirin 81 MG tablet Take 81 mg by mouth daily.   Yes Historical Provider, MD  calcium-vitamin D 250-100 MG-UNIT per tablet Take 1 tablet by mouth daily.    Yes Historical Provider, MD  ketorolac (ACULAR) 0.4 % SOLN Place 1 drop into the right eye 4 (four) times daily. 07/20/14  Yes Historical Provider, MD  levothyroxine (SYNTHROID, LEVOTHROID) 75 MCG tablet Take 75 mcg by mouth daily before breakfast.   Yes Historical Provider, MD  Multiple Vitamins-Minerals (MEGA MULTI WOMEN PO) Take 1 tablet by mouth daily.    Yes Historical Provider, MD  ofloxacin (OCUFLOX) 0.3 % ophthalmic solution Place 1 drop into the right eye 4 (four) times daily. 07/20/14  Yes  Historical Provider, MD  prednisoLONE acetate (PRED FORTE) 1 % ophthalmic suspension Place 1 drop into the right eye 4 (four) times daily. 07/20/14  Yes Historical Provider, MD     History   Social History  . Marital Status: Single    Spouse Name: N/A  . Number of Children: N/A  . Years of Education: N/A   Occupational History  . Not on file.   Social History Main Topics  . Smoking status: Never Smoker   . Smokeless tobacco: Never Used  . Alcohol Use: No  . Drug Use: No  . Sexual Activity: Not on file   Other Topics Concern  . Not on file   Social History Narrative    Family Status  Relation Status Death Age  . Mother Deceased   . Father Deceased   . Maternal Grandmother Deceased     . Maternal Grandfather Deceased   . Paternal Grandmother Deceased   . Paternal Grandfather Deceased    Family History  Problem Relation Age of Onset  . Heart attack Father   . Heart disease Father      ROS:  Full 14 point review of systems complete and found to be negative unless listed above.  Physical Exam: Blood pressure 122/83, pulse 65, temperature 98.3 F (36.8 C), temperature source Oral, resp. rate 18, height 5\' 5"  (1.651 m), weight 127 lb 10.3 oz (57.9 kg), SpO2 99 %.  General: Well developed, well nourished, female in no acute distress Head: Eyes PERRLA, No xanthomas. Normocephalic and laceration on back of her head, oropharynx without edema or exudate.  Lungs: Resp regular and unlabored, CTA. Heart: irregularly irregular with regular rate,  no s3, s4, or murmurs..   Neck: left carotid bruits. No lymphadenopathy.  No JVD. Abdomen: Bowel sounds present, abdomen soft and non-tender without masses or hernias noted. Msk:  No spine or cva tenderness. No weakness, no joint deformities or effusions. Extremities: No clubbing, cyanosis or edema. DP/PT/Radials 2+ and equal bilaterally. Neuro: Alert and oriented X 3. No focal deficits noted. Psych:  Good affect, responds appropriately Skin: No rashes or lesions noted.  Labs:   Lab Results  Component Value Date   WBC 14.6* 08/11/2014   HGB 13.5 08/11/2014   HCT 39.9 08/11/2014   MCV 92.6 08/11/2014   PLT 170 08/11/2014    Recent Labs  08/11/14 0232  INR 1.11    Recent Labs Lab 08/11/14 0232  NA 138  K 3.7  CL 110  CO2 21*  BUN 11  CREATININE 0.66  CALCIUM 9.8  PROT 6.3*  BILITOT 0.9  ALKPHOS 57  ALT 19  AST 25  GLUCOSE 143*  ALBUMIN 3.3*     ECG:  Afib at rate of 131  Radiology:  Dg Chest 2 View  08/10/2014   CLINICAL DATA:  Falling garden.  Left elbow and wrist pain.  EXAM: CHEST  2 VIEW  COMPARISON:  11/15/2003  FINDINGS: The heart size and mediastinal contours are within normal limits. Both lungs  are clear. The visualized skeletal structures are unremarkable.  IMPRESSION: No active cardiopulmonary disease.   Electronically Signed   By: Rolm Baptise M.D.   On: 08/10/2014 16:05   Dg Elbow Complete Left  08/10/2014   CLINICAL DATA:  Fall and guard and today. Left elbow pain, wrist pain.  EXAM: LEFT ELBOW - COMPLETE 3+ VIEW  COMPARISON:  None.  FINDINGS: No acute bony abnormality. Specifically, no fracture, subluxation, or dislocation. Soft tissues are intact. No joint  effusion.  IMPRESSION: No acute bony abnormality.   Electronically Signed   By: Rolm Baptise M.D.   On: 08/10/2014 16:04   Dg Wrist Complete Left  08/10/2014   CLINICAL DATA:  Fall today with wrist pain, initial encounter  EXAM: LEFT WRIST - COMPLETE 3+ VIEW  COMPARISON:  None.  FINDINGS: Degenerative changes are noted at the first [facility] joint. Mild deformity of the distal radius is noted related to prior healed fracture. No acute fracture is noted. No gross soft tissue abnormality is seen.  IMPRESSION: Degenerative changes without acute abnormality.   Electronically Signed   By: Inez Catalina M.D.   On: 08/10/2014 16:08   Ct Head Wo Contrast  08/10/2014   CLINICAL DATA:  Fall.  EXAM: CT HEAD WITHOUT CONTRAST  CT CERVICAL SPINE WITHOUT CONTRAST  TECHNIQUE: Multidetector CT imaging of the head and cervical spine was performed following the standard protocol without intravenous contrast. Multiplanar CT image reconstructions of the cervical spine were also generated.  COMPARISON:  None.  FINDINGS: CT HEAD FINDINGS  Acute subarachnoid hemorrhage overlies the left frontal lobe, image 18/series 2. No additional areas of hemorrhage identified. There is prominence of the sulci and ventricles consistent with brain atrophy. The paranasal stress set there is partial opacification of the sphenoid sinus. The paranasal sinuses are clear. The mastoid air cells are clear. There is a depressed skull fracture along the posterior midline overlying the superior  sagittal sinus, image 54/series 3. No additional fractures or subluxations identified.  CT CERVICAL SPINE FINDINGS  Normal alignment of the cervical spine. The vertebral body heights are well preserved. There is disc space narrowing and ventral endplate spurring at Y6-3 and C6-7. The facet joints are all aligned. No fracture or subluxation identified.  IMPRESSION: 1. Acute subarachnoid hemorrhage overlies the left frontal lobe. 2. Posterior calvarium depressed skull fracture which are overlies the superior sagittal sinus.   Electronically Signed   By: Kerby Moors M.D.   On: 08/10/2014 16:48   Ct Cervical Spine Wo Contrast  08/10/2014   CLINICAL DATA:  Fall.  EXAM: CT HEAD WITHOUT CONTRAST  CT CERVICAL SPINE WITHOUT CONTRAST  TECHNIQUE: Multidetector CT imaging of the head and cervical spine was performed following the standard protocol without intravenous contrast. Multiplanar CT image reconstructions of the cervical spine were also generated.  COMPARISON:  None.  FINDINGS: CT HEAD FINDINGS  Acute subarachnoid hemorrhage overlies the left frontal lobe, image 18/series 2. No additional areas of hemorrhage identified. There is prominence of the sulci and ventricles consistent with brain atrophy. The paranasal stress set there is partial opacification of the sphenoid sinus. The paranasal sinuses are clear. The mastoid air cells are clear. There is a depressed skull fracture along the posterior midline overlying the superior sagittal sinus, image 54/series 3. No additional fractures or subluxations identified.  CT CERVICAL SPINE FINDINGS  Normal alignment of the cervical spine. The vertebral body heights are well preserved. There is disc space narrowing and ventral endplate spurring at Z8-5 and C6-7. The facet joints are all aligned. No fracture or subluxation identified.  IMPRESSION: 1. Acute subarachnoid hemorrhage overlies the left frontal lobe. 2. Posterior calvarium depressed skull fracture which are overlies  the superior sagittal sinus.   Electronically Signed   By: Kerby Moors M.D.   On: 08/10/2014 16:48    ASSESSMENT AND PLAN:    Principal Problem:   Tachycardia with sick sinus syndrome, CHad2Vasc2=3, HASBLED=1 Active Problems:   Atrial fibrillation with RVR   SAH (  subarachnoid hemorrhage)   Syncope   S/P cataract surgery, right eye   Hypothyroidism   Stenosed aortic valve   Lichen sclerosus  1. Afib with RVR - New onset Afib with RVR. CHADSVASc score is at least 4 (age 81, sex and carotid artery disease). Rate improved on IV cardizem. Continue at 5mg /hours. No anticoagulation until cleared by neruo due to [facility]. TSH pending. - echo pending  2. Syncope - Hx of 1st AV block and sinus bradycardia. - Continue on tele. 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 Was not a candidate in past. Will get EP eval.  - Continue to monitor on tele  3. [facility] - medical management per neuro surgery  4. Aortic valve stenosis - pending echo. Euvolemic.  5. Carotid artery disease - most recent carotid doppler 06/21/14 showed left greater than right mild carotid atherosclerosis. No hemodynamically significant ICA stenosis by ultrasound. Degree of narrowing less than 50% bilaterally.    SignedLeanor Kail, PA 08/11/2014, 1:44 PM Pager [phone]  Co-Sign MD  Patient seen with PA, agree with the above note.  Patient was walking in her yard yesterday and passed out apparently without warning.  No prodrome.  She fell backwards and hit her head, suffering traumatic SAH.  In the ER, she was found to be in atrial fibrillation with RVR in the 120s-130s.  BP preserved.   She was seen by neurosurgery, plan for medical management.  She has remained in atrial fibrillation with short pauses, longest 2.4 sec.  1. Atrial fibrillation: Continue diltiazem gtt for now.  No anticoagulation at this time with traumatic SAH.  She should likely be anticoagulated in the future.  2. Syncope: I am concerned for a bradyarrhythmia as  cause.  She has history of baseline sinus bradycardia with 1st degree AVB.  Possibly tachy-brady syndrome with atrial fibrillation.  Short pauses on telemetry, longest 2.4 sec.  - Watch on telemetry overnight.   - I will have her seen by EP tomorrow. If no long pauses are seen, may need event monitor or ILR. I suspect that ultimately she will need a pacemaker.   Loralie Champagne 08/11/2014 2:35 PM

## 2014-08-11 NOTE — Evaluation (Signed)
Physical Therapy Evaluation Patient Details Name: Kristine Holden MRN: [id number] DOB: 03-24-1927 Today's Date: 08/11/2014   History of Present Illness  Kristine Holden is a 81 y.o. female with a history of 1st degree AV block, sinus bradycardia, carotid artery disease, hypothyroidism, mild dementia and OA presented to [facility] ED with Fall 08/10/14 and found to have Afib with RVR and SAH of frontal lobe and depressed skull fracture.  Clinical Impression  Pt admitted with above diagnosis. Pt currently with functional limitations due to the deficits listed below (see PT Problem List). Pt has sister that can assist.  Plans to go to her house for awhile with HHPT and use a RW.   Pt will benefit from skilled PT to increase their independence and safety with mobility to allow discharge to the venue listed below.      Follow Up Recommendations Supervision/Assistance - 24 hour;Home health PT    Equipment Recommendations  Rolling walker with 5" wheels    Recommendations for Other Services       Precautions / Restrictions Precautions Precautions: Fall Restrictions Weight Bearing Restrictions: No      Mobility  Bed Mobility Overal bed mobility: Needs Assistance Bed Mobility: Supine to Sit     Supine to sit: Min guard        Transfers Overall transfer level: Needs assistance Equipment used: None Transfers: Sit to/from Stand Sit to Stand: Min guard         General transfer comment: steadying assist needed  Ambulation/Gait Ambulation/Gait assistance: Min guard Ambulation Distance (Feet): 150 Feet Assistive device: None Gait Pattern/deviations: Step-through pattern;Shuffle;Staggering left;Staggering right   Gait velocity interpretation: Below normal speed for age/gender General Gait Details: Pt occasionally staggers but no significant LOB.  Pt may need a RW for safety if ambulating by herself for safety.  Pt willing to use RW.    Stairs            Wheelchair Mobility     Modified Rankin (Stroke Patients Only)       Balance Overall balance assessment: Needs assistance         Standing balance support: No upper extremity supported;During functional activity Standing balance-Leahy Scale: Fair Standing balance comment: can stand statically without UE support for up to 1 min.  Cannot withstand challenges to balance.                               Pertinent Vitals/Pain Pain Assessment: No/denies pain  98% RA, 114/48    Home Living Family/patient expects to be discharged to:: Private residence Living Arrangements: Other relatives Available Help at Discharge: Family;Available 24 hours/day (plans to go stay with sister) Type of Home: House Home Access: Stairs to enter Entrance Stairs-Rails: None Entrance Stairs-Number of Steps: 2 Home Layout: One level Home Equipment: Cane - single point      Prior Function Level of Independence: Independent               Hand Dominance        Extremity/Trunk Assessment   Upper Extremity Assessment: Defer to OT evaluation           Lower Extremity Assessment: Generalized weakness      Cervical / Trunk Assessment: Normal  Communication   Communication: No difficulties  Cognition Arousal/Alertness: Awake/alert Behavior During Therapy: WFL for tasks assessed/performed Overall Cognitive Status: Within Functional Limits for tasks assessed  General Comments      Exercises        Assessment/Plan    PT Assessment Patient needs continued PT services  PT Diagnosis Generalized weakness   PT Problem List Decreased activity tolerance;Decreased balance;Decreased mobility;Decreased strength;Decreased knowledge of use of DME;Decreased safety awareness;Decreased knowledge of precautions  PT Treatment Interventions DME instruction;Gait training;Stair training;Functional mobility training;Therapeutic activities;Therapeutic exercise;Balance  training;Patient/family education   PT Goals (Current goals can be found in the Care Plan section) Acute Rehab PT Goals Patient Stated Goal: to go home PT Goal Formulation: With patient Time For Goal Achievement: 08/25/14 Potential to Achieve Goals: Good    Frequency Min 3X/week   Barriers to discharge        Co-evaluation               End of Session Equipment Utilized During Treatment: Gait belt Activity Tolerance: Patient limited by fatigue Patient left: in chair;with call bell/phone within reach;with nursing/sitter in room Nurse Communication: Mobility status         Time: 1208-1232 PT Time Calculation (min) (ACUTE ONLY): 24 min   Charges:   PT Evaluation $Initial PT Evaluation Tier I: 1 Procedure PT Treatments $Gait Training: 8-22 mins   PT G CodesDenice Holden Aug 27, 2014, 3:13 PM Kristine Holden,PT Acute Rehabilitation [phone] (pager)

## 2014-08-12 ENCOUNTER — Encounter (HOSPITAL_COMMUNITY): Payer: Self-pay | Admitting: Internal Medicine

## 2014-08-12 DIAGNOSIS — R55 Syncope and collapse: Secondary | ICD-10-CM

## 2014-08-12 DIAGNOSIS — I4891 Unspecified atrial fibrillation: Secondary | ICD-10-CM

## 2014-08-12 DIAGNOSIS — R001 Bradycardia, unspecified: Secondary | ICD-10-CM

## 2014-08-12 DIAGNOSIS — I609 Nontraumatic subarachnoid hemorrhage, unspecified: Secondary | ICD-10-CM

## 2014-08-12 DIAGNOSIS — I440 Atrioventricular block, first degree: Secondary | ICD-10-CM

## 2014-08-12 DIAGNOSIS — S0291XA Unspecified fracture of skull, initial encounter for closed fracture: Secondary | ICD-10-CM

## 2014-08-12 LAB — CBC WITH DIFFERENTIAL/PLATELET
BASOS ABS: 0 10*3/uL (ref 0.0–0.1)
BASOS PCT: 0 % (ref 0–1)
EOS PCT: 0 % (ref 0–5)
Eosinophils Absolute: 0 10*3/uL (ref 0.0–0.7)
HEMATOCRIT: 40.7 % (ref 36.0–46.0)
HEMOGLOBIN: 13.7 g/dL (ref 12.0–15.0)
Lymphocytes Relative: 10 % — ABNORMAL LOW (ref 12–46)
Lymphs Abs: 0.9 10*3/uL (ref 0.7–4.0)
MCH: 31.1 pg (ref 26.0–34.0)
MCHC: 33.7 g/dL (ref 30.0–36.0)
MCV: 92.3 fL (ref 78.0–100.0)
Monocytes Absolute: 0.6 10*3/uL (ref 0.1–1.0)
Monocytes Relative: 6 % (ref 3–12)
NEUTROS ABS: 7.8 10*3/uL — AB (ref 1.7–7.7)
NEUTROS PCT: 84 % — AB (ref 43–77)
PLATELETS: 151 10*3/uL (ref 150–400)
RBC: 4.41 MIL/uL (ref 3.87–5.11)
RDW: 13.8 % (ref 11.5–15.5)
WBC: 9.3 10*3/uL (ref 4.0–10.5)

## 2014-08-12 LAB — GLUCOSE, CAPILLARY
Glucose-Capillary: 117 mg/dL — ABNORMAL HIGH (ref 65–99)
Glucose-Capillary: 101 mg/dL — ABNORMAL HIGH (ref 65–99)

## 2014-08-12 MED ORDER — LEVOTHYROXINE SODIUM 50 MCG PO TABS
50.0000 ug | ORAL_TABLET | Freq: Every day | ORAL | Status: DC
  Administered 2014-08-14: 50 ug via ORAL
  Filled 2014-08-12 (×4): qty 1

## 2014-08-12 NOTE — Progress Notes (Signed)
Patient Name: Kristine Holden Date of Encounter: 08/12/2014     Principal Problem:   Syncope Active Problems:   Atrial fibrillation with RVR   SAH (subarachnoid hemorrhage)   Tachycardia with sick sinus syndrome, CHad2Vasc2=3, HASBLED=1   Fracture of skull   Sinus bradycardia   S/P cataract surgery, right eye   Hypothyroidism   Lichen sclerosus   1St degree AV block    SUBJECTIVE  Converted to sinus @ 3:21 this morning.  No chest pain, sob, palpitations, presyncope this AM.  Denies headache.  CURRENT MEDS . ketorolac  1 drop Right Eye QID  . levothyroxine  75 mcg Oral QAC breakfast  . ofloxacin  1 drop Right Eye QID  . pantoprazole (PROTONIX) IV  40 mg Intravenous QHS  . prednisoLONE acetate  1 drop Right Eye QID  . senna-docusate  1 tablet Oral BID    OBJECTIVE  Filed Vitals:   08/12/14 0530 08/12/14 0600 08/12/14 0630 08/12/14 0752  BP: 112/72 134/72 129/67 129/62  Pulse: 60 62 60 57  Temp:    98 F (36.7 C)  TempSrc:    Oral  Resp: 17 15 22 23   Height:      Weight:      SpO2: 98% 94% 96% 98%    Intake/Output Summary (Last 24 hours) at 08/12/14 0852 Last data filed at 08/12/14 0600  Gross per 24 hour  Intake    235 ml  Output    200 ml  Net     35 ml   Filed Weights   08/10/14 1530 08/10/14 2045  Weight: 132 lb (59.875 kg) 127 lb 10.3 oz (57.9 kg)    PHYSICAL EXAM  General: Pleasant, NAD. Neuro: Alert and oriented X 3. Moves all extremities spontaneously. Psych: Normal affect. HEENT:  Normal  Neck: Supple without bruits or JVD. Lungs:  Resp regular and unlabored, CTA. Heart: RRR no s3, s4, or murmurs. Abdomen: Soft, non-tender, non-distended, BS + x 4.  Extremities: No clubbing, cyanosis or edema. DP/PT/Radials 2+ and equal bilaterally.  Accessory Clinical Findings  CBC  Recent Labs  08/10/14 1539 08/11/14 0232  WBC 11.4* 14.6*  NEUTROABS 10.1* 12.8*  HGB 14.9 13.5  HCT 44.5 39.9  MCV 92.5 92.6  PLT 173 725   Basic Metabolic  Panel  Recent Labs  08/10/14 1539 08/11/14 0232  NA 140 138  K 4.1 3.7  CL 107 110  CO2 23 21*  GLUCOSE 112* 143*  BUN 14 11  CREATININE 0.84 0.66  CALCIUM 10.8* 9.8   Liver Function Tests  Recent Labs  08/10/14 1539 08/11/14 0232  AST 30 25  ALT 22 19  ALKPHOS 69 57  BILITOT 0.7 0.9  PROT 7.2 6.3*  ALBUMIN 4.1 3.3*   Thyroid Function Tests  Recent Labs  08/11/14 1130  TSH 0.349*   TELE  afib/flutter  converted to sinus @ 3:21 this morning - no significant post-termination pause.  ECG  Sinus brady, 58, vent couplet, 1st deg avb, no acute st/t changes.  Radiology/Studies  Dg Chest 2 View  08/10/2014   CLINICAL DATA:  Falling garden.  Left elbow and wrist pain.  EXAM: CHEST  2 VIEW  COMPARISON:  11/15/2003  FINDINGS: The heart size and mediastinal contours are within normal limits. Both lungs are clear. The visualized skeletal structures are unremarkable.  IMPRESSION: No active cardiopulmonary disease.   Electronically Signed   By: Rolm Baptise M.D.   On: 08/10/2014 16:05   Dg Elbow Complete  Left  08/10/2014   CLINICAL DATA:  Fall and guard and today. Left elbow pain, wrist pain.  EXAM: LEFT ELBOW - COMPLETE 3+ VIEW  COMPARISON:  None.  FINDINGS: No acute bony abnormality. Specifically, no fracture, subluxation, or dislocation. Soft tissues are intact. No joint effusion.  IMPRESSION: No acute bony abnormality.   Electronically Signed   By: Rolm Baptise M.D.   On: 08/10/2014 16:04   Dg Wrist Complete Left  08/10/2014   CLINICAL DATA:  Fall today with wrist pain, initial encounter  EXAM: LEFT WRIST - COMPLETE 3+ VIEW  COMPARISON:  None.  FINDINGS: Degenerative changes are noted at the first [facility] joint. Mild deformity of the distal radius is noted related to prior healed fracture. No acute fracture is noted. No gross soft tissue abnormality is seen.  IMPRESSION: Degenerative changes without acute abnormality.   Electronically Signed   By: Inez Catalina M.D.   On:  08/10/2014 16:08   Ct Head Wo Contrast  08/10/2014   CLINICAL DATA:  Fall.  EXAM: CT HEAD WITHOUT CONTRAST  CT CERVICAL SPINE WITHOUT CONTRAST  TECHNIQUE: Multidetector CT imaging of the head and cervical spine was performed following the standard protocol without intravenous contrast. Multiplanar CT image reconstructions of the cervical spine were also generated.  COMPARISON:  None.  FINDINGS: CT HEAD FINDINGS  Acute subarachnoid hemorrhage overlies the left frontal lobe, image 18/series 2. No additional areas of hemorrhage identified. There is prominence of the sulci and ventricles consistent with brain atrophy. The paranasal stress set there is partial opacification of the sphenoid sinus. The paranasal sinuses are clear. The mastoid air cells are clear. There is a depressed skull fracture along the posterior midline overlying the superior sagittal sinus, image 54/series 3. No additional fractures or subluxations identified.  CT CERVICAL SPINE FINDINGS  Normal alignment of the cervical spine. The vertebral body heights are well preserved. There is disc space narrowing and ventral endplate spurring at J5-7 and C6-7. The facet joints are all aligned. No fracture or subluxation identified.  IMPRESSION: 1. Acute subarachnoid hemorrhage overlies the left frontal lobe. 2. Posterior calvarium depressed skull fracture which are overlies the superior sagittal sinus.   Electronically Signed   By: Kerby Moors M.D.   On: 08/10/2014 16:48   Ct Cervical Spine Wo Contrast  08/10/2014   CLINICAL DATA:  Fall.  EXAM: CT HEAD WITHOUT CONTRAST  CT CERVICAL SPINE WITHOUT CONTRAST  TECHNIQUE: Multidetector CT imaging of the head and cervical spine was performed following the standard protocol without intravenous contrast. Multiplanar CT image reconstructions of the cervical spine were also generated.  COMPARISON:  None.  FINDINGS: CT HEAD FINDINGS  Acute subarachnoid hemorrhage overlies the left frontal lobe, image 18/series  2. No additional areas of hemorrhage identified. There is prominence of the sulci and ventricles consistent with brain atrophy. The paranasal stress set there is partial opacification of the sphenoid sinus. The paranasal sinuses are clear. The mastoid air cells are clear. There is a depressed skull fracture along the posterior midline overlying the superior sagittal sinus, image 54/series 3. No additional fractures or subluxations identified.  CT CERVICAL SPINE FINDINGS  Normal alignment of the cervical spine. The vertebral body heights are well preserved. There is disc space narrowing and ventral endplate spurring at S1-7 and C6-7. The facet joints are all aligned. No fracture or subluxation identified.  IMPRESSION: 1. Acute subarachnoid hemorrhage overlies the left frontal lobe. 2. Posterior calvarium depressed skull fracture which are overlies the superior sagittal  sinus.   Electronically Signed   By: Kerby Moors M.D.   On: 08/10/2014 16:48   2D Echocardiogram 6.25.2016  Study Conclusions  - Left ventricle: The cavity size was normal. Wall thickness was   increased in a pattern of mild LVH. Systolic function was normal.   The estimated ejection fraction was in the range of 60% to 65%.   Wall motion was normal; there were no regional wall motion   abnormalities. Indeterminant diastolic function (atrial   fibrillation). - Aortic valve: There was no stenosis. - Mitral valve: There was trivial regurgitation. - Left atrium: The atrium was mildly dilated. - Right ventricle: The cavity size was normal. Systolic function   was normal. - Right atrium: The atrium was mildly dilated. - Tricuspid valve: Peak RV-RA gradient (S): 13 mm Hg. - Pulmonary arteries: PA peak pressure: 28 mm Hg (S). - Systemic veins: IVC measured 2.4 cm with < 50% respirophasic   variation, suggesting RA pressure 15 mmHg. - Pericardium, extracardiac: A trivial pericardial effusion was   identified. _____________     ASSESSMENT AND PLAN  1.  Syncope:  Pt admitted following sudden syncope, fall, skull fx, subarachnoid hemorrhage, and afib rvr.  Converted to sinus rhythm this AM w/o significant post-termination pause.  Echo showed nl EF w/o AS.  Will ask EP to see given new AF with need for AVN blocking agents in setting of baseline sinus brady (was in 40's on last office visit).  2.  Afib RVR/history of sinus brady w/ first deg AVB/tachy0-brady syndrome:  See #1.  Converted to sinus this AM.  Currently on IV dilt.  Will d/c.  Reluctant to add oral AVN blocking agent given h/o sinus brady.  CHA2DS2VASc = 4.  No anticoagulation at this time in setting of [place].  3.  Subarachnoid hemorrhage, left frontal lobe: No complaints or neuro deficits this AM.  Mgmt per IM/NSU.  4.  Hypothyroidism:  TSH mildly suppressed.  Per IM.    Signed, Murray Hodgkins NP

## 2014-08-12 NOTE — Progress Notes (Signed)
Transferred to [facility] 17 via w/c and CCM

## 2014-08-12 NOTE — Progress Notes (Signed)
Pt admitted to [facility] room. Pt A/o x2.repeats questions and thinks she is in [facility]. VS taken, stable. Place don heart monitor and called to central tele.Bed alarm placed. Will cont to monitor. Regino Bellow
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
## 2014-08-12 NOTE — Progress Notes (Addendum)
CCM called RN and stated pt converted to NSR around 0400. EKG was obtained. Fredirick Maudlin, NP, was notified.  MD, Eula Fried, card fellow in-house, was notified.

## 2014-08-12 NOTE — Progress Notes (Signed)
RASHAWN ROLON MAU:[id number] DOB: Jul 06, 1927 DOA: 08/10/2014 PCP: Vidal Schwalbe, MD  Brief narrative:  81 y/o ? 1deg AVB +sinus brady-s/p holter monitor, prior knee surgery, hypothyrodoi s/p FNAC sugestive of thyroiditis 12/2012, Osteoporosis. Patient in her usual state of health until around 1400 08/10/14 Patient had been working in the basement at her nephew's house moving around a couple of things and had an unwitnessed fall as well as laceration to the back of the head. She did not remember antecedent events immediately prior or after the fall. It is unclear whether there was any seizure activity and how long she might have been confused after the fall She had a lot of bleeding and EMS was deployed and patient was brought to the emergency room. Patient was noted to have an acute subarachnoid hemorrhage over the left frontal lobe with the posterior calvaria and depressed skull fracture overlying the superior sagittal sinus and was also found to be in atrial fibrillation with RVR and was admitted to step down unit and placed on Cardizem drip She converted to NSR 6/25 and was noted to have ~ 2 sec pauses. Cardiology consulted for rec for Tachy-brady/sss For ppm implant tentatively 6/27 am  Past medical history-As per Problem list Chart reviewed as below-   Consultants:  none  Procedures:  none  Antibiotics:  none   Subjective   Confused o/n needing restraints Much better now Oriented to place and person Nephew just left Undertsand sneed for am procedure No cp/n/v/subj feeling tachy No sob   Objective    Interim History:   Telemetry: Atrial fibrillation rates 80s to 90s   Objective: Filed Vitals:   08/12/14 0600 08/12/14 0630 08/12/14 0752 08/12/14 1218  BP: 134/72 129/67 129/62 114/45  Pulse: 62 60 57 60  Temp:   98 F (36.7 C) 98.7 F (37.1 C)  TempSrc:   Oral Oral  Resp: 15 22 23 15   Height:      Weight:      SpO2: 94% 96% 98% 99%     Intake/Output Summary (Last 24 hours) at 08/12/14 1321 Last data filed at 08/12/14 1216  Gross per 24 hour  Intake    235 ml  Output    600 ml  Net   -365 ml    Exam:  General: EOMI NCAT Cardiovascular: S1-S2 no murmur rub or gallop RRR Respiratory: Clinically clear no added sound Abdomen: Soft nontender nondistended no rebound or guarding Skin no lower extremity edema Neuro grossly intact  Data Reviewed: Basic Metabolic Panel:  Recent Labs Lab 08/10/14 1539 08/11/14 0232  NA 140 138  K 4.1 3.7  CL 107 110  CO2 23 21*  GLUCOSE 112* 143*  BUN 14 11  CREATININE 0.84 0.66  CALCIUM 10.8* 9.8   Liver Function Tests:  Recent Labs Lab 08/10/14 1539 08/11/14 0232  AST 30 25  ALT 22 19  ALKPHOS 69 57  BILITOT 0.7 0.9  PROT 7.2 6.3*  ALBUMIN 4.1 3.3*   No results for input(s): LIPASE, AMYLASE in the last 168 hours. No results for input(s): AMMONIA in the last 168 hours. CBC:  Recent Labs Lab 08/10/14 1539 08/11/14 0232 08/12/14 0839  WBC 11.4* 14.6* 9.3  NEUTROABS 10.1* 12.8* 7.8*  HGB 14.9 13.5 13.7  HCT 44.5 39.9 40.7  MCV 92.5 92.6 92.3  PLT 173 170 151   Cardiac Enzymes: No results for input(s): CKTOTAL, CKMB, CKMBINDEX, TROPONINI in the last 168 hours. BNP: Invalid input(s): POCBNP CBG:  Recent Labs  Lab 08/11/14 1248 08/11/14 1802 08/11/14 2147 08/12/14 0752 08/12/14 1221  GLUCAP 131* 125* 150* 117* 101*    Recent Results (from the past 240 hour(s))  MRSA PCR Screening     Status: None   Collection Time: 08/10/14  8:50 PM  Result Value Ref Range Status   MRSA by PCR NEGATIVE NEGATIVE Final    Comment:        The GeneXpert MRSA Assay (FDA approved for NASAL specimens only), is one component of a comprehensive MRSA colonization surveillance program. It is not intended to diagnose MRSA infection nor to guide or monitor treatment for MRSA infections.      Studies:              All Imaging reviewed and is as per above  notation   Scheduled Meds: . ketorolac  1 drop Right Eye QID  . levothyroxine  75 mcg Oral QAC breakfast  . ofloxacin  1 drop Right Eye QID  . pantoprazole (PROTONIX) IV  40 mg Intravenous QHS  . prednisoLONE acetate  1 drop Right Eye QID  . senna-docusate  1 tablet Oral BID   Continuous Infusions:     Assessment/Plan:  Principal Problem:    Tachycardia with sick sinus syndrome, CHad2Vasc2=4, HASBLED=1 Appreciate Cardiology/EP input. Agree vc plan for tentative PPM placement NO rate controlling agent for now per Cardiology  Active Problems:   Atrial fibrillation with RVR-see above-monitor on tele   SAH (subarachnoid hemorrhage)-no role for anticoagulation currently given acute subarachnoid   Syncope-see above-we will keep the patient on telemetry   S/P cataract surgery, right eye   Hypothyroidism-TSH 0.349-ucnlear if Afib being driven by heprtheyroid state-cut back Continue Synthroid 75 g daily-->50MCg dailu on 6/26   Stenosed aortic valve-per cardiology   Lichen sclerosus    Code Status: Full code Family Communication: None present Disposition Plan: tx to tele   Verneita Griffes, MD  [facility] Pager [phone] 08/12/2014, 1:21 PM    LOS: 2 days

## 2014-08-12 NOTE — Progress Notes (Signed)
In room on rounds and restraints removed at this time. Bed alarm on and fall pads on floor for safety.

## 2014-08-12 NOTE — Progress Notes (Signed)
Pt stated to RN that her neck was sore. RN pulled pain PRN medication but when RN returned to the room, the pt refused to take it. She questioned why she need to take the medication and if other people was getting the same medication. She stated that it was poison and that RN was trying to poison herself. Medication was wasted.  Aroma therapy was administered to help calm pt.
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
## 2014-08-12 NOTE — Consult Note (Signed)
ELECTROPHYSIOLOGY CONSULT NOTE    Primary Care Physician: Vidal Schwalbe, MD Referring Physician:  Dr Aundra Dubin  Admit Date: 08/10/2014  Reason for consultation: syncope and bradycardia  Kristine Holden is a 81 y.o. female with a h/o prior sinus bradycardia and first degree AV block now admitted with syncope.  She is quite active for her age.  She reports that she was working in her relatives garage when she had abrupt syncope.  She reports that she had been walking around for quite some time and was feeling normal.  The next thing she new, she had collapsed and was bleeding from her head. She denies any associated CP, palpitations, or other symptoms.  He presented to [facility] and was found to have Afib with RVR and SAH of frontal lobe and depressed skull fracture. She has been observed on telemetry and has had afib with RVR (HR 130s) as well as pauses of >2 seconds.  She has chronically had sinus bradycardia and first degree AV block. This is her first episode of syncope.  Today, she denies symptoms of palpitations, chest pain, shortness of breath, orthopnea, PND, lower extremity edema, or neurologic sequela.  She is mostly unaware of her afib.  The patient is tolerating medications without difficulties and is otherwise without complaint today.   Past Medical History  Diagnosis Date  . Hypothyroidism   . Arthritis   . Hypothyroidism     left thyroid  . OA (osteoarthritis)     hands  . Lichen sclerosus   . Vitamin D deficiency   . Osteoporosis   . Sick sinus syndrome   . First degree AV block   . Paroxysmal atrial fibrillation    Past Surgical History  Procedure Laterality Date  . Hand surgery  20years ago    open dislocation  . Abdominal hysterectomy  1974  . Appendectomy  1951  . Tonsillectomy  1942    x2  . Total knee arthroplasty  01/25/2012    Procedure: TOTAL KNEE ARTHROPLASTY;  Surgeon: Gearlean Alf, MD;  Location: [facility] ORS;  Service: Orthopedics;  Laterality: Right;     . ketorolac  1 drop Right Eye QID  . levothyroxine  75 mcg Oral QAC breakfast  . ofloxacin  1 drop Right Eye QID  . pantoprazole (PROTONIX) IV  40 mg Intravenous QHS  . prednisoLONE acetate  1 drop Right Eye QID  . senna-docusate  1 tablet Oral BID      Allergies  Allergen Reactions  . Augmentin [Amoxicillin-Pot Clavulanate] Nausea And Vomiting  . Quinine Derivatives   . Penicillins Rash    History   Social History  . Marital Status: Single    Spouse Name: N/A  . Number of Children: N/A  . Years of Education: N/A   Occupational History  . Not on file.   Social History Main Topics  . Smoking status: Never Smoker   . Smokeless tobacco: Never Used  . Alcohol Use: No  . Drug Use: No  . Sexual Activity: Not on file   Other Topics Concern  . Not on file   Social History Narrative    Family History  Problem Relation Age of Onset  . Heart attack Father   . Heart disease Father     ROS- All systems are reviewed and negative except as per the HPI above  Physical Exam: Telemetry: Filed Vitals:   08/12/14 0600 08/12/14 0630 08/12/14 0752 08/12/14 1218  BP: 134/72 129/67 129/62 114/45  Pulse: 62  60 57 60  Temp:   98 F (36.7 C) 98.7 F (37.1 C)  TempSrc:   Oral Oral  Resp: 15 22 23 15   Height:      Weight:      SpO2: 94% 96% 98% 99%    GEN- The patient is elderly but pleasant appearing, alert and oriented x 3 today.   Head- + hematoma on occiput Eyes-  Sclera clear, conjunctiva pink Ears- hearing intact Oropharynx- clear Neck- supple,   Lungs- Clear to ausculation bilaterally, normal work of breathing Heart- irregular rate and rhythm, no murmurs  GI- soft, NT, ND, + BS Extremities- no clubbing, cyanosis, or edema MS- no significant deformity or atrophy Skin- no rash or lesion Psych- euthymic mood, full affect Neuro- strength and sensation are intact  EKG multiple ekgs are reviewed in epic  Labs:   Lab Results  Component Value Date   WBC  9.3 08/12/2014   HGB 13.7 08/12/2014   HCT 40.7 08/12/2014   MCV 92.3 08/12/2014   PLT 151 08/12/2014    Recent Labs Lab 08/11/14 0232  NA 138  K 3.7  CL 110  CO2 21*  BUN 11  CREATININE 0.66  CALCIUM 9.8  PROT 6.3*  BILITOT 0.9  ALKPHOS 57  ALT 19  AST 25  GLUCOSE 143*   No results found for: CKTOTAL, CKMB, CKMBINDEX, TROPONINI No results found for: CHOL No results found for: HDL No results found for: LDLCALC No results found for: TRIG No results found for: CHOLHDL No results found for: LDLDIRECT    Radiology: head CT reveals acute subarachnoid hemarrhage  Echo: reveals preserved EF, no aortic stenosis or significant valvular issues  ASSESSMENT AND PLAN:   1. Syncope Likely due to sick sinus/ symptomatic bradycardia She has been previously documented to have both sinus bradycardia as well as degenerative conduction system disease with prolonged PR interval.  Dr Marlou Porch' previous comments that she was not a pacemaker candidate I think refers to the fact that at that time she was asymptomatic.  She now has developed syncope which I believe is due to prolonged RR intervals/ bradycardia.  The patient has symptomatic bradycardia.  I would therefore recommend pacemaker implantation at this time.  Risks, benefits, alternatives to pacemaker implantation were discussed in detail with the patient today. The patient understands that the risks include but are not limited to bleeding, infection, pneumothorax, perforation, tamponade, vascular damage, renal failure, MI, stroke, death,  and lead dislodgement and wishes to proceed. We will therefore schedule the procedure at the next available time.  I have tentatively scheduled with Dr Caryl Comes for tomorrow afternoon.  Will make NPO after breakfast in the am.  2. [facility] Medical therapy per neurosurgery.  Repeat ct is recommended.  3. afib Will require rate control given episodic RVR.  Presently, we cannot adequately rate control due to prior AV  conduction disease and sinus bradycardia.  Will follow post PPM implant. Would avoid anticoagulation until after [facility] is completely healed    Thompson Grayer, MD 08/12/2014  12:58 PM

## 2014-08-13 ENCOUNTER — Encounter (HOSPITAL_COMMUNITY)
Admission: EM | Disposition: A | Discharge status: Home Health | Payer: Medicare Other | Source: Home / Self Care | Attending: Family Medicine

## 2014-08-13 DIAGNOSIS — I495 Sick sinus syndrome: Secondary | ICD-10-CM

## 2014-08-13 HISTORY — PX: EP IMPLANTABLE DEVICE: SHX172B

## 2014-08-13 LAB — GLUCOSE, CAPILLARY
GLUCOSE-CAPILLARY: 86 mg/dL (ref 65–99)
GLUCOSE-CAPILLARY: 78 mg/dL (ref 65–99)

## 2014-08-13 SURGERY — PACEMAKER IMPLANT
Anesthesia: LOCAL

## 2014-08-13 MED ORDER — CHLORHEXIDINE GLUCONATE 4 % EX LIQD
60.0000 mL | Freq: Once | CUTANEOUS | Status: AC
  Administered 2014-08-13: 4 via TOPICAL
  Filled 2014-08-13 (×2): qty 60

## 2014-08-13 MED ORDER — MIDAZOLAM HCL 5 MG/5ML IJ SOLN
INTRAMUSCULAR | Status: DC | PRN
  Administered 2014-08-13 (×2): 1 mg via INTRAVENOUS

## 2014-08-13 MED ORDER — VANCOMYCIN HCL IN DEXTROSE 1-5 GM/200ML-% IV SOLN
1000.0000 mg | Freq: Once | INTRAVENOUS | Status: DC
  Filled 2014-08-13: qty 200

## 2014-08-13 MED ORDER — LIDOCAINE HCL (PF) 1 % IJ SOLN
INTRAMUSCULAR | Status: AC
  Filled 2014-08-13: qty 60

## 2014-08-13 MED ORDER — GENTAMICIN SULFATE 40 MG/ML IJ SOLN: 80.0000 mg | INTRAMUSCULAR | Status: DC

## 2014-08-13 MED ORDER — VANCOMYCIN HCL IN DEXTROSE 1-5 GM/200ML-% IV SOLN
1000.0000 mg | INTRAVENOUS | Status: DC
  Filled 2014-08-13: qty 200

## 2014-08-13 MED ORDER — VANCOMYCIN HCL IN DEXTROSE 1-5 GM/200ML-% IV SOLN
1000.0000 mg | Freq: Two times a day (BID) | INTRAVENOUS | Status: AC
  Administered 2014-08-14: 1000 mg via INTRAVENOUS
  Filled 2014-08-13: qty 200

## 2014-08-13 MED ORDER — ONDANSETRON HCL 4 MG/2ML IJ SOLN: 4.0000 mg | Freq: Four times a day (QID) | INTRAMUSCULAR | Status: DC | PRN

## 2014-08-13 MED ORDER — SODIUM CHLORIDE 0.9 % IV SOLN
INTRAVENOUS | Status: DC
  Administered 2014-08-13: 07:00:00 via INTRAVENOUS

## 2014-08-13 MED ORDER — CHLORHEXIDINE GLUCONATE 4 % EX LIQD
60.0000 mL | Freq: Once | CUTANEOUS | Status: AC
  Administered 2014-08-13: 4 via TOPICAL
  Filled 2014-08-13: qty 60

## 2014-08-13 MED ORDER — ACETAMINOPHEN 325 MG PO TABS: 325.0000 mg | ORAL_TABLET | ORAL | Status: DC | PRN

## 2014-08-13 MED ORDER — METOPROLOL TARTRATE 25 MG PO TABS
25.0000 mg | ORAL_TABLET | Freq: Two times a day (BID) | ORAL | Status: DC
  Administered 2014-08-13 – 2014-08-14 (×2): 25 mg via ORAL
  Filled 2014-08-13 (×4): qty 1

## 2014-08-13 MED ORDER — OFF THE BEAT BOOK
Freq: Once | Status: AC
  Administered 2014-08-13: 10:00:00
  Filled 2014-08-13: qty 1

## 2014-08-13 MED ORDER — LIDOCAINE HCL (PF) 1 % IJ SOLN
INTRAMUSCULAR | Status: DC | PRN
  Administered 2014-08-13: 50 mL

## 2014-08-13 MED ORDER — VANCOMYCIN HCL 1000 MG IV SOLR
1.0000 g | INTRAVENOUS | Status: DC | PRN
  Administered 2014-08-13: 1 g via INTRAVENOUS

## 2014-08-13 MED ORDER — FENTANYL CITRATE (PF) 100 MCG/2ML IJ SOLN
INTRAMUSCULAR | Status: AC
  Filled 2014-08-13: qty 2

## 2014-08-13 MED ORDER — SODIUM CHLORIDE 0.9 % IV SOLN
INTRAVENOUS | Status: AC
  Administered 2014-08-13: 17:00:00 via INTRAVENOUS

## 2014-08-13 MED ORDER — SODIUM CHLORIDE 0.45 % IV SOLN
INTRAVENOUS | Status: DC
  Administered 2014-08-13: 13:00:00 via INTRAVENOUS

## 2014-08-13 MED ORDER — MIDAZOLAM HCL 5 MG/5ML IJ SOLN
INTRAMUSCULAR | Status: AC
  Filled 2014-08-13: qty 5

## 2014-08-13 MED ORDER — FENTANYL CITRATE (PF) 100 MCG/2ML IJ SOLN
INTRAMUSCULAR | Status: DC | PRN
  Administered 2014-08-13: 25 ug via INTRAVENOUS

## 2014-08-13 SURGICAL SUPPLY — 8 items
CABLE SURGICAL S-101-97-12 (CABLE) ×1 IMPLANT
HEMOSTAT SURGICEL 2X4 FIBR (HEMOSTASIS) IMPLANT
LEAD ISOFLEX OPT 1944-46CM (Lead) ×1 IMPLANT
LEAD ISOFLEX OPT 1948-52CM (Lead) ×1 IMPLANT
PAD DEFIB LIFELINK (PAD) ×1 IMPLANT
PPM ASSURITY DR PM2240 (Pacemaker) ×1 IMPLANT
SHEATH CLASSIC 7F (SHEATH) ×2 IMPLANT
TRAY PACEMAKER INSERTION (CUSTOM PROCEDURE TRAY) ×1 IMPLANT

## 2014-08-13 NOTE — Evaluation (Signed)
Occupational Therapy Evaluation Patient Details Name: Kristine Holden MRN: [id number] DOB: 1927-07-27 Today's Date: 08/13/2014    History of Present Illness Kristine Holden is a 81 y.o. female with a history of 1st degree AV block, sinus bradycardia, carotid artery disease, hypothyroidism, mild dementia and OA presented to [facility] ED with Fall 08/10/14 and found to have Afib with RVR and SAH of frontal lobe and depressed skull fracture.   Clinical Impression   Pt was functioning independently prior to admission.  She presents with impaired cognition (unclear of her baseline) and decreased balance interfering with ability to perform ADL and ADL transfers.  Will follow acutely.  Pt with plan to have sister stay with her at discharge.     Follow Up Recommendations  Home health OT;Supervision/Assistance - 24 hour    Equipment Recommendations  None recommended by OT    Recommendations for Other Services       Precautions / Restrictions Precautions Precautions: Fall Restrictions Weight Bearing Restrictions: No      Mobility Bed Mobility Overal bed mobility: Modified Independent Bed Mobility: Supine to Sit;Sit to Supine              Transfers Overall transfer level: Needs assistance Equipment used: 1 person hand held assist Transfers: Sit to/from Stand Sit to Stand: Min guard              Balance                                            ADL Overall ADL's : Needs assistance/impaired Eating/Feeding: Independent;Sitting   Grooming: Minimal assistance;Standing;Wash/dry hands Grooming Details (indicate cue type and reason): cues for sequence, assist to use automatic soap dispenser Upper Body Bathing: Set up;Sitting   Lower Body Bathing: Min guard;Sit to/from stand   Upper Body Dressing : Set up;Sitting   Lower Body Dressing: Min guard;Sit to/from stand   Toilet Transfer: Minimal assistance;Ambulation;Regular Sport and exercise psychologist and Hygiene: Min guard;Sit to/from stand       Functional mobility during ADLs: Minimal assistance       Vision     Perception     Praxis      Pertinent Vitals/Pain Pain Assessment: No/denies pain     Hand Dominance Right   Extremity/Trunk Assessment Upper Extremity Assessment Upper Extremity Assessment: Overall WFL for tasks assessed (arthritic changes in hands)   Lower Extremity Assessment Lower Extremity Assessment: Defer to PT evaluation   Cervical / Trunk Assessment Cervical / Trunk Assessment: Normal   Communication Communication Communication: No difficulties   Cognition Arousal/Alertness: Awake/alert Behavior During Therapy: WFL for tasks assessed/performed Overall Cognitive Status: Impaired/Different from baseline Area of Impairment: Memory;Safety/judgement;Problem solving     Memory: Decreased short-term memory   Safety/Judgement: Decreased awareness of safety;Decreased awareness of deficits   Problem Solving: Slow processing;Difficulty sequencing;Requires verbal cues     General Comments       Exercises       Shoulder Instructions      Home Living Family/patient expects to be discharged to:: Private residence Living Arrangements: Other relatives Available Help at Discharge: Family;Available 24 hours/day (sister will either come to her home, or she will go there) Type of Home: House Home Access: Stairs to enter Entrance Stairs-Number of Steps: 2 Entrance Stairs-Rails: None Home Layout: One level     Bathroom Shower/Tub: Hospital doctor  Toilet:  (comfort height at pt's home)     Home Equipment: Kasandra Knudsen - single point   Additional Comments: pt drives  Lives With: Alone    Prior Functioning/Environment Level of Independence: Independent             OT Diagnosis: Generalized weakness;Cognitive deficits   OT Problem List: Impaired balance (sitting and/or standing);Decreased cognition;Decreased safety  awareness;Decreased knowledge of use of DME or AE   OT Treatment/Interventions: Self-care/ADL training;Therapeutic activities;DME and/or AE instruction;Balance training;Patient/family education;Cognitive remediation/compensation    OT Goals(Current goals can be found in the care plan section) Acute Rehab OT Goals Patient Stated Goal: to go home OT Goal Formulation: With patient Time For Goal Achievement: 08/20/14 Potential to Achieve Goals: Good ADL Goals Pt Will Perform Grooming: with modified independence;standing Pt Will Perform Lower Body Bathing: with modified independence;sit to/from stand Pt Will Perform Lower Body Dressing: with modified independence;sit to/from stand Pt Will Transfer to Toilet: with modified independence;ambulating;regular height toilet Pt Will Perform Toileting - Clothing Manipulation and hygiene: with modified independence;sit to/from stand Pt Will Perform Tub/Shower Transfer: Tub transfer;Shower transfer;ambulating;rolling walker (tub vs shower depending on d/c disposition)  OT Frequency: Min 2X/week   Barriers to D/C:            Co-evaluation              End of Session Equipment Utilized During Treatment: Gait belt  Activity Tolerance: Patient tolerated treatment well Patient left: in bed;with call bell/phone within reach;with bed alarm set   Time: 1194-1740 OT Time Calculation (min): 26 min Charges:  OT General Charges $OT Visit: 1 Procedure OT Evaluation $Initial OT Evaluation Tier I: 1 Procedure OT Treatments $Self Care/Home Management : 8-22 mins G-Codes:    Malka So 08/13/2014, 11:05 AM [phone]

## 2014-08-13 NOTE — Progress Notes (Signed)
Kristine Holden:[id number] DOB: 02/26/1927 DOA: 08/10/2014 PCP: Vidal Schwalbe, MD  Brief narrative:  81 y/o ? 1deg AVB +sinus brady-s/p holter monitor, prior knee surgery, hypothyrodoi s/p FNAC sugestive of thyroiditis 12/2012, Osteoporosis. Patient in her usual state of health until around 1400 08/10/14 Patient had been working in the basement at her nephew's house moving around a couple of things and had an unwitnessed fall as well as laceration to the back of the head. She did not remember antecedent events immediately prior or after the fall. It is unclear whether there was any seizure activity and how long she might have been confused after the fall She had a lot of bleeding and EMS was deployed and patient was brought to the emergency room. Patient was noted to have an acute subarachnoid hemorrhage over the left frontal lobe with the posterior calvaria and depressed skull fracture overlying the superior sagittal sinus and was also found to be in atrial fibrillation with RVR and was admitted to step down unit and placed on Cardizem drip She converted to NSR 6/25 and was noted to have ~ 2 sec pauses. Cardiology consulted for rec for Tachy-brady/sss Ppm implanted  6/27 am by Dr. Caryl Comes  Past medical history-As per Problem list Chart reviewed as below-   Consultants:  none  Procedures:  none  Antibiotics:  none   Subjective   Fair Just back from procedure   Objective    Interim History:   Telemetry: Atrial fibrillation rates 80s to 90s   Objective: Filed Vitals:   08/13/14 1124 08/13/14 1428 08/13/14 1642 08/13/14 1703  BP: 137/60 163/67 184/75 179/78  Pulse: 58 52 60 59  Temp: 98.3 F (36.8 C) 97.8 F (36.6 C) 98.4 F (36.9 C)   TempSrc: Oral Oral Oral   Resp: 18 18 18    Height:      Weight:      SpO2: 100% 100% 100%     Intake/Output Summary (Last 24 hours) at 08/13/14 1706 Last data filed at 08/13/14 1031  Gross per 24 hour  Intake    840 ml    Output   1300 ml  Net   -460 ml    Exam:  General: EOMI NCAT Cardiovascular: S1-S2 no murmur rub or gallop RRR Respiratory: Clinically clear no added sound Abdomen: Soft nontender nondistended no rebound or guarding Skin no lower extremity edema Neuro grossly intact  Data Reviewed: Basic Metabolic Panel:  Recent Labs Lab 08/10/14 1539 08/11/14 0232  NA 140 138  K 4.1 3.7  CL 107 110  CO2 23 21*  GLUCOSE 112* 143*  BUN 14 11  CREATININE 0.84 0.66  CALCIUM 10.8* 9.8   Liver Function Tests:  Recent Labs Lab 08/10/14 1539 08/11/14 0232  AST 30 25  ALT 22 19  ALKPHOS 69 57  BILITOT 0.7 0.9  PROT 7.2 6.3*  ALBUMIN 4.1 3.3*   No results for input(s): LIPASE, AMYLASE in the last 168 hours. No results for input(s): AMMONIA in the last 168 hours. CBC:  Recent Labs Lab 08/10/14 1539 08/11/14 0232 08/12/14 0839  WBC 11.4* 14.6* 9.3  NEUTROABS 10.1* 12.8* 7.8*  HGB 14.9 13.5 13.7  HCT 44.5 39.9 40.7  MCV 92.5 92.6 92.3  PLT 173 170 151   Cardiac Enzymes: No results for input(s): CKTOTAL, CKMB, CKMBINDEX, TROPONINI in the last 168 hours. BNP: Invalid input(s): POCBNP CBG:  Recent Labs Lab 08/11/14 2147 08/12/14 0752 08/12/14 1221 08/13/14 1113 08/13/14 1644  GLUCAP 150* 117* 101*  86 78    Recent Results (from the past 240 hour(s))  MRSA PCR Screening     Status: None   Collection Time: 08/10/14  8:50 PM  Result Value Ref Range Status   MRSA by PCR NEGATIVE NEGATIVE Final    Comment:        The GeneXpert MRSA Assay (FDA approved for NASAL specimens only), is one component of a comprehensive MRSA colonization surveillance program. It is not intended to diagnose MRSA infection nor to guide or monitor treatment for MRSA infections.      Studies:              All Imaging reviewed and is as per above notation   Scheduled Meds: . ketorolac  1 drop Right Eye QID  . levothyroxine  50 mcg Oral QAC breakfast  . metoprolol tartrate  25 mg Oral  BID  . ofloxacin  1 drop Right Eye QID  . pantoprazole (PROTONIX) IV  40 mg Intravenous QHS  . prednisoLONE acetate  1 drop Right Eye QID  . senna-docusate  1 tablet Oral BID  . vancomycin  1,000 mg Intravenous Once  . [START ON 08/14/2014] vancomycin  1,000 mg Intravenous Q12H   Continuous Infusions: . sodium chloride 50 mL/hr at 08/13/14 1645     Assessment/Plan:  Principal Problem:    Tachycardia with sick sinus syndrome, CHad2Vasc2=4, HASBLED=1 Appreciate Cardiology/EP input.  PPM placement done 6/27 NO rate controlling agent for now per Cardiology  Active Problems:   Atrial fibrillation with RVR-see above-monitor on tele   SAH (subarachnoid hemorrhage)-no role for anticoagulation currently given acute subarachnoid   Syncope-see above-we will keep the patient on telemetry   S/P cataract surgery, right eye   Hypothyroidism-TSH 0.349-unclear if Afib being driven by hypertheyroid state-cut back Continue Synthroid 75 g daily-->50MCg dailu on 6/26   Stenosed aortic valve-per cardiology   Lichen sclerosus    Code Status: Full code Family Communication: discussed with sister and patient Disposition Plan:? D/c as per cardiology input  Verneita Griffes, MD  [facility] Pager [phone] 08/13/2014, 5:06 PM    LOS: 3 days

## 2014-08-13 NOTE — Progress Notes (Signed)
Physical Therapy Treatment Patient Details Name: Kristine Holden MRN: [id number] DOB: [birth year] Today's Date: 08/13/2014    History of Present Illness Kristine Holden is a 81 y.o. female with a history of 1st degree AV block, sinus bradycardia, carotid artery disease, hypothyroidism, mild dementia and OA presented to [facility] ED with Fall 08/10/14 and found to have Afib with RVR and SAH of frontal lobe and depressed skull fracture.    PT Comments    Performed Dynamic Gait Index to further assess balance and fall risk; Pt scored 16; DGI score of 19 or less is indicative of increased fall risk; will plan to walk with cane next session  Noted for pacemaker placement later today   Follow Up Recommendations  Supervision/Assistance - 24 hour;Home health PT     Equipment Recommendations  Cane    Recommendations for Other Services OT consult     Precautions / Restrictions Precautions Precautions: Fall Restrictions Weight Bearing Restrictions: No    Mobility  Bed Mobility Overal bed mobility: Modified Independent Bed Mobility: Supine to Sit;Sit to Supine              Transfers Overall transfer level: Needs assistance Equipment used: 1 person hand held assist Transfers: Sit to/from Stand Sit to Stand: Min guard         General transfer comment: Standing at sink upon arrival  Ambulation/Gait Ambulation/Gait assistance: Supervision;Min guard (with and without physical contact) Ambulation Distance (Feet): 200 Feet Assistive device: None Gait Pattern/deviations: Step-to pattern (erratic step width)     General Gait Details: Noted one significant step with scissoring initially, with small loss of balance from which Ms Cumbo recovered without physical assist; Close guard for safety after that   Stairs            Wheelchair Mobility    Modified Rankin (Stroke Patients Only)       Balance                                    Cognition  Arousal/Alertness: Awake/alert Behavior During Therapy: WFL for tasks assessed/performed Overall Cognitive Status: Impaired/Different from baseline Area of Impairment: Memory;Safety/judgement;Problem solving     Memory: Decreased short-term memory   Safety/Judgement: Decreased awareness of safety;Decreased awareness of deficits   Problem Solving: Slow processing;Difficulty sequencing;Requires verbal cues      Exercises      General Comments General comments (skin integrity, edema, etc.): DGI score of 19 or less is indicative of increased fall risk; will plan to walk with cane next session      Pertinent Vitals/Pain Pain Assessment: No/denies pain    Home Living Family/patient expects to be discharged to:: Private residence Living Arrangements: Other relatives Available Help at Discharge: Family;Available 24 hours/day (sister will either come to her home, or she will go there) Type of Home: House Home Access: Stairs to enter Entrance Stairs-Rails: None Home Layout: One level Home Equipment: Cane - single point Additional Comments: pt drives    Prior Function Level of Independence: Independent          PT Goals (current goals can now be found in the care plan section) Acute Rehab PT Goals Patient Stated Goal: to go home PT Goal Formulation: With patient Time For Goal Achievement: 08/25/14 Potential to Achieve Goals: Good Progress towards PT goals: Progressing toward goals    Frequency  Min 3X/week    PT Plan Current plan remains appropriate  Co-evaluation             End of Session Equipment Utilized During Treatment: Gait belt Activity Tolerance: Patient tolerated treatment well Patient left: in chair;with call bell/phone within reach     Time: 1022-1055 PT Time Calculation (min) (ACUTE ONLY): 33 min  Charges:  $Gait Training: 23-37 mins                    G Codes:      Roney Marion Hamff 08/13/2014, 11:41 AM  Roney Marion, [place] Pager [phone] Office [phone]

## 2014-08-13 NOTE — Progress Notes (Signed)
Subjective:  Up in hallway with PT, no complaints  Objective:  Vital Signs in the last 24 hours: Temp:  [97.5 F (36.4 C)-98.7 F (37.1 C)] 97.5 F (36.4 C) (06/27 0554) Pulse Rate:  [55-60] 59 (06/27 0554) Resp:  [15-20] 18 (06/27 0554) BP: (114-155)/(45-68) 143/68 mmHg (06/27 0554) SpO2:  [98 %-100 %] 98 % (06/27 0554) Weight:  [127 lb (57.607 kg)-128 lb 9.6 oz (58.333 kg)] 127 lb (57.607 kg) (06/27 0557)  Intake/Output from previous day:  Intake/Output Summary (Last 24 hours) at 08/13/14 1102 Last data filed at 08/13/14 1031  Gross per 24 hour  Intake    840 ml  Output   1700 ml  Net   -860 ml    Physical Exam: General appearance: alert, cooperative and no distress Lungs: clear to auscultation bilaterally Heart: regular rate and rhythm Extremities: extremities normal, atraumatic, no cyanosis or edema Skin: Skin color, texture, turgor normal. No rashes or lesions Neurologic: Grossly normal   Rate: 58  Rhythm: sinus bradycardia  Lab Results:  Recent Labs  08/11/14 0232 08/12/14 0839  WBC 14.6* 9.3  HGB 13.5 13.7  PLT 170 151    Recent Labs  08/10/14 1539 08/11/14 0232  NA 140 138  K 4.1 3.7  CL 107 110  CO2 23 21*  GLUCOSE 112* 143*  BUN 14 11  CREATININE 0.84 0.66   No results for input(s): TROPONINI in the last 72 hours.  Invalid input(s): CK, MB  Recent Labs  08/11/14 0232  INR 1.11    Scheduled Meds: . chlorhexidine  60 mL Topical Once  . chlorhexidine  60 mL Topical Once  . gentamicin irrigation  80 mg Irrigation On Call  . ketorolac  1 drop Right Eye QID  . levothyroxine  50 mcg Oral QAC breakfast  . off the beat book   Does not apply Once  . ofloxacin  1 drop Right Eye QID  . pantoprazole (PROTONIX) IV  40 mg Intravenous QHS  . prednisoLONE acetate  1 drop Right Eye QID  . senna-docusate  1 tablet Oral BID  . vancomycin  1,000 mg Intravenous To Cath   Continuous Infusions: . sodium chloride    . sodium chloride      PRN Meds:.acetaminophen **OR** acetaminophen, traMADol   Imaging: Imaging results have been reviewed  Cardiac Studies: Echo  Study Conclusions  - Left ventricle: The cavity size was normal. Wall thickness was increased in a pattern of mild LVH. Systolic function was normal. The estimated ejection fraction was in the range of 60% to 65%. Wall motion was normal; there were no regional wall motion abnormalities. Indeterminant diastolic function (atrial fibrillation). - Aortic valve: There was no stenosis. - Mitral valve: There was trivial regurgitation. - Left atrium: The atrium was mildly dilated. - Right ventricle: The cavity size was normal. Systolic function was normal. - Right atrium: The atrium was mildly dilated. - Tricuspid valve: Peak RV-RA gradient (S): 13 mm Hg. - Pulmonary arteries: PA peak pressure: 28 mm Hg (S). - Systemic veins: IVC measured 2.4 cm with < 50% respirophasic variation, suggesting RA pressure 15 mmHg. - Pericardium, extracardiac: A trivial pericardial effusion was identified.  Impressions:  - The patient was in atrial fibrillation. Normal LV size and systolic function, EF 85-88%. Normal RV size and systolic function. No significant valvular abnormalities. Dilated IVC.  Assessment/Plan:  80 y.o. female with a h/o prior sinus bradycardia and first degree AV block admitted 08/10/14 with syncope. She is quite  active for her age, lives alone in an apartment and still drives.  She reports that she was working in her relatives garage when she had abrupt syncope. She reports that she had been walking around for quite some time and was feeling normal. The next thing she new, she had collapsed and was bleeding from her head. She presented to [facility] and was found to have Afib with RVR as well as a SAH of frontal lobe and depressed skull fracture. She has been observed on telemetry and has had afib with RVR (HR 130s) as well as pauses of  >2 seconds. She has chronically had sinus bradycardia and first degree AV block. This is her first episode of syncope.  Principal Problem:   Syncope and collapse Active Problems:   Sinus bradycardia   Atrial fibrillation with RVR   Tachycardia with sick sinus syndrome, CHad2Vasc2=3, HASBLED=1   SAH (subarachnoid hemorrhage)   Fracture of skull   PLAN:   PTVDP today. Abnormal TSH per primary service.   Kerin Ransom PA-C 08/13/2014, 11:02 AM [phone]  reveiwd tachy brady indication for pacing Not sure that is is releated to syncope, particularly as has prodrome with prior hx of presyncope but we will see  neurosurg comments quoted in [facility] but not subseqwuently involved as best as I can see  The benefits and risks were reviewed including but not limited to death,  perforation, infection, lead dislodgement and device malfunction.  The patient understands agrees and is willing to proceed.

## 2014-08-13 NOTE — Evaluation (Signed)
Speech Language Pathology Evaluation Patient Details Name: Kristine Holden MRN: [id number] DOB: Nov 10, 1927 Today's Date: 08/13/2014 Time: 2094-7096 SLP Time Calculation (min) (ACUTE ONLY): 29 min  Problem List:  Patient Active Problem List   Diagnosis Date Noted  . 1St degree AV block 08/12/2014  . Hypothyroidism 08/11/2014  . Stenosed aortic valve 08/11/2014  . Lichen sclerosus 28/36/6294  . Tachycardia with sick sinus syndrome, CHad2Vasc2=3, HASBLED=1 08/11/2014  . Fracture of skull   . Atrial fibrillation with RVR 08/10/2014  . SAH (subarachnoid hemorrhage) 08/10/2014  . Syncope 08/10/2014  . S/P cataract surgery, right eye 08/10/2014  . First degree AV block 01/04/2014  . Primary osteoarthritis of left knee 01/04/2014  . Sinus bradycardia 01/04/2014  . Left ear pain 01/04/2014  . Postop Acute blood loss anemia 01/26/2012  . OA (osteoarthritis) of knee 01/25/2012   Past Medical History:  Past Medical History  Diagnosis Date  . Hypothyroidism   . Arthritis   . Hypothyroidism     left thyroid  . OA (osteoarthritis)     hands  . Lichen sclerosus   . Vitamin D deficiency   . Osteoporosis   . Sick sinus syndrome   . First degree AV block   . Paroxysmal atrial fibrillation    Past Surgical History:  Past Surgical History  Procedure Laterality Date  . Hand surgery  20years ago    open dislocation  . Abdominal hysterectomy  1974  . Appendectomy  1951  . Tonsillectomy  1942    x2  . Total knee arthroplasty  01/25/2012    Procedure: TOTAL KNEE ARTHROPLASTY;  Surgeon: Gearlean Alf, MD;  Location: [facility] ORS;  Service: Orthopedics;  Laterality: Right;   HPI:  81 y.o. female with past medical history of hypothyroidism, aortic stenosis admitted with fall. CT head revealed subarachnoid hemorrhage of the frontal lobe.   Assessment / Plan / Recommendation Clinical Impression  Pt demonstrated mild cognitive impairments on Cognistat memory subtest (2/4 words without assist) as  well as demonstrated difficulty during recall of functional information (basic, biographical). Named 5 animals during divergent naming task. Exhibited impairment with verbal problem solving and unable to state "911" and required 3 verbal cues to recall. She will need assist/supervision initially for monitoring/assist for more complex problem solving at home (meds, handling fincances) and recommend [place]. Speech will see while in hospital.    SLP Assessment  Patient needs continued Speech Lanaguage Pathology Services    Follow Up Recommendations  Home health SLP    Frequency and Duration min 2x/week  2 weeks   Pertinent Vitals/Pain Pain Assessment: No/denies pain   SLP Goals  Potential to Achieve Goals (ACUTE ONLY): Good  SLP Evaluation Prior Functioning  Cognitive/Linguistic Baseline: Information not available Type of Home: House (townhouse ?)  Lives With: Alone   Cognition  Overall Cognitive Status: Impaired/Different from baseline Arousal/Alertness: Awake/alert Orientation Level: Oriented to situation;Oriented to place;Oriented to person;Disoriented to time Attention: Sustained Sustained Attention: Appears intact Memory: Impaired Memory Impairment:  (difficult immediate recall, 2 min delay 2/4 word) Awareness: Impaired Awareness Impairment: Anticipatory impairment Problem Solving: Impaired Problem Solving Impairment: Verbal basic Executive Function: Self Correcting;Self Monitoring;Organizing Safety/Judgment:  (questionable)    Comprehension  Auditory Comprehension Overall Auditory Comprehension: Impaired Yes/No Questions: Within Functional Limits Commands: Impaired Multistep Basic Commands: 75-100% accurate Interfering Components: Working Web designer Discrimination: Not tested Reading Comprehension Reading Status: Not tested    Expression Expression Primary Mode of Expression: Verbal Verbal Expression Overall  Verbal  Expression: Appears within functional limits for tasks assessed Pragmatics: No impairment Written Expression Dominant Hand: Right Written Expression: Within Functional Limits (at work level)   Oral / Motor Oral Motor/Sensory Function Overall Oral Motor/Sensory Function: Appears within functional limits for tasks assessed Motor Speech Overall Motor Speech: Appears within functional limits for tasks assessed Intelligibility: Intelligible Motor Planning: Witnin functional limits   GO     Houston Siren 08/13/2014, 10:03 AM   Orbie Pyo Colvin Caroli.Ed [employer] [phone]

## 2014-08-14 ENCOUNTER — Inpatient Hospital Stay (HOSPITAL_COMMUNITY): Payer: Medicare Other

## 2014-08-14 ENCOUNTER — Encounter (HOSPITAL_COMMUNITY): Payer: Self-pay | Admitting: Internal Medicine

## 2014-08-14 ENCOUNTER — Other Ambulatory Visit: Payer: Self-pay

## 2014-08-14 LAB — CBC
HCT: 39.5 % (ref 36.0–46.0)
Hemoglobin: 13.7 g/dL (ref 12.0–15.0)
MCH: 31.6 pg (ref 26.0–34.0)
MCHC: 34.7 g/dL (ref 30.0–36.0)
MCV: 91 fL (ref 78.0–100.0)
PLATELETS: 156 10*3/uL (ref 150–400)
RBC: 4.34 MIL/uL (ref 3.87–5.11)
RDW: 13.1 % (ref 11.5–15.5)
WBC: 7.7 10*3/uL (ref 4.0–10.5)

## 2014-08-14 LAB — GLUCOSE, CAPILLARY
Glucose-Capillary: 102 mg/dL — ABNORMAL HIGH (ref 65–99)
Glucose-Capillary: 131 mg/dL — ABNORMAL HIGH (ref 65–99)

## 2014-08-14 MED ORDER — PANTOPRAZOLE SODIUM 40 MG PO TBEC
40.0000 mg | DELAYED_RELEASE_TABLET | Freq: Every day | ORAL | Status: DC
  Administered 2014-08-14: 40 mg via ORAL
  Filled 2014-08-14: qty 1

## 2014-08-14 MED ORDER — PANTOPRAZOLE SODIUM 40 MG PO TBEC: 40.0000 mg | DELAYED_RELEASE_TABLET | Freq: Every day | ORAL | Status: DC

## 2014-08-14 MED FILL — Sodium Chloride Irrigation Soln 0.9%: Qty: 500 | Status: AC

## 2014-08-14 MED FILL — Gentamicin Sulfate Inj 40 MG/ML: INTRAMUSCULAR | Qty: 2 | Status: AC

## 2014-08-14 NOTE — Care Management (Signed)
Important Message  Patient Details  Name: Kristine Holden MRN: [id number] Date of Birth: Feb 03, 1928   Medicare Important Message Given:       Pricilla Handler 08/14/2014, 1:10 PM

## 2014-08-14 NOTE — Discharge Summary (Signed)
Physician Discharge Summary  Kristine Holden LMB:[id number] DOB: 1927-03-03 DOA: 08/10/2014  PCP: Vidal Schwalbe, MD  Admit date: 08/10/2014 Discharge date: 08/14/2014  Time spent: 30 minutes  Recommendations for Outpatient Follow-up:  1. No AC for right now 2. Cardiology to address initiation of Anticoagulation as an OP 3. HH Pt recommended by PT 4. Get mag/bmet in 1 week 5. Check TSH in 3 weeks-putative etiology of Afib as was slightly hyperthyroid on admit   Discharge Diagnoses:  Principal Problem:   Syncope and collapse Active Problems:   Sinus bradycardia   Atrial fibrillation with RVR   SAH (subarachnoid hemorrhage)   S/P cataract surgery, right eye   Hypothyroidism   Lichen sclerosus   Tachycardia with sick sinus syndrome, CHad2Vasc2=3, HASBLED=1   Fracture of skull   1St degree AV block   Discharge Condition: fair   Diet recommendation:  Heart healthy low salt  Filed Weights   08/12/14 1604 08/13/14 0557 08/14/14 0516  Weight: 58.333 kg (128 lb 9.6 oz) 57.607 kg (127 lb) 57.788 kg (127 lb 6.4 oz)    History of present illness:   81 y/o ? 1deg AVB +sinus brady-s/p holter monitor, prior knee surgery, hypothyrodoi s/p FNAC sugestive of thyroiditis 12/2012, Osteoporosis. Patient in her usual state of health until around 1400 08/10/14 Patient had been working in the basement at her nephew's house moving around a couple of things and had an unwitnessed fall as well as laceration to the back of the head. She did not remember antecedent events immediately prior or after the fall. It is unclear whether there was any seizure activity and how long she might have been confused after the fall She had a lot of bleeding and EMS was deployed and patient was brought to the emergency room. Patient was noted to have an acute subarachnoid hemorrhage over the left frontal lobe with the posterior calvaria and depressed skull fracture overlying the superior sagittal sinus and was also  found to be in atrial fibrillation with RVR and was admitted to step down unit and placed on Cardizem drip She converted to NSR 6/25 and was noted to have ~ 2 sec pauses. Cardiology consulted for rec for Tachy-brady/sss Ppm implanted  6/27-Cleared by cardiology for d/c NS Dr. Vertell Limber recommended verbally to me no Anticoagulation ~ 1 week but no other recommendations re: subarachnoid. Dr. Rayann Heman aware of these recs and will coordinate OP follow-up with patient for PPM check and initation AC as OP  Discharge Exam: Filed Vitals:   08/14/14 1004  BP: 136/98  Pulse: 64  Temp: 98 F (36.7 C)  Resp: 20    General: alert pleasant nad Cardiovascular: s1 s 2no m/r/g Respiratory: clear  Discharge Instructions   Discharge Instructions    Diet - low sodium heart healthy    Complete by:  As directed      Discharge instructions    Complete by:  As directed   No blood thinner or aspirin until you are seen by cardiology Care with pacemaker site-do not rub Follow instructions as per cardiologist     Increase activity slowly    Complete by:  As directed           Current Discharge Medication List    START taking these medications   Details  pantoprazole (PROTONIX) 40 MG tablet Take 1 tablet (40 mg total) by mouth daily. Qty: 30 tablet, Refills: 0      CONTINUE these medications which have NOT CHANGED   Details  alendronate (FOSAMAX) 70 MG tablet Take 70 mg by mouth once a week.     calcium-vitamin D 250-100 MG-UNIT per tablet Take 1 tablet by mouth daily.     ketorolac (ACULAR) 0.4 % SOLN Place 1 drop into the right eye 4 (four) times daily.    levothyroxine (SYNTHROID, LEVOTHROID) 75 MCG tablet Take 75 mcg by mouth daily before breakfast.    Multiple Vitamins-Minerals (MEGA MULTI WOMEN PO) Take 1 tablet by mouth daily.     ofloxacin (OCUFLOX) 0.3 % ophthalmic solution Place 1 drop into the right eye 4 (four) times daily.    prednisoLONE acetate (PRED FORTE) 1 % ophthalmic  suspension Place 1 drop into the right eye 4 (four) times daily.      STOP taking these medications     aspirin 81 MG tablet        Allergies  Allergen Reactions  . Augmentin [Amoxicillin-Pot Clavulanate] Nausea And Vomiting  . Quinine Derivatives   . Penicillins Rash   Follow-up Information    Follow up with [facility] On 08/27/2014.   Why:  at 3:30PM for wound check   Contact information:   [address] [place] [address]       Follow up with Virl Axe, MD On 11/16/2014.   Specialty:  Cardiology   Why:  at 3:15PM   Contact information:   [address] [place] [address] [phone]        The results of significant diagnostics from this hospitalization (including imaging, microbiology, ancillary and laboratory) are listed below for reference.    Significant Diagnostic Studies: Dg Chest 2 View  08/14/2014   CLINICAL DATA:  Follow-up pacemaker placement  EXAM: CHEST  2 VIEW  COMPARISON:  PA and lateral chest of August 10, 2014  FINDINGS: There has been interval placement of a dual electrode pacemaker. Radiographic positioning is good. There is no postprocedure pneumothorax or hemo thorax. The lungs are clear. The heart and pulmonary vascularity are normal. The bony thorax exhibits no acute abnormality.  IMPRESSION: There is no postprocedure complication following permanent pacemaker placement.   Electronically Signed   By: David  Martinique M.D.   On: 08/14/2014 08:38   Dg Chest 2 View  08/10/2014   CLINICAL DATA:  Falling garden.  Left elbow and wrist pain.  EXAM: CHEST  2 VIEW  COMPARISON:  11/15/2003  FINDINGS: The heart size and mediastinal contours are within normal limits. Both lungs are clear. The visualized skeletal structures are unremarkable.  IMPRESSION: No active cardiopulmonary disease.   Electronically Signed   By: Rolm Baptise M.D.   On: 08/10/2014 16:05   Dg Elbow Complete Left  08/10/2014   CLINICAL  DATA:  Fall and guard and today. Left elbow pain, wrist pain.  EXAM: LEFT ELBOW - COMPLETE 3+ VIEW  COMPARISON:  None.  FINDINGS: No acute bony abnormality. Specifically, no fracture, subluxation, or dislocation. Soft tissues are intact. No joint effusion.  IMPRESSION: No acute bony abnormality.   Electronically Signed   By: Rolm Baptise M.D.   On: 08/10/2014 16:04   Dg Wrist Complete Left  08/10/2014   CLINICAL DATA:  Fall today with wrist pain, initial encounter  EXAM: LEFT WRIST - COMPLETE 3+ VIEW  COMPARISON:  None.  FINDINGS: Degenerative changes are noted at the first [facility] joint. Mild deformity of the distal radius is noted related to prior healed fracture. No acute fracture is noted. No gross soft tissue  abnormality is seen.  IMPRESSION: Degenerative changes without acute abnormality.   Electronically Signed   By: Inez Catalina M.D.   On: 08/10/2014 16:08   Ct Head Wo Contrast  08/10/2014   CLINICAL DATA:  Fall.  EXAM: CT HEAD WITHOUT CONTRAST  CT CERVICAL SPINE WITHOUT CONTRAST  TECHNIQUE: Multidetector CT imaging of the head and cervical spine was performed following the standard protocol without intravenous contrast. Multiplanar CT image reconstructions of the cervical spine were also generated.  COMPARISON:  None.  FINDINGS: CT HEAD FINDINGS  Acute subarachnoid hemorrhage overlies the left frontal lobe, image 18/series 2. No additional areas of hemorrhage identified. There is prominence of the sulci and ventricles consistent with brain atrophy. The paranasal stress set there is partial opacification of the sphenoid sinus. The paranasal sinuses are clear. The mastoid air cells are clear. There is a depressed skull fracture along the posterior midline overlying the superior sagittal sinus, image 54/series 3. No additional fractures or subluxations identified.  CT CERVICAL SPINE FINDINGS  Normal alignment of the cervical spine. The vertebral body heights are well preserved. There is disc space narrowing  and ventral endplate spurring at X4-4 and C6-7. The facet joints are all aligned. No fracture or subluxation identified.  IMPRESSION: 1. Acute subarachnoid hemorrhage overlies the left frontal lobe. 2. Posterior calvarium depressed skull fracture which are overlies the superior sagittal sinus.   Electronically Signed   By: Kerby Moors M.D.   On: 08/10/2014 16:48   Ct Cervical Spine Wo Contrast  08/10/2014   CLINICAL DATA:  Fall.  EXAM: CT HEAD WITHOUT CONTRAST  CT CERVICAL SPINE WITHOUT CONTRAST  TECHNIQUE: Multidetector CT imaging of the head and cervical spine was performed following the standard protocol without intravenous contrast. Multiplanar CT image reconstructions of the cervical spine were also generated.  COMPARISON:  None.  FINDINGS: CT HEAD FINDINGS  Acute subarachnoid hemorrhage overlies the left frontal lobe, image 18/series 2. No additional areas of hemorrhage identified. There is prominence of the sulci and ventricles consistent with brain atrophy. The paranasal stress set there is partial opacification of the sphenoid sinus. The paranasal sinuses are clear. The mastoid air cells are clear. There is a depressed skull fracture along the posterior midline overlying the superior sagittal sinus, image 54/series 3. No additional fractures or subluxations identified.  CT CERVICAL SPINE FINDINGS  Normal alignment of the cervical spine. The vertebral body heights are well preserved. There is disc space narrowing and ventral endplate spurring at Y1-8 and C6-7. The facet joints are all aligned. No fracture or subluxation identified.  IMPRESSION: 1. Acute subarachnoid hemorrhage overlies the left frontal lobe. 2. Posterior calvarium depressed skull fracture which are overlies the superior sagittal sinus.   Electronically Signed   By: Kerby Moors M.D.   On: 08/10/2014 16:48    Microbiology: Recent Results (from the past 240 hour(s))  MRSA PCR Screening     Status: None   Collection Time: 08/10/14   8:50 PM  Result Value Ref Range Status   MRSA by PCR NEGATIVE NEGATIVE Final    Comment:        The GeneXpert MRSA Assay (FDA approved for NASAL specimens only), is one component of a comprehensive MRSA colonization surveillance program. It is not intended to diagnose MRSA infection nor to guide or monitor treatment for MRSA infections.      Labs: Basic Metabolic Panel:  Recent Labs Lab 08/10/14 1539 08/11/14 0232  NA 140 138  K 4.1 3.7  CL 107  110  CO2 23 21*  GLUCOSE 112* 143*  BUN 14 11  CREATININE 0.84 0.66  CALCIUM 10.8* 9.8   Liver Function Tests:  Recent Labs Lab 08/10/14 1539 08/11/14 0232  AST 30 25  ALT 22 19  ALKPHOS 69 57  BILITOT 0.7 0.9  PROT 7.2 6.3*  ALBUMIN 4.1 3.3*   No results for input(s): LIPASE, AMYLASE in the last 168 hours. No results for input(s): AMMONIA in the last 168 hours. CBC:  Recent Labs Lab 08/10/14 1539 08/11/14 0232 08/12/14 0839 08/14/14 0607  WBC 11.4* 14.6* 9.3 7.7  NEUTROABS 10.1* 12.8* 7.8*  --   HGB 14.9 13.5 13.7 13.7  HCT 44.5 39.9 40.7 39.5  MCV 92.5 92.6 92.3 91.0  PLT 173 170 151 156   Cardiac Enzymes: No results for input(s): CKTOTAL, CKMB, CKMBINDEX, TROPONINI in the last 168 hours. BNP: BNP (last 3 results) No results for input(s): BNP in the last 8760 hours.  ProBNP (last 3 results) No results for input(s): PROBNP in the last 8760 hours.  CBG:  Recent Labs Lab 08/12/14 1221 08/13/14 1113 08/13/14 1644 08/14/14 0002 08/14/14 0650  GLUCAP 101* 86 78 131* 102*       Signed:  Dakarri Kessinger, JAI-GURMUKH  [facility] 08/14/2014, 11:59 AM

## 2014-08-14 NOTE — Progress Notes (Signed)
Pt discharged home. Discharge instructions completed.  

## 2014-08-14 NOTE — Progress Notes (Signed)
SUBJECTIVE: The patient is doing well today.  At this time, she denies chest pain, shortness of breath, or any new concerns.  S/p PPM 08/13/14 for syncope and symptomatic bradycardia  CURRENT MEDICATIONS: . ketorolac  1 drop Right Eye QID  . levothyroxine  50 mcg Oral QAC breakfast  . metoprolol tartrate  25 mg Oral BID  . ofloxacin  1 drop Right Eye QID  . pantoprazole (PROTONIX) IV  40 mg Intravenous QHS  . prednisoLONE acetate  1 drop Right Eye QID  . senna-docusate  1 tablet Oral BID  . vancomycin  1,000 mg Intravenous Once      OBJECTIVE: Physical Exam: Filed Vitals:   08/13/14 1952 08/13/14 2213 08/14/14 0042 08/14/14 0516  BP: 148/66 130/60 146/71 153/107  Pulse: 58 60 60 64  Temp: 98 F (36.7 C)   98.4 F (36.9 C)  TempSrc: Oral   Oral  Resp: 18   20  Height:      Weight:    127 lb 6.4 oz (57.788 kg)  SpO2: 100%  98% 100%    Intake/Output Summary (Last 24 hours) at 08/14/14 0744 Last data filed at 08/14/14 0200  Gross per 24 hour  Intake    960 ml  Output    700 ml  Net    260 ml    Telemetry reveals atrial pacing with intrinsic ventricular conduction  GEN- The patient is elderly appearing, alert and oriented x 3 today.   Head- normocephalic, atraumatic Eyes-  Sclera clear, conjunctiva pink Ears- hearing intact Oropharynx- clear Neck- supple, no JVP Lymph- no cervical lymphadenopathy Lungs- Clear to ausculation bilaterally, normal work of breathing Heart- Regular rate and rhythm GI- soft, NT, ND, + BS Extremities- no clubbing, cyanosis, or edema Skin- no rash or lesion, left chest +ecchymosis/no hematoma Psych- euthymic mood, full affect Neuro- strength and sensation are intact  LABS: CBC:  Recent Labs  08/12/14 0839 08/14/14 0607  WBC 9.3 7.7  NEUTROABS 7.8*  --   HGB 13.7 13.7  HCT 40.7 39.5  MCV 92.3 91.0  PLT 151 156   Thyroid Function Tests:  Recent Labs  08/11/14 1130  TSH 0.349*    RADIOLOGY: Dg Chest 2 View  08/10/2014   CLINICAL DATA:  Falling garden.  Left elbow and wrist pain.  EXAM: CHEST  2 VIEW  COMPARISON:  11/15/2003  FINDINGS: The heart size and mediastinal contours are within normal limits. Both lungs are clear. The visualized skeletal structures are unremarkable.  IMPRESSION: No active cardiopulmonary disease.   Electronically Signed   By: Rolm Baptise M.D.   On: 08/10/2014 16:05    ASSESSMENT AND PLAN:  Principal Problem:   Syncope and collapse Active Problems:   Sinus bradycardia   Atrial fibrillation with RVR   SAH (subarachnoid hemorrhage)   S/P cataract surgery, right eye   Hypothyroidism   Lichen sclerosus   Tachycardia with sick sinus syndrome, CHad2Vasc2=3, HASBLED=1   Fracture of skull   1St degree AV block  1.  Syncope/SSS/symptomatic bradycardia S/p PPM implant 08/13/14 CXR pending this morning Device interrogated and functioning normally  2.  Paroxysmal atrial fibrillation Will monitor V rates through device and adjust rate control as needed CHADS2VASC is at least 3.  She will need [place] initiated after SAH completely healed  3.  [facility] Per neurosurgery/primary team  Routine wound care and follow up instructions entered in AVS.  Ok from an EP standpoint to DC home once CXR done.  Electrophysiology team  to see as needed while here. Please call with questions.   Chanetta Marshall, NP 08/14/2014 7:47 AM    I have seen, examined the patient, and reviewed the above assessment and plan.  Device interrogation is reviewed and normal.  Changes to above are made where necessary.    Co Sign: Thompson Grayer, MD 08/14/2014 11:40 AM

## 2014-08-14 NOTE — Discharge Instructions (Signed)
Supplemental Discharge Instructions for  Pacemaker/Defibrillator Patients  Activity No heavy lifting or vigorous activity with your left/right arm for 6 to 8 weeks.  Do not raise your left/right arm above your head for one week.  Gradually raise your affected arm as drawn below.           __    08/16/14                  08/17/14                      08/18/14                  08/19/14  NO DRIVING  WOUND CARE - Keep the wound area clean and dry.  Do not get this area wet for one week. No showers for one week; you may shower on 08/19/14    . - The tape/steri-strips on your wound will fall off; do not pull them off.  No bandage is needed on the site.  DO  NOT apply any creams, oils, or ointments to the wound area. - If you notice any drainage or discharge from the wound, any swelling or bruising at the site, or you develop a fever > 101? F after you are discharged home, call the office at once.  Special Instructions - You are still able to use cellular telephones; use the ear opposite the side where you have your pacemaker/defibrillator.  Avoid carrying your cellular phone near your device. - When traveling through airports, show security personnel your identification card to avoid being screened in the metal detectors.  Ask the security personnel to use the hand wand. - Avoid arc welding equipment, MRI testing (magnetic resonance imaging), TENS units (transcutaneous nerve stimulators).  Call the office for questions about other devices. - Avoid electrical appliances that are in poor condition or are not properly grounded. - Microwave ovens are safe to be near or to operate.    Atrial Fibrillation Atrial fibrillation is a type of irregular heart rhythm (arrhythmia). During atrial fibrillation, the upper chambers of the heart (atria) quiver continuously in a chaotic pattern. This causes an irregular and often rapid heart rate.  Atrial fibrillation is the result of the heart becoming overloaded  with disorganized signals that tell it to beat. These signals are normally released one at a time by a part of the right atrium called the sinoatrial node. They then travel from the atria to the lower chambers of the heart (ventricles), causing the atria and ventricles to contract and pump blood as they pass. In atrial fibrillation, parts of the atria outside of the sinoatrial node also release these signals. This results in two problems. First, the atria receive so many signals that they do not have time to fully contract. Second, the ventricles, which can only receive one signal at a time, beat irregularly and out of rhythm with the atria.  There are three types of atrial fibrillation:   Paroxysmal. Paroxysmal atrial fibrillation starts suddenly and stops on its own within a week.  Persistent. Persistent atrial fibrillation lasts for more than a week. It may stop on its own or with treatment.  Permanent. Permanent atrial fibrillation does not go away. Episodes of atrial fibrillation may lead to permanent atrial fibrillation. Atrial fibrillation can prevent your heart from pumping blood normally. It increases your risk of stroke and can lead to heart failure.  CAUSES   Heart conditions, including a heart  attack, heart failure, coronary artery disease, and heart valve conditions.   Inflammation of the sac that surrounds the heart (pericarditis).  Blockage of an artery in the lungs (pulmonary embolism).  Pneumonia or other infections.  Chronic lung disease.  Thyroid problems, especially if the thyroid is overactive (hyperthyroidism).  Caffeine, excessive alcohol use, and use of some illegal drugs.   Use of some medicines, including certain decongestants and diet pills.  Heart surgery.   Birth defects.  Sometimes, no cause can be found. When this happens, the atrial fibrillation is called lone atrial fibrillation. The risk of complications from atrial fibrillation increases if you  have lone atrial fibrillation and you are age 86 years or older. RISK FACTORS  Heart failure.  Coronary artery disease.  Diabetes mellitus.   High blood pressure (hypertension).   Obesity.   Other arrhythmias.   Increased age. SIGNS AND SYMPTOMS   A feeling that your heart is beating rapidly or irregularly.   A feeling of discomfort or pain in your chest.   Shortness of breath.   Sudden light-headedness or weakness.   Getting tired easily when exercising.   Urinating more often than normal (mainly when atrial fibrillation first begins).  In paroxysmal atrial fibrillation, symptoms may start and suddenly stop. DIAGNOSIS  Your health care provider may be able to detect atrial fibrillation when taking your pulse. Your health care provider may have you take a test called an ambulatory electrocardiogram (ECG). An ECG records your heartbeat patterns over a 24-hour period. You may also have other tests, such as:  Transthoracic echocardiogram (TTE). During echocardiography, sound waves are used to evaluate how blood flows through your heart.  Transesophageal echocardiogram (TEE).  Stress test. There is more than one type of stress test. If a stress test is needed, ask your health care provider about which type is best for you.  Chest X-ray exam.  Blood tests.  Computed tomography (CT). TREATMENT  Treatment may include:  Treating any underlying conditions. For example, if you have an overactive thyroid, treating the condition may correct atrial fibrillation.  Taking medicine. Medicines may be given to control a rapid heart rate or to prevent blood clots, heart failure, or a stroke.  Having a procedure to correct the rhythm of the heart:  Electrical cardioversion. During electrical cardioversion, a controlled, low-energy shock is delivered to the heart through your skin. If you have chest pain, very low blood pressure, or sudden heart failure, this procedure may  need to be done as an emergency.  Catheter ablation. During this procedure, heart tissues that send the signals that cause atrial fibrillation are destroyed.  Surgical ablation. During this surgery, thin lines of heart tissue that carry the abnormal signals are destroyed. This procedure can either be an open-heart surgery or a minimally invasive surgery. With the minimally invasive surgery, small cuts are made to access the heart instead of a large opening.  Pulmonary venous isolation. During this surgery, tissue around the veins that carry blood from the lungs (pulmonary veins) is destroyed. This tissue is thought to carry the abnormal signals. HOME CARE INSTRUCTIONS   Take medicines only as directed by your health care provider. Some medicines can make atrial fibrillation worse or recur.  If blood thinners were prescribed by your health care provider, take them exactly as directed. Too much blood-thinning medicine can cause bleeding. If you take too little, you will not have the needed protection against stroke and other problems.  Perform blood tests at home if  directed by your health care provider. Perform blood tests exactly as directed.  Quit smoking if you smoke.  Do not drink alcohol.  Do not drink caffeinated beverages such as coffee, soda, and some teas. You may drink decaffeinated coffee, soda, or tea.   Maintain a healthy weight.Do not use diet pills unless your health care provider approves. They may make heart problems worse.   Follow diet instructions as directed by your health care provider.  Exercise regularly as directed by your health care provider.  Keep all follow-up visits as directed by your health care provider. This is important. PREVENTION  The following substances can cause atrial fibrillation to recur:   Caffeinated beverages.  Alcohol.  Certain medicines, especially those used for breathing problems.  Certain herbs and herbal medicines, such as  those containing ephedra or ginseng.  Illegal drugs, such as cocaine and amphetamines. Sometimes medicines are given to prevent atrial fibrillation from recurring. Proper treatment of any underlying condition is also important in helping prevent recurrence.  SEEK MEDICAL CARE IF:  You notice a change in the rate, rhythm, or strength of your heartbeat.  You suddenly begin urinating more frequently.  You tire more easily when exerting yourself or exercising. SEEK IMMEDIATE MEDICAL CARE IF:   You have chest pain, abdominal pain, sweating, or weakness.  You feel nauseous.  You have shortness of breath.  You suddenly have swollen feet and ankles.  You feel dizzy.  Your face or limbs feel numb or weak.  You have a change in your vision or speech. MAKE SURE YOU:   Understand these instructions.  Will watch your condition.  Will get help right away if you are not doing well or get worse. Document Released: 02/02/2005 Document Revised: 06/19/2013 Document Reviewed: 03/15/2012 [facility] Patient Information 2015 [place], [place]. This information is not intended to replace advice given to you by your health care provider. Make sure you discuss any questions you have with your health care provider.

## 2014-08-14 NOTE — Progress Notes (Signed)
UR completed.    Ceejay Kegley W. Keilana Morlock, RN, BSN  Trauma/Neuro ICU Case Manager [phone] 

## 2014-08-14 NOTE — Progress Notes (Signed)
Physical Therapy Treatment Patient Details Name: Kristine Holden MRN: [id number] DOB: 1928-01-29 Today's Date: 08/14/2014    History of Present Illness Kristine Holden is a 81 y.o. female with a history of 1st degree AV block, sinus bradycardia, carotid artery disease, hypothyroidism, mild dementia and OA presented to [facility] ED with Fall 08/10/14 and found to have Afib with RVR and SAH of frontal lobe and depressed skull fracture.    PT Comments    Pt ready to go home.  Sister present for education.  Discussed safety at home.  Discussed walking and HEP for pt to do.  Pt to have SBA with all mobilty initially.  Pt has cane at home.  All education complete.  Follow Up Recommendations        Equipment Recommendations  None recommended by PT    Recommendations for Other Services       Precautions / Restrictions Precautions Precautions: Fall Restrictions Weight Bearing Restrictions: No    Mobility  Bed Mobility                  Transfers Overall transfer level: Modified independent   Transfers: Sit to/from [employer] Transfers              Ambulation/Gait Ambulation/Gait assistance: Min guard Ambulation Distance (Feet): 120 Feet Assistive device: None Gait Pattern/deviations: Step-through pattern     General Gait Details: pt cued to walk slower and not talk while walking.  pt with occasional side stepping - but abel to recover.  pt knows to use cane at home.  talked about balance exercies for pt to do at home.  sister present for education   Stairs            Wheelchair Mobility    Modified Rankin (Stroke Patients Only)       Balance Overall balance assessment: Needs assistance                                  Cognition Arousal/Alertness: Awake/alert Behavior During Therapy: WFL for tasks assessed/performed (easily distracted)                        Exercises Other Exercises Other Exercises: reviewed balance  exercises taht pt can to at home by the sink for support - heel raises, standing in staggered stance, adding head turns, etc.    General Comments        Pertinent Vitals/Pain Pain Assessment: No/denies pain    Home Living                      Prior Function            PT Goals (current goals can now be found in the care plan section) Progress towards PT goals: Progressing toward goals    Frequency       PT Plan Current plan remains appropriate    Co-evaluation             End of Session   Activity Tolerance: Patient tolerated treatment well Patient left: in chair;with family/visitor present     Time: 1215-1230 PT Time Calculation (min) (ACUTE ONLY): 15 min  Charges:  $Gait Training: 8-22 mins                    G Codes:      Loyal Buba 08/14/2014, 12:51 PM 08/14/2014  Rande Lawman, PT

## 2014-08-27 ENCOUNTER — Ambulatory Visit: Payer: Medicare Other

## 2014-08-28 ENCOUNTER — Encounter: Payer: Self-pay | Admitting: Internal Medicine

## 2014-09-06 ENCOUNTER — Ambulatory Visit (INDEPENDENT_AMBULATORY_CARE_PROVIDER_SITE_OTHER): Payer: Medicare Other | Admitting: *Deleted

## 2014-09-06 ENCOUNTER — Telehealth: Payer: Self-pay | Admitting: Internal Medicine

## 2014-09-06 DIAGNOSIS — R001 Bradycardia, unspecified: Secondary | ICD-10-CM

## 2014-09-06 DIAGNOSIS — I4891 Unspecified atrial fibrillation: Secondary | ICD-10-CM | POA: Diagnosis not present

## 2014-09-06 DIAGNOSIS — I440 Atrioventricular block, first degree: Secondary | ICD-10-CM

## 2014-09-06 NOTE — Telephone Encounter (Signed)
New Message       Pt's nephew calling wanting to find out what happened at pt's appt today and find out what they need to do for pt. Please call back and advise.

## 2014-09-06 NOTE — Telephone Encounter (Signed)
LMTCB//sss 

## 2014-09-06 NOTE — Telephone Encounter (Signed)
Spoke to nephew about patient's appointment. I explained to the nephew how he would use the [facility] monitor. I explained the patient's restrictions to nephew. Nephew voiced understanding of all information given. I encouraged him to call back if he has any further questions. Nephew voiced understanding.

## 2014-09-06 NOTE — Progress Notes (Signed)
Wound check appointment. Stitch removed from R corner of incision. Wound without redness or edema. Incision edges approximated, wound well healed. Normal device function. Thresholds, sensing, and impedances consistent with implant measurements. Device programmed with auto capture programmed on. Histogram distribution appropriate for patient and level of activity. (1) mode switch episode x 4 sec @ 154/78---Competative. No high ventricular rates noted. Patient educated about wound care, arm mobility, lifting restrictions. ROV on 9/30 @ 1515 w/SK.

## 2014-09-06 NOTE — Telephone Encounter (Signed)
Follow Up  Pt nephew returning Kristine Holden's phone call- pt in custody of sister and are leaving for town soon today for the next few days. Please call back and discuss.

## 2014-09-07 ENCOUNTER — Telehealth: Payer: Self-pay | Admitting: Internal Medicine

## 2014-09-07 LAB — CUP PACEART INCLINIC DEVICE CHECK
Brady Statistic RA Percent Paced: 83 %
Brady Statistic RV Percent Paced: 47 %
Date Time Interrogation Session: 20160721132032
Lead Channel Impedance Value: 475 Ohm
Lead Channel Pacing Threshold Amplitude: 0.625 V
Lead Channel Pacing Threshold Pulse Width: 0.4 ms
Lead Channel Sensing Intrinsic Amplitude: 12 mV
Lead Channel Sensing Intrinsic Amplitude: 5 mV
Lead Channel Setting Pacing Amplitude: 1.625
Lead Channel Setting Pacing Pulse Width: 0.4 ms
Lead Channel Setting Sensing Sensitivity: 2 mV
MDC IDC MSMT BATTERY REMAINING LONGEVITY: 127.2 mo
MDC IDC MSMT BATTERY VOLTAGE: 3.11 V
MDC IDC MSMT LEADCHNL RA PACING THRESHOLD AMPLITUDE: 0.625 V
MDC IDC MSMT LEADCHNL RV IMPEDANCE VALUE: 650 Ohm
MDC IDC MSMT LEADCHNL RV PACING THRESHOLD PULSEWIDTH: 0.4 ms
MDC IDC SET LEADCHNL RV PACING AMPLITUDE: 0.875
Pulse Gen Serial Number: 7785863

## 2014-09-07 NOTE — Telephone Encounter (Signed)
LM for pt informing her that I didn't see any documentation in the system regarding a phone call from this am. I encouraged pt to call back with any further questions.

## 2014-09-07 NOTE — Telephone Encounter (Signed)
Follow Up   Pt is returning call from early this morning. Please call.

## 2014-09-18 ENCOUNTER — Telehealth: Payer: Self-pay | Admitting: Internal Medicine

## 2014-09-18 NOTE — Telephone Encounter (Signed)
New message     Pt nephew needing help to set up pt machine Please call to discuss

## 2014-09-19 NOTE — Telephone Encounter (Signed)
LMOVM for pt son to return call 

## 2014-09-20 NOTE — Telephone Encounter (Signed)
Spoke w/ pt son and he stated that he resolved the issue w/ Merlin.

## 2014-09-21 ENCOUNTER — Encounter: Payer: Self-pay | Admitting: Internal Medicine

## 2014-11-08 ENCOUNTER — Encounter: Payer: Self-pay | Admitting: Internal Medicine

## 2014-11-16 ENCOUNTER — Ambulatory Visit (INDEPENDENT_AMBULATORY_CARE_PROVIDER_SITE_OTHER): Payer: Medicare Other | Admitting: Internal Medicine

## 2014-11-16 ENCOUNTER — Encounter: Payer: Self-pay | Admitting: Internal Medicine

## 2014-11-16 VITALS — BP 122/68 | HR 62 | Ht 66.0 in | Wt 136.4 lb

## 2014-11-16 DIAGNOSIS — R001 Bradycardia, unspecified: Secondary | ICD-10-CM

## 2014-11-16 DIAGNOSIS — Z45018 Encounter for adjustment and management of other part of cardiac pacemaker: Secondary | ICD-10-CM

## 2014-11-16 NOTE — Progress Notes (Signed)
Patient Care Team: Harlan Stains, MD as PCP - General (Family Medicine)   HPI  Kristine Holden is a 81 y.o. female Seen in follow-up for pacemaker implanted 6/16 for symptomatic bradycardia  She also has a history of atrial fibrillation she ended up with a subarachnoid hemorrhage following her syncopal episode associated with the above.  Records and Results Reviewed* \ Echocardiogram 6/16 demonstrated normal LV function and mild biatrial enlargement    Past Medical History  Diagnosis Date  . Hypothyroidism   . Arthritis   . Hypothyroidism     left thyroid  . OA (osteoarthritis)     hands  . Lichen sclerosus   . Vitamin D deficiency   . Osteoporosis   . Sick sinus syndrome   . First degree AV block   . Paroxysmal atrial fibrillation     Past Surgical History  Procedure Laterality Date  . Hand surgery  20years ago    open dislocation  . Abdominal hysterectomy  1974  . Appendectomy  1951  . Tonsillectomy  1942    x2  . Total knee arthroplasty  01/25/2012    Procedure: TOTAL KNEE ARTHROPLASTY;  Surgeon: Gearlean Alf, MD;  Location: [facility];  Service: Orthopedics;  Laterality: Right;  . Ep implantable device N/A 08/13/2014    Procedure: Pacemaker Implant;  Surgeon: Deboraha Sprang, MD;  Location: [facility];  Service: Cardiovascular;  Laterality: N/A;    Current Outpatient Prescriptions  Medication Sig Dispense Refill  . alendronate (FOSAMAX) 70 MG tablet Take 70 mg by mouth once a week.     . Biotin (BIOTIN 5000) 5 MG CAPS Take 1 capsule by mouth daily.    . calcium-vitamin D 250-100 MG-UNIT per tablet Take 1 tablet by mouth daily.     Marland Kitchen donepezil (ARICEPT) 10 MG tablet Take 10 mg by mouth daily.    . Fish Oil-Cholecalciferol (FISH OIL + D3 PO) Take 1 capsule by mouth daily.    Marland Kitchen ketorolac (ACULAR) 0.4 % SOLN Place 1 drop into the right eye 4 (four) times daily.    Marland Kitchen levothyroxine (SYNTHROID, LEVOTHROID) 75 MCG tablet Take 75 mcg by mouth daily before  breakfast.    . Magnesium 500 MG CAPS Take 1 capsule by mouth daily.    . Multiple Vitamins-Minerals (MEGA MULTI WOMEN PO) Take 1 tablet by mouth daily.     Marland Kitchen ofloxacin (OCUFLOX) 0.3 % ophthalmic solution Place 1 drop into the right eye 4 (four) times daily.    . prednisoLONE acetate (PRED FORTE) 1 % ophthalmic suspension Place 1 drop into the right eye 4 (four) times daily.     No current facility-administered medications for this visit.    Allergies  Allergen Reactions  . Augmentin [Amoxicillin-Pot Clavulanate] Nausea And Vomiting  . Quinine Derivatives   . Penicillins Rash      Review of Systems negative except from HPI and PMH  Physical Exam BP 122/68 mmHg  Pulse 62  Ht 5\' 6"  (1.676 m)  Wt 136 lb 6.4 oz (61.871 kg)  BMI 22.03 kg/m2 Well developed and well nourished in no acute distress HENT normal E scleral and icterus clear Neck Supple JVP flat; carotids brisk and full Clear to ausculation Device pocket well healed; without hematoma or erythema.  There is no tethering Regular rate and rhythm, no murmurs gallops or rub Soft with active bowel sounds No clubbing cyanosis  Edema Alert and oriented, grossly normal motor and sensory function  Skin Warm and Dry  ECG  NSR 62 25/09/39 TWI inferolateral   Assessment and  Plan  AFib  Sinus bradycardia  Pacemaker St Jude   [facility] taumatic  Syncope  The patient has had no intercurrent syncope.  I will be in contact with Dr. Marlou Porch to discuss with neurosurgery appropriate timing as relates to the resumption of anticoagulation; it would be my impression that it could be resumed at any time. She continues to have intercurrent atrial fibrillation with rapid rates. The overall burden however is relatively small focus this juncture would hold off using controlling medications as they might aggravate tendencies toward hypotension

## 2014-11-16 NOTE — Patient Instructions (Signed)
Medication Instructions:  Your physician recommends that you continue on your current medications as directed. Please refer to the Current Medication list given to you today.  Labwork: None ordered  Testing/Procedures: None ordered  Follow-Up: Remote monitoring is used to monitor your Pacemaker of ICD from home. This monitoring reduces the number of office visits required to check your device to one time per year. It allows [place] to keep an eye on the functioning of your device to ensure it is working properly. You are scheduled for a device check from home on 02/19/15. You may send your transmission at any time that day. If you have a wireless device, the transmission will be sent automatically. After your physician reviews your transmission, you will receive a postcard with your next transmission date.  Your physician wants you to follow-up in: 9 months with Dr. Caryl Comes. You will receive a reminder letter in the mail two months in advance. If you don't receive a letter, please call our office to schedule the follow-up appointment.   Any Other Special Instructions Will Be Listed Below (If Applicable). Thank you for choosing [facility]!!

## 2014-11-19 LAB — CUP PACEART INCLINIC DEVICE CHECK
Battery Voltage: 3.04 V
Brady Statistic RA Percent Paced: 86 %
Brady Statistic RV Percent Paced: 58 %
Lead Channel Impedance Value: 712.5 Ohm
Lead Channel Pacing Threshold Amplitude: 0.625 V
Lead Channel Pacing Threshold Pulse Width: 0.4 ms
Lead Channel Sensing Intrinsic Amplitude: 12 mV
Lead Channel Sensing Intrinsic Amplitude: 4.9 mV
MDC IDC MSMT BATTERY REMAINING LONGEVITY: 123.6 mo
MDC IDC MSMT LEADCHNL RA IMPEDANCE VALUE: 487.5 Ohm
MDC IDC MSMT LEADCHNL RV PACING THRESHOLD AMPLITUDE: 0.625 V
MDC IDC MSMT LEADCHNL RV PACING THRESHOLD PULSEWIDTH: 0.4 ms
MDC IDC SESS DTM: 20160930184910
MDC IDC SET LEADCHNL RA PACING AMPLITUDE: 1.625
MDC IDC SET LEADCHNL RV PACING AMPLITUDE: 0.875
MDC IDC SET LEADCHNL RV PACING PULSEWIDTH: 0.4 ms
MDC IDC SET LEADCHNL RV SENSING SENSITIVITY: 2 mV
Pulse Gen Model: 2240
Pulse Gen Serial Number: 7785863

## 2014-11-27 ENCOUNTER — Telehealth: Payer: Self-pay | Admitting: *Deleted

## 2014-11-27 NOTE — Telephone Encounter (Signed)
Pt has been scheduled to see Dr Marlou Porch 10/25.

## 2014-11-27 NOTE — Telephone Encounter (Signed)
-----   Message from Jerline Pain, MD sent at 11/18/2014  8:17 AM EDT ----- Regarding: Appt to discuss anticoagulation following SAH Need to discuss anticoagulation. Had sub arachnoid hem.  Pacer. Vida Roller last.   Candee Furbish, MD

## 2014-11-27 NOTE — Telephone Encounter (Signed)
Attempted to contact pt to schedule an appt with Dr Marlou Porch to discuss restarting Coumadin.  Left message for pt to call back to schedule.

## 2014-12-11 ENCOUNTER — Telehealth: Payer: Self-pay

## 2014-12-11 ENCOUNTER — Encounter: Payer: Self-pay | Admitting: Cardiology

## 2014-12-11 ENCOUNTER — Ambulatory Visit (INDEPENDENT_AMBULATORY_CARE_PROVIDER_SITE_OTHER): Payer: Medicare Other | Admitting: Cardiology

## 2014-12-11 VITALS — BP 116/62 | HR 84 | Ht 66.0 in | Wt 139.8 lb

## 2014-12-11 DIAGNOSIS — I609 Nontraumatic subarachnoid hemorrhage, unspecified: Secondary | ICD-10-CM

## 2014-12-11 DIAGNOSIS — I4891 Unspecified atrial fibrillation: Secondary | ICD-10-CM | POA: Diagnosis not present

## 2014-12-11 DIAGNOSIS — Z7901 Long term (current) use of anticoagulants: Secondary | ICD-10-CM

## 2014-12-11 DIAGNOSIS — R55 Syncope and collapse: Secondary | ICD-10-CM

## 2014-12-11 MED ORDER — APIXABAN 5 MG PO TABS: 5.0000 mg | ORAL_TABLET | Freq: Two times a day (BID) | ORAL | Status: DC

## 2014-12-11 NOTE — Progress Notes (Signed)
[facility]. [address] [place], [place]  [address] Phone: [phone] Fax:  [phone]  Date:  12/11/2014   ID:  Kristine, Holden August 18, 1927, MRN [id number]  PCP:  Vidal Schwalbe, MD   History of Present Illness: Kristine Holden is a 81 y.o. female with history paroxysmal atrial fibrillation with pacemaker placement in June 2016 after which resulted in subarachnoid hemorrhage., carotid artery disease showing minimal plaque in March 2012,  strong family history of CAD with her father and brothers here for follow-up. Had hip surgery previously.  Left knee osteoarthritis. Difficulty with her walking long distances.  She denies any chest pain, shortness of breath. No further syncope.  Dr. Olin Pia office note reviewed, recommended anticoagulation at this time. It is been greater than 3-4 months since subarachnoid hemorrhage occurring after fall.  She does feel occasional palpitations at night. They're fleeting.  Wt Readings from Last 3 Encounters:  12/11/14 139 lb 12.8 oz (63.413 kg)  11/16/14 136 lb 6.4 oz (61.871 kg)  08/14/14 127 lb 6.4 oz (57.788 kg)     Past Medical History  Diagnosis Date  . Hypothyroidism   . Arthritis   . Hypothyroidism     left thyroid  . OA (osteoarthritis)     hands  . Lichen sclerosus   . Vitamin D deficiency   . Osteoporosis   . Sick sinus syndrome ([place])   . First degree AV block   . Paroxysmal atrial fibrillation [facility])     Past Surgical History  Procedure Laterality Date  . Hand surgery  20years ago    open dislocation  . Abdominal hysterectomy  1974  . Appendectomy  1951  . Tonsillectomy  1942    x2  . Total knee arthroplasty  01/25/2012    Procedure: TOTAL KNEE ARTHROPLASTY;  Surgeon: Gearlean Alf, MD;  Location: [facility];  Service: Orthopedics;  Laterality: Right;  . Ep implantable device N/A 08/13/2014    Procedure: Pacemaker Implant;  Surgeon: Deboraha Sprang, MD;  Location: [facility];  Service:  Cardiovascular;  Laterality: N/A;    Current Outpatient Prescriptions  Medication Sig Dispense Refill  . alendronate (FOSAMAX) 70 MG tablet Take 70 mg by mouth once a week.     . Biotin (BIOTIN 5000) 5 MG CAPS Take 1 capsule by mouth daily.    . calcium-vitamin D 250-100 MG-UNIT per tablet Take 1 tablet by mouth daily.     Marland Kitchen donepezil (ARICEPT) 10 MG tablet Take 10 mg by mouth daily.    . Fish Oil-Cholecalciferol (FISH OIL + D3 PO) Take 1 capsule by mouth daily.    Marland Kitchen levothyroxine (SYNTHROID, LEVOTHROID) 75 MCG tablet Take 75 mcg by mouth daily before breakfast.    . Magnesium 500 MG CAPS Take 1 capsule by mouth daily.    . Multiple Vitamins-Minerals (MEGA MULTI WOMEN PO) Take 1 tablet by mouth daily.      No current facility-administered medications for this visit.    Allergies:    Allergies  Allergen Reactions  . Augmentin [Amoxicillin-Pot Clavulanate] Nausea And Vomiting  . Quinine Derivatives   . Penicillins Rash    Social History:  The patient  reports that she has never smoked. She has never used smokeless tobacco. She reports that she does not drink alcohol or use illicit drugs.   Family History  Problem Relation Age of Onset  . Heart attack Father   . Heart disease Father  ROS:  Please see the history of present illness.   No syncope, no shortness of breath, no chest pain. Positive Left knee pain.  All other systems reviewed and negative.   PHYSICAL EXAM: VS:  BP 116/62 mmHg  Pulse 84  Ht 5\' 6"  (1.676 m)  Wt 139 lb 12.8 oz (63.413 kg)  BMI 22.58 kg/m2  SpO2 96% Well nourished, well developed, in no acute distress HEENT: normal, [place]/AT, EOMI Neck: no JVD, normal carotid upstroke, no bruit, no left ear pain this morning. No rashes. Cardiac:  normal S1, S2; regular; no murmurRare ectopy Lungs:  clear to auscultation bilaterally, no wheezing, rhonchi or rales Abd: soft, nontender, no hepatomegaly, no bruits Ext: no edema, 2+ distal pulses Skin: warm and dry GU:  deferred Neuro: no focal abnormalities noted, AAO x 3  EKG:  01/04/14-marked sinus bradycardia with first-degree AV block, 49 eats per minute, PR interval 282 ms. No significant change from prior. Previous PR interval was 266 ms. Prior heart rate was 47 bpm.    ASSESSMENT AND PLAN:  1. Paroxysmal atrial fibrillation/pacemaker-overall doing well. Occasional palpitations. Reviewed Dr. Olin Pia note. We will go ahead and start anticoagulation with Eliquis 5 mg twice a day. Her body weight is greater than 60 kg, creatinine is less than 1.5. Her age is greater than 84. We discussed potential bleeding risks. She understands. Is been approximate 4 month since her subarachnoid hemorrhage. Based on literature review, I feel comfortable proceeding with anticoagulation at this time.  2. Left knee osteoarthritis-difficulty for her to walk long distances. handicap placard. 3. Chronic antegrade relation-starting Eliquis as above. 4. History of subarachnoid hemorrhage-this was not spontaneous. It occurred after a fall. 5. One-month follow-up.  Signed, Candee Furbish, MD [facility]  12/11/2014 11:26 AM

## 2014-12-11 NOTE — Patient Instructions (Signed)
Medication Instructions:  Please start Eliquis 5 mg one twice a day. Continue all other medications as listed.  Labwork: Please have CBC at next office visit with Dr Marlou Porch.  Follow-Up: Follow up in 1 month with Dr Marlou Porch.  If you need a refill on your cardiac medications before your next appointment, please call your pharmacy.  Thank you for choosing [facility]!!

## 2014-12-11 NOTE — Telephone Encounter (Signed)
Prior auth for Eliquis 5 mg sent to [facility]. 

## 2014-12-12 ENCOUNTER — Telehealth: Payer: Self-pay | Admitting: *Deleted

## 2014-12-12 NOTE — Telephone Encounter (Signed)
called and LVM for sister Kristine Holden Hearing to have Burke Keels call us so that we could discuss where she wants her eliquis to be filled.

## 2014-12-12 NOTE — Telephone Encounter (Signed)
pt called back and said she had her eliquis moved to [facility] closer to her home.

## 2015-01-15 ENCOUNTER — Ambulatory Visit (INDEPENDENT_AMBULATORY_CARE_PROVIDER_SITE_OTHER): Payer: Medicare Other | Admitting: Cardiology

## 2015-01-15 ENCOUNTER — Encounter: Payer: Self-pay | Admitting: Cardiology

## 2015-01-15 ENCOUNTER — Telehealth: Payer: Self-pay | Admitting: Cardiology

## 2015-01-15 VITALS — BP 104/56 | HR 68 | Ht 66.0 in | Wt 135.0 lb

## 2015-01-15 DIAGNOSIS — M25562 Pain in left knee: Secondary | ICD-10-CM | POA: Diagnosis not present

## 2015-01-15 DIAGNOSIS — I4891 Unspecified atrial fibrillation: Secondary | ICD-10-CM

## 2015-01-15 DIAGNOSIS — Z01810 Encounter for preprocedural cardiovascular examination: Secondary | ICD-10-CM

## 2015-01-15 DIAGNOSIS — I609 Nontraumatic subarachnoid hemorrhage, unspecified: Secondary | ICD-10-CM | POA: Diagnosis not present

## 2015-01-15 LAB — CBC
HEMATOCRIT: 38.8 % (ref 36.0–46.0)
HEMOGLOBIN: 13.5 g/dL (ref 12.0–15.0)
MCH: 30.9 pg (ref 26.0–34.0)
MCHC: 34.8 g/dL (ref 30.0–36.0)
MCV: 88.8 fL (ref 78.0–100.0)
MPV: 10.9 fL (ref 8.6–12.4)
Platelets: 201 10*3/uL (ref 150–400)
RBC: 4.37 MIL/uL (ref 3.87–5.11)
RDW: 13.7 % (ref 11.5–15.5)
WBC: 6.4 10*3/uL (ref 4.0–10.5)

## 2015-01-15 LAB — BASIC METABOLIC PANEL
BUN: 18 mg/dL (ref 7–25)
CHLORIDE: 106 mmol/L (ref 98–110)
CO2: 25 mmol/L (ref 20–31)
Calcium: 10.9 mg/dL — ABNORMAL HIGH (ref 8.6–10.4)
Creat: 0.64 mg/dL (ref 0.60–0.88)
Glucose, Bld: 113 mg/dL — ABNORMAL HIGH (ref 65–99)
Potassium: 4.1 mmol/L (ref 3.5–5.3)
SODIUM: 139 mmol/L (ref 135–146)

## 2015-01-15 NOTE — Progress Notes (Signed)
[facility]. [address] [place], [place]  [address] Phone: [phone] Fax:  [phone]  Date:  01/15/2015   ID:  Kristine Holden, Kristine Holden 07-Oct-1927, MRN [id number]  PCP:  Vidal Schwalbe, MD   History of Present Illness: Kristine Holden is a 81 y.o. female with history paroxysmal atrial fibrillation with pacemaker placement in June 2016 after which resulted in subarachnoid hemorrhage., carotid artery disease showing minimal plaque in March 2012,  strong family history of CAD with her father and brothers here for follow-up.   Left knee osteoarthritis. Difficulty with her walking long distances.  She denies any chest pain, shortness of breath. No further syncope.  Dr. Olin Pia office note reviewed, recommended anticoagulation at this time. It is been greater than 3-4 months since subarachnoid hemorrhage occurring after fall.  She does feel occasional palpitations at night. They're fleeting.  Wt Readings from Last 3 Encounters:  01/15/15 135 lb (61.236 kg)  12/11/14 139 lb 12.8 oz (63.413 kg)  11/16/14 136 lb 6.4 oz (61.871 kg)     Past Medical History  Diagnosis Date  . Hypothyroidism   . Arthritis   . Hypothyroidism     left thyroid  . OA (osteoarthritis)     hands  . Lichen sclerosus   . Vitamin D deficiency   . Osteoporosis   . Sick sinus syndrome ([place])   . First degree AV block   . Paroxysmal atrial fibrillation [facility])     Past Surgical History  Procedure Laterality Date  . Hand surgery  20years ago    open dislocation  . Abdominal hysterectomy  1974  . Appendectomy  1951  . Tonsillectomy  1942    x2  . Total knee arthroplasty  01/25/2012    Procedure: TOTAL KNEE ARTHROPLASTY;  Surgeon: Gearlean Alf, MD;  Location: [facility];  Service: Orthopedics;  Laterality: Right;  . Ep implantable device N/A 08/13/2014    Procedure: Pacemaker Implant;  Surgeon: Deboraha Sprang, MD;  Location: [facility];  Service: Cardiovascular;  Laterality: N/A;     Current Outpatient Prescriptions  Medication Sig Dispense Refill  . alendronate (FOSAMAX) 70 MG tablet Take 70 mg by mouth once a week.     Marland Kitchen apixaban (ELIQUIS) 5 MG TABS tablet Take 1 tablet (5 mg total) by mouth 2 (two) times daily. 60 tablet 6  . Biotin (BIOTIN 5000) 5 MG CAPS Take 1 capsule by mouth daily.    . calcium-vitamin D 250-100 MG-UNIT per tablet Take 1 tablet by mouth daily.     Marland Kitchen donepezil (ARICEPT) 10 MG tablet Take 10 mg by mouth daily.    . Fish Oil-Cholecalciferol (FISH OIL + D3 PO) Take 1 capsule by mouth daily.    Marland Kitchen levothyroxine (SYNTHROID, LEVOTHROID) 75 MCG tablet Take 75 mcg by mouth daily before breakfast.    . Magnesium 500 MG CAPS Take 1 capsule by mouth daily.    . Multiple Vitamins-Minerals (MEGA MULTI WOMEN PO) Take 1 tablet by mouth daily.      No current facility-administered medications for this visit.    Allergies:    Allergies  Allergen Reactions  . Augmentin [Amoxicillin-Pot Clavulanate] Nausea And Vomiting  . Quinine Derivatives   . Penicillins Rash    Social History:  The patient  reports that she has never smoked. She has never used smokeless tobacco. She reports that she does not drink alcohol or use illicit drugs.   Family History  Problem  Relation Age of Onset  . Heart attack Father   . Heart disease Father     ROS:  Please see the history of present illness.   No syncope, no shortness of breath, no chest pain. Positive Left knee pain.  All other systems reviewed and negative.   PHYSICAL EXAM: VS:  BP 104/56 mmHg  Pulse 68  Ht 5\' 6"  (1.676 m)  Wt 135 lb (61.236 kg)  BMI 21.80 kg/m2  SpO2 98% Well nourished, well developed, in no acute distress HEENT: normal, /AT, EOMI Neck: no JVD, normal carotid upstroke, no bruit, no left ear pain this morning. No rashes. Cardiac:  normal S1, S2; regular; no murmurRare ectopy Lungs:  clear to auscultation bilaterally, no wheezing, rhonchi or rales Abd: soft, nontender, no hepatomegaly,  no bruits Ext: no edema, 2+ distal pulses Skin: warm and dry GU: deferred Neuro: no focal abnormalities noted, AAO x 3  EKG:  01/04/14-marked sinus bradycardia with first-degree AV block, 49 eats per minute, PR interval 282 ms. No significant change from prior. Previous PR interval was 266 ms. Prior heart rate was 47 bpm.    ASSESSMENT AND PLAN:  1. Preoperative risk stratification prior to left knee surgery-given her limited ability secondary to knee arthritis of achieving greater than 4 METS of activity, I will set her up for a pharmacologic stress test, Lexiscan for further risk stratification prior to surgery. If low risk, she may proceed with surgery and she will hold her Eliquis for 2 days. 2. Left knee osteoarthritis. 3. Paroxysmal atrial fibrillation/pacemaker-overall doing well. Occasional palpitations. Reviewed Dr. Olin Pia note. Anticoagulation with Eliquis 5 mg twice a day. Her body weight is greater than 60 kg, creatinine is less than 1.5. Her age is greater than 50. We discussed potential bleeding risks. She understands.  Prior subarachnoid hemorrhage following a fall.  4. Left knee osteoarthritis-difficulty for her to walk long distances. handicap placard. 5. Chronic anticoagulation- Eliquis as above. We will check CBC and basic metabolic profile since starting. 6. History of subarachnoid hemorrhage-this was not spontaneous. It occurred after a fall. 7.  grieving- her sister recently died last week. She found her at home dead from a massive hemorrhage. She said that she did not sleep for 4 nights because every time she closed her eyes she would see her sister.  She has had some exhaustion. 8. 80-month follow-up.  Signed, Candee Furbish, MD [facility]  01/15/2015 1:47 PM

## 2015-01-15 NOTE — Telephone Encounter (Signed)
New Message    Pt nephew is calling because pt sister died and he wants to know what was discussed in the office visit today   He wants to help her follow Dr. Marlou Porch Care plan, she has off set dementia he knows she wont remember the instructions or to give him the discharge papter

## 2015-01-15 NOTE — Patient Instructions (Signed)
Your physician recommends that you continue on your current medications as directed. Please refer to the Current Medication list given to you today.  Your physician recommends that you return for lab work today (BMET, CBC)  Your physician has requested that you have a [place]. For further information please visit [email or web address]. Please follow instruction sheet, as given.

## 2015-01-15 NOTE — Telephone Encounter (Signed)
I spoke with the patient. Informed her that her nephew would like updated on today's visit. Patient does not have DPR on file to speak with this person, although he is listed as emergency contact.  I advised her that I would need her permission to speak with her nephew.  She did not give it. She states that she understands what was discussed at the visit with Dr. Marlou Porch and that he was having his own testing done today and that she is going to see him when she leaves here and will discuss with him.  Advised that if she would like [place] to speak to anyone regarding her protected health information she needs to sign a DPR form.  She declined at this time.

## 2015-01-16 ENCOUNTER — Telehealth: Payer: Self-pay | Admitting: Cardiology

## 2015-01-16 NOTE — Telephone Encounter (Signed)
New message      Pt want to cancel her nuclear stress test but first talk to Dr Marlou Porch.  Please call

## 2015-01-16 NOTE — Telephone Encounter (Signed)
Called patient.  She stated that since she is having a knee replacement coming up she wanted to "cancel this imaging test that is scheduled for Dec 7."  Explained to patient that the stress test is part of pre op clearance in order for her to have her knee surgery. She verbalizes understanding and states no further questions.   She is aware to come to [facility].

## 2015-01-21 ENCOUNTER — Telehealth (HOSPITAL_COMMUNITY): Payer: Self-pay | Admitting: *Deleted

## 2015-01-21 NOTE — Telephone Encounter (Signed)
Left message on voicemail in reference to upcoming appointment scheduled for 01/23/15. Phone number given for a call back so details instructions can be given. Bryah Ocheltree, Ranae [place]ms

## 2015-01-22 ENCOUNTER — Telehealth (HOSPITAL_COMMUNITY): Payer: Self-pay | Admitting: *Deleted

## 2015-01-22 NOTE — Telephone Encounter (Signed)
Left message on voicemail in reference to upcoming appointment scheduled for 01/23/15. Phone number given for a call back so details instructions can be given. Moesha Sarchet, Ranae [place]ms

## 2015-01-23 ENCOUNTER — Ambulatory Visit (HOSPITAL_COMMUNITY): Payer: Medicare Other | Attending: Cardiovascular Disease

## 2015-01-23 DIAGNOSIS — I779 Disorder of arteries and arterioles, unspecified: Secondary | ICD-10-CM | POA: Insufficient documentation

## 2015-01-23 DIAGNOSIS — I609 Nontraumatic subarachnoid hemorrhage, unspecified: Secondary | ICD-10-CM | POA: Diagnosis not present

## 2015-01-23 DIAGNOSIS — Z01810 Encounter for preprocedural cardiovascular examination: Secondary | ICD-10-CM | POA: Insufficient documentation

## 2015-01-23 DIAGNOSIS — I4891 Unspecified atrial fibrillation: Secondary | ICD-10-CM | POA: Insufficient documentation

## 2015-01-23 DIAGNOSIS — R002 Palpitations: Secondary | ICD-10-CM | POA: Diagnosis not present

## 2015-01-23 LAB — MYOCARDIAL PERFUSION IMAGING
CHL CUP NUCLEAR SSS: 7
LVDIAVOL: 92 mL
LVSYSVOL: 28 mL
NUC STRESS TID: 0.98
Peak HR: 72 {beats}/min
RATE: 0.29
Rest HR: 60 {beats}/min
SDS: 3
SRS: 4

## 2015-01-23 MED ORDER — TECHNETIUM TC 99M SESTAMIBI GENERIC - CARDIOLITE
32.7000 | Freq: Once | INTRAVENOUS | Status: AC | PRN
  Administered 2015-01-23: 32.7 via INTRAVENOUS

## 2015-01-23 MED ORDER — REGADENOSON 0.4 MG/5ML IV SOLN
0.4000 mg | Freq: Once | INTRAVENOUS | Status: AC
Start: 2015-01-23 — End: 2015-01-23
  Administered 2015-01-23: 0.4 mg via INTRAVENOUS

## 2015-01-23 MED ORDER — TECHNETIUM TC 99M SESTAMIBI GENERIC - CARDIOLITE
11.0000 | Freq: Once | INTRAVENOUS | Status: AC | PRN
  Administered 2015-01-23: 11 via INTRAVENOUS

## 2015-01-28 NOTE — Progress Notes (Signed)
Results of myocardial perfusion imaging called to patient Informed her that she may proceed with surgery Patient states she is not having knee surgery until after the first of the year

## 2015-02-19 ENCOUNTER — Ambulatory Visit (INDEPENDENT_AMBULATORY_CARE_PROVIDER_SITE_OTHER): Payer: Medicare Other | Admitting: *Deleted

## 2015-02-19 DIAGNOSIS — I4891 Unspecified atrial fibrillation: Secondary | ICD-10-CM | POA: Diagnosis not present

## 2015-02-20 NOTE — Progress Notes (Signed)
Remote pacemaker transmission.   

## 2015-02-27 ENCOUNTER — Encounter: Payer: Self-pay | Admitting: Cardiology

## 2015-02-27 LAB — CUP PACEART REMOTE DEVICE CHECK
Battery Remaining Longevity: 125 mo
Battery Remaining Percentage: 95.5 %
Battery Voltage: 3.02 V
Brady Statistic AP VP Percent: 69 %
Brady Statistic AP VS Percent: 19 %
Brady Statistic AS VS Percent: 12 %
Brady Statistic RA Percent Paced: 87 %
Date Time Interrogation Session: 20170103070013
Implantable Lead Implant Date: 20160627
Implantable Lead Implant Date: 20160627
Implantable Lead Model: 1948
Lead Channel Impedance Value: 510 Ohm
Lead Channel Impedance Value: 700 Ohm
Lead Channel Pacing Threshold Amplitude: 0.5 V
Lead Channel Pacing Threshold Amplitude: 0.875 V
Lead Channel Pacing Threshold Pulse Width: 0.4 ms
Lead Channel Pacing Threshold Pulse Width: 0.4 ms
Lead Channel Sensing Intrinsic Amplitude: 5 mV
Lead Channel Setting Pacing Amplitude: 1.125
Lead Channel Setting Pacing Pulse Width: 0.4 ms
MDC IDC LEAD LOCATION: 753859
MDC IDC LEAD LOCATION: 753860
MDC IDC LEAD MODEL: 1944
MDC IDC MSMT LEADCHNL RV SENSING INTR AMPL: 12 mV
MDC IDC PG SERIAL: 7785863
MDC IDC SET LEADCHNL RA PACING AMPLITUDE: 1.5 V
MDC IDC SET LEADCHNL RV SENSING SENSITIVITY: 2 mV
MDC IDC STAT BRADY AS VP PERCENT: 1 %
MDC IDC STAT BRADY RV PERCENT PACED: 69 %

## 2015-03-15 ENCOUNTER — Encounter: Payer: Self-pay | Admitting: Internal Medicine

## 2015-03-25 ENCOUNTER — Ambulatory Visit: Payer: Self-pay | Admitting: Orthopedic Surgery

## 2015-04-26 ENCOUNTER — Inpatient Hospital Stay: Admit: 2015-04-26 | Payer: Medicare Other | Admitting: Orthopedic Surgery

## 2015-04-26 SURGERY — ARTHROPLASTY, KNEE, TOTAL
Anesthesia: Choice | Site: Knee | Laterality: Left

## 2015-05-21 ENCOUNTER — Ambulatory Visit (INDEPENDENT_AMBULATORY_CARE_PROVIDER_SITE_OTHER): Payer: Medicare Other | Admitting: *Deleted

## 2015-05-21 DIAGNOSIS — R001 Bradycardia, unspecified: Secondary | ICD-10-CM | POA: Diagnosis not present

## 2015-05-21 DIAGNOSIS — I4891 Unspecified atrial fibrillation: Secondary | ICD-10-CM | POA: Diagnosis not present

## 2015-05-21 NOTE — Progress Notes (Signed)
Remote pacemaker transmission.   

## 2015-07-09 LAB — CUP PACEART REMOTE DEVICE CHECK
Battery Voltage: 3.01 V
Brady Statistic AP VP Percent: 74 %
Brady Statistic AS VP Percent: 1 %
Brady Statistic AS VS Percent: 9.3 %
Brady Statistic RV Percent Paced: 75 %
Implantable Lead Implant Date: 20160627
Implantable Lead Implant Date: 20160627
Implantable Lead Location: 753859
Implantable Lead Model: 1944
Lead Channel Impedance Value: 730 Ohm
Lead Channel Pacing Threshold Amplitude: 0.5 V
Lead Channel Pacing Threshold Pulse Width: 0.4 ms
Lead Channel Sensing Intrinsic Amplitude: 12 mV
Lead Channel Setting Pacing Amplitude: 1 V
Lead Channel Setting Pacing Amplitude: 1.5 V
Lead Channel Setting Pacing Pulse Width: 0.4 ms
MDC IDC LEAD LOCATION: 753860
MDC IDC LEAD MODEL: 1948
MDC IDC MSMT BATTERY REMAINING LONGEVITY: 122 mo
MDC IDC MSMT BATTERY REMAINING PERCENTAGE: 95.5 %
MDC IDC MSMT LEADCHNL RA IMPEDANCE VALUE: 460 Ohm
MDC IDC MSMT LEADCHNL RA PACING THRESHOLD PULSEWIDTH: 0.4 ms
MDC IDC MSMT LEADCHNL RA SENSING INTR AMPL: 4.5 mV
MDC IDC MSMT LEADCHNL RV PACING THRESHOLD AMPLITUDE: 0.75 V
MDC IDC PG SERIAL: 7785863
MDC IDC SESS DTM: 20170404060015
MDC IDC SET LEADCHNL RV SENSING SENSITIVITY: 2 mV
MDC IDC STAT BRADY AP VS PERCENT: 16 %
MDC IDC STAT BRADY RA PERCENT PACED: 90 %

## 2015-07-10 ENCOUNTER — Encounter: Payer: Self-pay | Admitting: Cardiology

## 2015-07-17 ENCOUNTER — Encounter: Payer: Self-pay | Admitting: Cardiology

## 2015-07-17 ENCOUNTER — Ambulatory Visit (INDEPENDENT_AMBULATORY_CARE_PROVIDER_SITE_OTHER): Payer: Medicare Other | Admitting: Cardiology

## 2015-07-17 VITALS — BP 130/68 | HR 70 | Ht 66.0 in | Wt 136.8 lb

## 2015-07-17 DIAGNOSIS — Z950 Presence of cardiac pacemaker: Secondary | ICD-10-CM

## 2015-07-17 DIAGNOSIS — I480 Paroxysmal atrial fibrillation: Secondary | ICD-10-CM

## 2015-07-17 DIAGNOSIS — Z7901 Long term (current) use of anticoagulants: Secondary | ICD-10-CM

## 2015-07-17 NOTE — Patient Instructions (Signed)
Medication Instructions:  Same-no changes  Labwork: None  Testing/Procedures: None  Follow-Up: Your physician wants you to follow-up in: 6 months. You will receive a reminder letter in the mail two months in advance. If you don't receive a letter, please call our office to schedule the follow-up appointment.      If you need a refill on your cardiac medications before your next appointment, please call your pharmacy.   

## 2015-07-17 NOTE — Progress Notes (Signed)
[place]. [address] [place], [place]  [address] Phone: [phone] Fax:  [phone]  Date:  07/17/2015   ID:  Kristine, Holden [birth year], MRN [id number]  PCP:  Vidal Schwalbe, MD   History of Present Illness: Kristine Holden is a 83 y.o. female with history paroxysmal atrial fibrillation with pacemaker placement in June 2016, carotid artery disease showing minimal plaque in March 2012,  strong family history of CAD with her father and brothers here for follow-up.   Left knee osteoarthritis. Difficulty with her walking long distances. She is going to hold off of surgery.  She denies any chest pain, shortness of breath. No further syncope.  Dr. Olin Pia office note reviewed, recommended anticoagulation at this time. It is been greater than 3-4 months since subarachnoid hemorrhage occurring after fall.    Wt Readings from Last 3 Encounters:  07/17/15 136 lb 12.8 oz (62.052 kg)  01/23/15 135 lb (61.236 kg)  01/15/15 135 lb (61.236 kg)     Past Medical History  Diagnosis Date  . Hypothyroidism   . Arthritis   . Hypothyroidism     left thyroid  . OA (osteoarthritis)     hands  . Lichen sclerosus   . Vitamin D deficiency   . Osteoporosis   . Sick sinus syndrome ([place])   . First degree AV block   . Paroxysmal atrial fibrillation [facility])     Past Surgical History  Procedure Laterality Date  . Hand surgery  20years ago    open dislocation  . Abdominal hysterectomy  1974  . Appendectomy  1951  . Tonsillectomy  1942    x2  . Total knee arthroplasty  01/25/2012    Procedure: TOTAL KNEE ARTHROPLASTY;  Surgeon: Gearlean Alf, MD;  Location: [facility];  Service: Orthopedics;  Laterality: Right;  . Ep implantable device N/A 08/13/2014    Procedure: Pacemaker Implant;  Surgeon: Deboraha Sprang, MD;  Location: [place] [facility];  Service: Cardiovascular;  Laterality: N/A;    Current Outpatient Prescriptions  Medication Sig Dispense Refill  . apixaban (ELIQUIS) 5  MG TABS tablet Take 1 tablet (5 mg total) by mouth 2 (two) times daily. 60 tablet 6  . donepezil (ARICEPT) 10 MG tablet Take 10 mg by mouth daily.    Marland Kitchen levothyroxine (SYNTHROID, LEVOTHROID) 75 MCG tablet Take 75 mcg by mouth daily before breakfast.    . Magnesium 500 MG CAPS Take 1 capsule by mouth daily.    . Multiple Vitamins-Minerals (MEGA MULTI WOMEN PO) Take 1 tablet by mouth daily.      No current facility-administered medications for this visit.    Allergies:    Allergies  Allergen Reactions  . Augmentin [Amoxicillin-Pot Clavulanate] Nausea And Vomiting  . Quinine Derivatives   . Penicillins Rash    Social History:  The patient  reports that she has never smoked. She has never used smokeless tobacco. She reports that she does not drink alcohol or use illicit drugs.   Family History  Problem Relation Age of Onset  . Heart attack Father   . Heart disease Father     ROS:  Please see the history of present illness.   No syncope, no shortness of breath, no chest pain. Positive Left knee pain.  All other systems reviewed and negative.   PHYSICAL EXAM: VS:  BP 130/68 mmHg  Pulse 70  Ht 5\' 6"  (1.676 m)  Wt 136 lb 12.8 oz (62.052 kg)  BMI 22.09 kg/m2 Well nourished, well developed, in no acute distress HEENT: normal, [place]/AT, EOMI Neck: no JVD, normal carotid upstroke, no bruit,  No rashes. Cardiac:  normal S1, S2; regular; no murmurRare ectopy Lungs:  clear to auscultation bilaterally, no wheezing, rhonchi or rales Abd: soft, nontender, no hepatomegaly, no bruits Ext: no edema, 2+ distal pulses Skin: warm and dry GU: deferred Neuro: no focal abnormalities noted, AAO x 3  EKG:  01/04/14-marked sinus bradycardia with first-degree AV block, 49 eats per minute, PR interval 282 ms. No significant change from prior. Previous PR interval was 266 ms. Prior heart rate was 47 bpm.    01/23/15: Low risk nuclear stress test, no ischemia.  ASSESSMENT AND PLAN:  1. Left knee  osteoarthritis.- She decided to hold off her surgery. If she does need surgery, her nuclear stress test is overall low risk. 2. Paroxysmal atrial fibrillation/pacemaker-overall doing well. No palpitations. Reviewed Dr. Olin Pia note. Anticoagulation with Eliquis 5 mg twice a day. Her body weight is greater than 60 kg, creatinine is less than 1.5. Her age is greater than 54. We discussed potential bleeding risks. She understands.  Prior subarachnoid hemorrhage following a fall.  3. Left knee osteoarthritis-difficulty for her to walk long distances. handicap placard. Okay for knee replacement when she is ready. Low risk stress test. 4. Chronic anticoagulation- Eliquis as above. We will check CBC and basic metabolic profile since starting. 5. History of subarachnoid hemorrhage-this was not spontaneous. It occurred after a fall. 6. Grieving- her sister died. She found her at home dead from a massive hemorrhage. She said that she did not sleep for 4 nights because every time she closed her eyes she would see her sister.  She is doing better. 7. 63-month follow-up.  Signed, Candee Furbish, MD [facility]  07/17/2015 4:33 PM

## 2015-08-19 ENCOUNTER — Encounter (INDEPENDENT_AMBULATORY_CARE_PROVIDER_SITE_OTHER): Payer: Medicare Other | Admitting: Internal Medicine

## 2015-08-19 DIAGNOSIS — R55 Syncope and collapse: Secondary | ICD-10-CM | POA: Diagnosis not present

## 2015-08-19 DIAGNOSIS — Z950 Presence of cardiac pacemaker: Secondary | ICD-10-CM | POA: Diagnosis not present

## 2015-08-19 DIAGNOSIS — I480 Paroxysmal atrial fibrillation: Secondary | ICD-10-CM | POA: Diagnosis not present

## 2015-08-19 DIAGNOSIS — R0989 Other specified symptoms and signs involving the circulatory and respiratory systems: Secondary | ICD-10-CM

## 2015-08-19 DIAGNOSIS — R001 Bradycardia, unspecified: Secondary | ICD-10-CM

## 2015-08-21 ENCOUNTER — Encounter: Payer: Self-pay | Admitting: Internal Medicine

## 2015-09-04 ENCOUNTER — Encounter: Payer: Self-pay | Admitting: Internal Medicine

## 2015-09-04 ENCOUNTER — Encounter (INDEPENDENT_AMBULATORY_CARE_PROVIDER_SITE_OTHER): Payer: Self-pay

## 2015-09-04 ENCOUNTER — Ambulatory Visit (INDEPENDENT_AMBULATORY_CARE_PROVIDER_SITE_OTHER): Payer: Medicare Other | Admitting: Internal Medicine

## 2015-09-04 VITALS — BP 122/68 | HR 60 | Ht 66.0 in | Wt 139.4 lb

## 2015-09-04 DIAGNOSIS — Z950 Presence of cardiac pacemaker: Secondary | ICD-10-CM | POA: Diagnosis not present

## 2015-09-04 DIAGNOSIS — R001 Bradycardia, unspecified: Secondary | ICD-10-CM

## 2015-09-04 DIAGNOSIS — I480 Paroxysmal atrial fibrillation: Secondary | ICD-10-CM

## 2015-09-04 LAB — CUP PACEART INCLINIC DEVICE CHECK
Battery Remaining Longevity: 116.4
Battery Voltage: 3.01 V
Date Time Interrogation Session: 20170719134755
Implantable Lead Implant Date: 20160627
Implantable Lead Location: 753859
Implantable Lead Model: 1944
Lead Channel Impedance Value: 525 Ohm
Lead Channel Pacing Threshold Amplitude: 0.75 V
Lead Channel Pacing Threshold Pulse Width: 0.4 ms
Lead Channel Pacing Threshold Pulse Width: 0.4 ms
Lead Channel Setting Pacing Amplitude: 1 V
Lead Channel Setting Pacing Amplitude: 1.625
Lead Channel Setting Pacing Pulse Width: 0.4 ms
Lead Channel Setting Sensing Sensitivity: 2 mV
MDC IDC LEAD IMPLANT DT: 20160627
MDC IDC LEAD LOCATION: 753860
MDC IDC LEAD MODEL: 1948
MDC IDC MSMT LEADCHNL RA PACING THRESHOLD AMPLITUDE: 0.5 V
MDC IDC MSMT LEADCHNL RA SENSING INTR AMPL: 4.2 mV
MDC IDC MSMT LEADCHNL RV IMPEDANCE VALUE: 762.5 Ohm
MDC IDC MSMT LEADCHNL RV SENSING INTR AMPL: 12 mV
MDC IDC PG SERIAL: 7785863
MDC IDC STAT BRADY RA PERCENT PACED: 90 %
MDC IDC STAT BRADY RV PERCENT PACED: 75 %
Pulse Gen Model: 2240

## 2015-09-04 NOTE — Progress Notes (Signed)
Patient Care Team: Harlan Stains, MD as PCP - General (Family Medicine)   HPI  Kristine Holden is a 82 y.o. female Seen in follow-up for pacemaker implanted 6/16 for symptomatic bradycardia  She also has a history of atrial fibrillation she ended up with a subarachnoid hemorrhage following her syncopal episode associated with the above.  No bleeding issues  The patient denies chest pain, shortness of breath, nocturnal dyspnea, orthopnea or peripheral edema.  There have been no palpitations, lightheadedness or syncope.   Walking daily  Records and Results Reviewed* \ Echocardiogram 6/16 demonstrated normal LV function and mild biatrial enlargement    Past Medical History  Diagnosis Date  . Hypothyroidism   . Arthritis   . Hypothyroidism     left thyroid  . OA (osteoarthritis)     hands  . Lichen sclerosus   . Vitamin D deficiency   . Osteoporosis   . Sick sinus syndrome ([place])   . First degree AV block   . Paroxysmal atrial fibrillation [facility])     Past Surgical History  Procedure Laterality Date  . Hand surgery  20years ago    open dislocation  . Abdominal hysterectomy  1974  . Appendectomy  1951  . Tonsillectomy  1942    x2  . Total knee arthroplasty  01/25/2012    Procedure: TOTAL KNEE ARTHROPLASTY;  Surgeon: Gearlean Alf, MD;  [facility];  Service: Orthopedics;  Laterality: Right;  . Ep implantable device N/A 08/13/2014    Procedure: Pacemaker Implant;  Surgeon: Deboraha Sprang, MD;  Location: [place] [facility];  Service: Cardiovascular;  Laterality: N/A;    Current Outpatient Prescriptions  Medication Sig Dispense Refill  . apixaban (ELIQUIS) 5 MG TABS tablet Take 1 tablet (5 mg total) by mouth 2 (two) times daily. 60 tablet 6  . donepezil (ARICEPT) 10 MG tablet Take 10 mg by mouth daily.    Marland Kitchen levothyroxine (SYNTHROID, LEVOTHROID) 75 MCG tablet Take 75 mcg by mouth daily before breakfast.    . Magnesium 500 MG CAPS Take 1 capsule by mouth  daily.    . Multiple Vitamins-Minerals (MEGA MULTI WOMEN PO) Take 1 tablet by mouth daily.      No current facility-administered medications for this visit.    Allergies  Allergen Reactions  . Augmentin [Amoxicillin-Pot Clavulanate] Nausea And Vomiting  . Quinine Derivatives Other (See Comments)    Pt states "im just allergic" unknown reaction  . Penicillins Rash      Review of Systems negative except from HPI and PMH  Physical Exam BP 122/68 mmHg  Pulse 60  Ht 5\' 6"  (1.676 m)  Wt 139 lb 6.4 oz (63.231 kg)  BMI 22.51 kg/m2  SpO2 97% Well developed and well nourished in no acute distress HENT normal E scleral and icterus clear Neck Supple JVP flat; carotids brisk and full Clear to ausculation Device pocket well healed; without hematoma or erythema.  There is no tethering Regular rate and rhythm, no murmurs gallops or rub Soft with active bowel sounds No clubbing cyanosis  Edema Alert and oriented, grossly normal motor and sensory function Skin Warm and Dry  ECG  AV pacing at 62 32/18/45 TWI inferolateral    Sinus bradycardia  Pacemaker St Jude The patient's device was interrogated.  The information was reviewed. No changes were made in the programming.     SAH taumatic  Syncope     Paroxysmal atrial fibrillation detected on her device. She  is now on anticoagulation and tolerating it without difficulty.

## 2015-09-04 NOTE — Patient Instructions (Addendum)
Medication Instructions: Your physician recommends that you continue on your current medications as directed. Please refer to the Current Medication list given to you today.  Labwork: NONE  Procedures/Testing: NONE  Follow-Up: Your physician wants you to follow-up in: 1 year with Dr. Gari Crown will receive a reminder letter in the mail two months in advance. If you don't receive a letter, please call our office to schedule the follow-up appointment.  Remote monitoring is used to monitor your Pacemaker of ICD from home. This monitoring reduces the number of office visits required to check your device to one time per year. It allows [place] to keep an eye on the functioning of your device to ensure it is working properly. You are scheduled for a device check from home on 12/04/15. You may send your transmission at any time that day. If you have a wireless device, the transmission will be sent automatically. After your physician reviews your transmission, you will receive a postcard with your next transmission date.   Any Additional Special Instructions Will Be Listed Below (If Applicable).     If you need a refill on your cardiac medications before your next appointment, please call your pharmacy.

## 2015-12-04 ENCOUNTER — Encounter: Payer: Medicare Other | Admitting: *Deleted

## 2015-12-04 ENCOUNTER — Telehealth: Payer: Self-pay | Admitting: Cardiology

## 2015-12-04 NOTE — Telephone Encounter (Signed)
LMOVM reminding pt to send remote transmission.   

## 2015-12-06 ENCOUNTER — Encounter: Payer: Self-pay | Admitting: Cardiology

## 2015-12-06 NOTE — Progress Notes (Signed)
Letter  

## 2015-12-13 ENCOUNTER — Telehealth: Payer: Self-pay | Admitting: Internal Medicine

## 2015-12-13 NOTE — Telephone Encounter (Signed)
Pt calling regarding letter sent she missed appt 10-18 for remote, she does not know how to do this-pls call

## 2015-12-13 NOTE — Telephone Encounter (Signed)
Returned patient's call.  She no longer has a Merlin monitor, it was lost when she moved to her new address.  Advised patient I will order her a new monitor and a new cellular adapter, and I will mail her home monitor instructions.  Explained that she should expect to receive both pieces within the next few weeks.  Confirmed mailing address.  Patient is appreciative of information.  She is agreeable to calling the [facility] when she receives monitor for assistance with setting it up.  [employer] industry rep aware and will ship monitor and adapter.  Tentatively scheduled for remote transmission on 01/07/16, but will cancel if patient does not have necessary pieces by then.

## 2016-01-07 ENCOUNTER — Encounter: Payer: Medicare Other | Admitting: *Deleted

## 2016-01-07 ENCOUNTER — Telehealth: Payer: Self-pay | Admitting: Cardiology

## 2016-01-07 NOTE — Telephone Encounter (Signed)
Spoke with pt and reminded pt of remote transmission that is due today. Pt is not sure if she every received a new monitor. Another monitor was ordered. Pt is going to call the office once it is received.

## 2016-01-08 ENCOUNTER — Encounter: Payer: Self-pay | Admitting: Cardiology

## 2016-01-13 ENCOUNTER — Ambulatory Visit: Payer: Medicare Other | Admitting: Cardiology

## 2016-01-14 ENCOUNTER — Encounter: Payer: Self-pay | Admitting: Cardiology

## 2016-01-14 ENCOUNTER — Ambulatory Visit (INDEPENDENT_AMBULATORY_CARE_PROVIDER_SITE_OTHER): Payer: Medicare Other | Admitting: Cardiology

## 2016-01-14 VITALS — BP 118/80 | HR 50 | Ht 65.0 in | Wt 152.6 lb

## 2016-01-14 DIAGNOSIS — M25562 Pain in left knee: Secondary | ICD-10-CM

## 2016-01-14 DIAGNOSIS — Z7901 Long term (current) use of anticoagulants: Secondary | ICD-10-CM

## 2016-01-14 DIAGNOSIS — Z950 Presence of cardiac pacemaker: Secondary | ICD-10-CM | POA: Diagnosis not present

## 2016-01-14 DIAGNOSIS — I480 Paroxysmal atrial fibrillation: Secondary | ICD-10-CM

## 2016-01-14 NOTE — Patient Instructions (Signed)
Medication Instructions:  The current medical regimen is effective;  continue present plan and medications.  Follow-Up: Follow up in 6 months with Katy Thompson, PA.  You will receive a letter in the mail 2 months before you are due.  Please call us when you receive this letter to schedule your follow up appointment.  If you need a refill on your cardiac medications before your next appointment, please call your pharmacy.  Thank you for choosing [facility]!!     

## 2016-01-14 NOTE — Progress Notes (Signed)
[place]. [address], [address] [place], [place]  [address] Phone: [phone] Fax:  [phone]  Date:  01/14/2016   ID:  Kristine Holden, Kristine Holden Jul 04, 1927, MRN [id number]  PCP:  Vidal Schwalbe, MD   History of Present Illness: Kristine Holden is a 82 y.o. female with history paroxysmal atrial fibrillation with pacemaker placement in June 2016, carotid artery disease showing minimal plaque in March 2012,  strong family history of CAD with her father and brothers here for follow-up.   Left knee osteoarthritis. Difficulty with her walking long distances. She is going to hold off of surgery. This seems reasonable.  She denies any chest pain, shortness of breath. No further syncope.  Dr. Olin Pia office note reviewed, recommended anticoagulation at this time.  Her memory impairment has increased, [facility]. Weight has increased   Wt Readings from Last 3 Encounters:  01/14/16 152 lb 9.6 oz (69.2 kg)  09/04/15 139 lb 6.4 oz (63.2 kg)  07/17/15 136 lb 12.8 oz (62.1 kg)     Past Medical History:  Diagnosis Date  . Arthritis   . First degree AV block   . Hypothyroidism   . Hypothyroidism    left thyroid  . Lichen sclerosus   . OA (osteoarthritis)    hands  . Osteoporosis   . Paroxysmal atrial fibrillation ([facility])   . Sick sinus syndrome ([place])   . Vitamin D deficiency     Past Surgical History:  Procedure Laterality Date  . ABDOMINAL HYSTERECTOMY  1974  . APPENDECTOMY  1951  . EP IMPLANTABLE DEVICE N/A 08/13/2014   Procedure: Pacemaker Implant;  Surgeon: Deboraha Sprang, MD;  Location: [place] CV LAB;  Service: Cardiovascular;  Laterality: N/A;  . HAND SURGERY  20years ago   open dislocation  . TONSILLECTOMY  1942   x2  . TOTAL KNEE ARTHROPLASTY  01/25/2012   Procedure: TOTAL KNEE ARTHROPLASTY;  Surgeon: Gearlean Alf, MD;  [facility];  Service: Orthopedics;  Laterality: Right;    Current Outpatient Prescriptions  Medication Sig  Dispense Refill  . apixaban (ELIQUIS) 5 MG TABS tablet Take 1 tablet (5 mg total) by mouth 2 (two) times daily. 60 tablet 6  . donepezil (ARICEPT) 10 MG tablet Take 10 mg by mouth daily.    Marland Kitchen levothyroxine (SYNTHROID, LEVOTHROID) 75 MCG tablet Take 75 mcg by mouth daily before breakfast.    . Magnesium 500 MG CAPS Take 1 capsule by mouth daily.    . Multiple Vitamins-Minerals (MEGA MULTI WOMEN PO) Take 1 tablet by mouth daily.      No current facility-administered medications for this visit.     Allergies:    Allergies  Allergen Reactions  . Augmentin [Amoxicillin-Pot Clavulanate] Nausea And Vomiting  . Quinine Derivatives Other (See Comments)    Pt states "im just allergic" unknown reaction  . Penicillins Rash    Social History:  The patient  reports that she has never smoked. She has never used smokeless tobacco. She reports that she does not drink alcohol or use drugs.   Family History  Problem Relation Age of Onset  . Heart attack Father   . Heart disease Father     ROS:  Please see the history of present illness.   No syncope, no shortness of breath, no chest pain. Positive Left knee pain.  All other systems reviewed and negative.   PHYSICAL EXAM: VS:  BP 118/80   Pulse Marland Kitchen)  50   Ht 5\' 5"  (1.651 m)   Wt 152 lb 9.6 oz (69.2 kg)   LMP  (LMP Unknown)   BMI 25.39 kg/m  Well nourished, well developed, in no acute distress  HEENT: normal, [place]/AT, EOMI Neck: no JVD, normal carotid upstroke, no bruit,  No rashes. Cardiac:  normal S1, S2; regular; no murmur Rare ectopy Lungs:  clear to auscultation bilaterally, no wheezing, rhonchi or rales  Abd: soft, nontender, no hepatomegaly, no bruits  Ext: no edema, 2+ distal pulses Skin: warm and dry  GU: deferred Neuro: no focal abnormalities noted, AAO x 3  EKG:  01/04/14-marked sinus bradycardia with first-degree AV block, 49 eats per minute, PR interval 282 ms. No significant change from prior. Previous PR interval was 266 ms.  Prior heart rate was 47 bpm.    01/23/15: Low risk nuclear stress test, no ischemia.  ASSESSMENT AND PLAN:  1. Left knee osteoarthritis.- She decided to hold off her surgery. If she does need surgery, her nuclear stress test is overall low risk. She may proceed. 2. Paroxysmal atrial fibrillation/pacemaker-overall doing well. No palpitations. Reviewed Dr. Olin Pia note. Anticoagulation with Eliquis 5 mg twice a day. Her body weight is greater than 60 kg, creatinine is less than 1.5. Her age is greater than 40. We discussed potential bleeding risks. She understands.  Prior subarachnoid hemorrhage following a fall. She is showing no signs of bleeding. 3. Chronic anticoagulation- Eliquis as above. Continue to follow CBC, BMET 4. History of subarachnoid hemorrhage-this was not spontaneous. It occurred after a fall. 5. Memory impairment-currently on Aricept. [facility]. 6. 39-month follow-up with APP.  Signed, Candee Furbish, MD [facility]  01/14/2016 1:43 PM

## 2016-02-14 ENCOUNTER — Telehealth: Payer: Self-pay | Admitting: *Deleted

## 2016-02-14 NOTE — Telephone Encounter (Signed)
Spoke with [employer] rep.  New monitor and cellular adapter shipped ~10 days ago.  Attempted to reach patient to assist her with setting it up.  No answer, no VM.  Will continue trying.

## 2016-02-26 NOTE — Telephone Encounter (Signed)
Spoke with patient.  She is unsure if she received a Merlin monitor in December.  She began searching for monitor while on the phone but was unsuccessful.  Patient is agreeable to appointment on 03/11/16 at 4:00pm in the [facility] for a pacer check.  Requested that she continue looking for monitor.  Patient is agreeable to this plan and is appreciative of call.

## 2016-03-11 ENCOUNTER — Ambulatory Visit (INDEPENDENT_AMBULATORY_CARE_PROVIDER_SITE_OTHER): Payer: Medicare Other | Admitting: *Deleted

## 2016-03-11 DIAGNOSIS — Z950 Presence of cardiac pacemaker: Secondary | ICD-10-CM | POA: Diagnosis not present

## 2016-03-11 LAB — CUP PACEART INCLINIC DEVICE CHECK
Brady Statistic RA Percent Paced: 91 %
Brady Statistic RV Percent Paced: 79 %
Implantable Lead Location: 753860
Implantable Lead Model: 1944
Implantable Lead Model: 1948
Lead Channel Impedance Value: 525 Ohm
Lead Channel Pacing Threshold Amplitude: 0.5 V
Lead Channel Pacing Threshold Amplitude: 0.5 V
Lead Channel Pacing Threshold Amplitude: 0.75 V
Lead Channel Pacing Threshold Amplitude: 0.75 V
Lead Channel Pacing Threshold Pulse Width: 0.4 ms
Lead Channel Pacing Threshold Pulse Width: 0.4 ms
Lead Channel Sensing Intrinsic Amplitude: 12 mV
Lead Channel Sensing Intrinsic Amplitude: 4.7 mV
Lead Channel Setting Pacing Amplitude: 1.625
Lead Channel Setting Pacing Pulse Width: 0.4 ms
MDC IDC LEAD IMPLANT DT: 20160627
MDC IDC LEAD IMPLANT DT: 20160627
MDC IDC LEAD LOCATION: 753859
MDC IDC MSMT BATTERY VOLTAGE: 3.01 V
MDC IDC MSMT LEADCHNL RA PACING THRESHOLD PULSEWIDTH: 0.4 ms
MDC IDC MSMT LEADCHNL RV IMPEDANCE VALUE: 737.5 Ohm
MDC IDC MSMT LEADCHNL RV PACING THRESHOLD PULSEWIDTH: 0.4 ms
MDC IDC PG IMPLANT DT: 20160627
MDC IDC PG SERIAL: 7785863
MDC IDC SESS DTM: 20180124132522
MDC IDC SET LEADCHNL RV PACING AMPLITUDE: 1 V
MDC IDC SET LEADCHNL RV SENSING SENSITIVITY: 2 mV

## 2016-03-11 NOTE — Progress Notes (Signed)
Pacemaker check in clinic. Normal device function. Thresholds, sensing, impedances consistent with previous measurements. Device programmed to maximize longevity. <1% AT/AF- on Eliquis. 5 HVR episodes- EGMs show AF RVR, 1:1 SVT and 2 episodes of VT (lasting 9 mins and 26mins). Kristine Holden denies dizziness or syncope, but I am under the impression that she is a poor historian. I will review these episodes with Dr. Caryl Comes. Device programmed at appropriate safety margins. Histogram distribution appropriate for patient activity level. Device programmed to optimize intrinsic conduction. Estimated longevity 10.3-11.3 years. Kristine Holden has had her remote monitoring equipment replaced twice and cannot find either monitor. I have asked [facility] to look for the monitor mailed in December and to call us with any questions. ROV with SK in July.

## 2016-05-27 ENCOUNTER — Encounter: Payer: Self-pay | Admitting: Physician Assistant

## 2016-06-15 NOTE — Progress Notes (Signed)
Cardiology Office Note    Date:  06/16/2016   ID:  Anniemae, Haberkorn 1927/03/17, MRN [id number]  PCP:  Vidal Schwalbe, MD  Cardiologist:  Dr. Marlou Porch / Dr. Caryl Comes (EP)  CC: follow up.   History of Present Illness:  Kristine Holden is a 83 y.o. female with a history of PAF on Eliquis, symptomatic bradycardia s/p PPM, subarachnoid hemorrhage in the setting of syncope from bradycardia, dementia on Aricept who presents to clinic for follow up.   She was admitted to [facility] in 07/2014 after a syncopal episode resulting in a fall with a SAH. Telemetry revealed pauses over 2 seconds. She also had afib with RVR that admission. She was felt to have tachy-brady syndrome and symptomatic bradycardia and underwent St. Jude PPM. Later she was placed on Eliquis 5mg  BID for thromboembolic prophylaxis.   She was planning for knee surgery and myoview was ordered in 01/2016. This returned low risk and she was cleared for surgery; however, she eventually planned to hold off on surgery. She saw Dr. Marlou Porch in 12/2015 and was doing well.   Today she presents to clinic for follow up. No CP or SOB. No LE edema, orthopnea or PND. No dizziness or syncope. No blood in stool or urine. No palpitations. She lives a [place] and does some walking inside her facility for about 10 minutes at a time with no difficulties.     Past Medical History:  Diagnosis Date  . Arthritis   . First degree AV block   . Hypothyroidism   . Hypothyroidism    left thyroid  . Lichen sclerosus   . OA (osteoarthritis)    hands  . Osteoporosis   . Paroxysmal atrial fibrillation (HCC)   . Sick sinus syndrome ([place])   . Vitamin D deficiency     Past Surgical History:  Procedure Laterality Date  . ABDOMINAL HYSTERECTOMY  1974  . APPENDECTOMY  1951  . EP IMPLANTABLE DEVICE N/A 08/13/2014   Procedure: Pacemaker Implant;  Surgeon: Deboraha Sprang, MD;  Location: [facility];  Service: Cardiovascular;  Laterality: N/A;  . HAND  SURGERY  20years ago   open dislocation  . TONSILLECTOMY  1942   x2  . TOTAL KNEE ARTHROPLASTY  01/25/2012   Procedure: TOTAL KNEE ARTHROPLASTY;  Surgeon: Gearlean Alf, MD;  Location: [facility];  Service: Orthopedics;  Laterality: Right;    Current Medications: Outpatient Medications Prior to Visit  Medication Sig Dispense Refill  . alendronate (FOSAMAX) 70 MG tablet Take 70 mg by mouth once a week. Take with a full glass of water on an empty stomach.    Marland Kitchen apixaban (ELIQUIS) 5 MG TABS tablet Take 1 tablet (5 mg total) by mouth 2 (two) times daily. 60 tablet 6  . donepezil (ARICEPT) 10 MG tablet Take 10 mg by mouth daily.    Marland Kitchen levothyroxine (SYNTHROID, LEVOTHROID) 75 MCG tablet Take 75 mcg by mouth daily before breakfast.    . LUTEIN-ZEAXANTHIN PO Take 1 tablet by mouth daily.    . Magnesium 500 MG CAPS Take 1 capsule by mouth daily.    . Multiple Vitamins-Minerals (MEGA MULTI WOMEN PO) Take 1 tablet by mouth daily.     . Nutritional Supplements (ESTROVEN PO) Take 400 mcg by mouth daily.     No facility-administered medications prior to visit.      Allergies:   Augmentin [amoxicillin-pot clavulanate]; Quinine derivatives; and Penicillins   Social History   Social History  .  Marital status: Single    Spouse name: N/A  . Number of children: N/A  . Years of education: N/A   Social History Main Topics  . Smoking status: Never Smoker  . Smokeless tobacco: Never Used  . Alcohol use No  . Drug use: No  . Sexual activity: Not Asked   Other Topics Concern  . None   Social History Narrative  . None     Family History:  The patient's family history includes Heart attack in her father; Heart disease in her father.      ROS:   Please see the history of present illness.    ROS All other systems reviewed and are negative.   PHYSICAL EXAM:   VS:  BP 110/70   Pulse 64   Ht 5\' 5"  (1.651 m)   Wt 167 lb 1.9 oz (75.8 kg)   LMP  (LMP Unknown)   SpO2 98%   BMI 27.81 kg/m      GEN: Well nourished, well developed, in no acute distress  HEENT: normal  Neck: no JVD, carotid bruits, or masses Cardiac: RRR; no murmurs, rubs, or gallops,no edema  Respiratory:  clear to auscultation bilaterally, normal work of breathing GI: soft, nontender, nondistended, + BS MS: no deformity or atrophy  Skin: warm and dry, no rash Neuro:  Alert and Oriented x 3, Strength and sensation are intact Psych: euthymic mood, full affect    Wt Readings from Last 3 Encounters:  06/16/16 167 lb 1.9 oz (75.8 kg)  01/14/16 152 lb 9.6 oz (69.2 kg)  09/04/15 139 lb 6.4 oz (63.2 kg)      Studies/Labs Reviewed:   EKG:  EKG is ordered today.  The ekg ordered today demonstrates AV pacing HR 60  Recent Labs: No results found for requested labs within last 8760 hours.   Lipid Panel No results found for: CHOL, TRIG, HDL, CHOLHDL, VLDL, LDLCALC, LDLDIRECT  Additional studies/ records that were reviewed today include:  01/2016 Nuclear stress test Study Highlights   Nuclear stress EF: 69%.  This is a low risk study. No ischemia. Reasuring stress perfusion.  May proceed with surgery     2D ECHO: 08/11/2014 LV EF: 60% -   65% Study Conclusions - Left ventricle: The cavity size was normal. Wall thickness was   increased in a pattern of mild LVH. Systolic function was normal.   The estimated ejection fraction was in the range of 60% to 65%.   Wall motion was normal; there were no regional wall motion   abnormalities. Indeterminant diastolic function (atrial   fibrillation). - Aortic valve: There was no stenosis. - Mitral valve: There was trivial regurgitation. - Left atrium: The atrium was mildly dilated. - Right ventricle: The cavity size was normal. Systolic function   was normal. - Right atrium: The atrium was mildly dilated. - Tricuspid valve: Peak RV-RA gradient (S): 13 mm Hg. - Pulmonary arteries: PA peak pressure: 28 mm Hg (S). - Systemic veins: IVC measured 2.4 cm with <  50% respirophasic   variation, suggesting RA pressure 15 mmHg. - Pericardium, extracardiac: A trivial pericardial effusion was   identified. Impressions: - The patient was in atrial fibrillation. Normal LV size and   systolic function, EF 35-00%. Normal RV size and systolic   function. No significant valvular abnormalities. Dilated IVC.  Carotid artery dopplers 93/8182 RICA 99-37%, LICA 1-69%. Moderate disease. Repeat in one year carotid Doppler.  ASSESSMENT & PLAN:   PAF: AV paced today. <1% AT/AF  burden on last pacer check in 02/2016. Continue Eliquis 5mg  BID. CHADSVASC score at least 4 (age, 20 dz, female sex)  Symptomatic bradycardia s/p PPM: followed by Dr. Caryl Comes .   Dementia: continue Aricept  Carotid artery disease: RICA 16-38%, LICA 4-66%. Moderate disease. No bruits heard on exam.  Hypothyroidism: continue synthroid  Medication Adjustments/Labs and Tests Ordered: Current medicines are reviewed at length with the patient today.  Concerns regarding medicines are outlined above.  Medication changes, Labs and Tests ordered today are listed in the Patient Instructions below. Patient Instructions  Medication Instructions:  Your physician recommends that you continue on your current medications as directed. Please refer to the Current Medication list given to you today.   Labwork: None ordered  Testing/Procedures: None ordered  Follow-Up: Your physician wants you to follow-up in: [place] will receive a reminder letter in the mail two months in advance. If you don't receive a letter, please call our office to schedule the follow-up appointment.   Any Other Special Instructions Will Be Listed Below (If Applicable).     If you need a refill on your cardiac medications before your next appointment, please call your pharmacy.      Signed, Angelena Form, PA-C  06/16/2016 10:41 AM    [facility] [address],  [place], [place]  [address] Phone: [phone]; Fax: [phone]

## 2016-06-16 ENCOUNTER — Ambulatory Visit (INDEPENDENT_AMBULATORY_CARE_PROVIDER_SITE_OTHER): Payer: Medicare Other | Admitting: Physician Assistant

## 2016-06-16 ENCOUNTER — Encounter (INDEPENDENT_AMBULATORY_CARE_PROVIDER_SITE_OTHER): Payer: Self-pay

## 2016-06-16 ENCOUNTER — Encounter: Payer: Self-pay | Admitting: Physician Assistant

## 2016-06-16 VITALS — BP 110/70 | HR 64 | Ht 65.0 in | Wt 167.1 lb

## 2016-06-16 DIAGNOSIS — E039 Hypothyroidism, unspecified: Secondary | ICD-10-CM

## 2016-06-16 DIAGNOSIS — I739 Peripheral vascular disease, unspecified: Secondary | ICD-10-CM

## 2016-06-16 DIAGNOSIS — I779 Disorder of arteries and arterioles, unspecified: Secondary | ICD-10-CM

## 2016-06-16 DIAGNOSIS — I480 Paroxysmal atrial fibrillation: Secondary | ICD-10-CM | POA: Diagnosis not present

## 2016-06-16 DIAGNOSIS — Z950 Presence of cardiac pacemaker: Secondary | ICD-10-CM

## 2016-06-16 NOTE — Patient Instructions (Signed)

## 2016-07-09 ENCOUNTER — Telehealth: Payer: Self-pay | Admitting: Internal Medicine

## 2016-07-09 NOTE — Telephone Encounter (Signed)
Patient niece Lelon Frohlich) calling, states that she received letter to schedule a phys pacer check but states that patient had an appt 06-16-16 and would like to know if she needs to schedule with Dr. Caryl Comes or if her pacemaker was checked at her last appt on 06-16-16. Thanks.

## 2016-07-09 NOTE — Telephone Encounter (Signed)
Patient saw Bonney Leitz, [place] on 06/16/16- no device check noted that I can see- she will need to schedule follow up with Dr. Caryl Comes for device management.

## 2016-07-14 ENCOUNTER — Ambulatory Visit (INDEPENDENT_AMBULATORY_CARE_PROVIDER_SITE_OTHER): Payer: Medicare Other | Admitting: Internal Medicine

## 2016-07-14 ENCOUNTER — Encounter: Payer: Self-pay | Admitting: Internal Medicine

## 2016-07-14 VITALS — BP 116/64 | HR 70 | Ht 67.0 in | Wt 173.0 lb

## 2016-07-14 DIAGNOSIS — R001 Bradycardia, unspecified: Secondary | ICD-10-CM | POA: Diagnosis not present

## 2016-07-14 DIAGNOSIS — I480 Paroxysmal atrial fibrillation: Secondary | ICD-10-CM | POA: Diagnosis not present

## 2016-07-14 DIAGNOSIS — Z950 Presence of cardiac pacemaker: Secondary | ICD-10-CM

## 2016-07-14 LAB — CUP PACEART INCLINIC DEVICE CHECK
Battery Voltage: 3.01 V
Brady Statistic RA Percent Paced: 93 %
Brady Statistic RV Percent Paced: 85 %
Implantable Lead Implant Date: 20160627
Implantable Lead Location: 753859
Implantable Lead Model: 1948
Lead Channel Pacing Threshold Amplitude: 0.75 V
Lead Channel Pacing Threshold Pulse Width: 0.4 ms
Lead Channel Pacing Threshold Pulse Width: 0.4 ms
Lead Channel Sensing Intrinsic Amplitude: 5 mV
Lead Channel Setting Pacing Amplitude: 0.875
Lead Channel Setting Pacing Amplitude: 1.625
Lead Channel Setting Pacing Pulse Width: 0.4 ms
Lead Channel Setting Sensing Sensitivity: 2 mV
MDC IDC LEAD IMPLANT DT: 20160627
MDC IDC LEAD LOCATION: 753860
MDC IDC MSMT LEADCHNL RV PACING THRESHOLD AMPLITUDE: 0.75 V
MDC IDC MSMT LEADCHNL RV SENSING INTR AMPL: 12 mV
MDC IDC PG IMPLANT DT: 20160627
MDC IDC SESS DTM: 20180529163445
Pulse Gen Serial Number: 7785863

## 2016-07-14 NOTE — Progress Notes (Signed)
Patient Care Team: Harlan Stains, MD as PCP - General (Family Medicine)   HPI  Kristine Holden is a 83 y.o. female Seen in follow-up for pacemaker implanted 6/16 for symptomatic bradycardia  She also has a history of atrial fibrillation she ended up with a subarachnoid hemorrhage following her syncopal episode associated with the above.  On Anticoagulation;  No bleeding issues  The patient denies chest pain, shortness of breath, nocturnal dyspnea, orthopnea or peripheral edema.  There have been no palpitations, lightheadedness or syncope.   Walking daily  Records and Results Reviewed Echocardiogram 6/16 demonstrated normal LV function and mild biatrial enlargement  Date Cr Hgb  11/17 0.65 NA          Past Medical History:  Diagnosis Date  . Arthritis   . First degree AV block   . Hypothyroidism   . Hypothyroidism    left thyroid  . Lichen sclerosus   . OA (osteoarthritis)    hands  . Osteoporosis   . Paroxysmal atrial fibrillation (HCC)   . Sick sinus syndrome ([place])   . Vitamin D deficiency     Past Surgical History:  Procedure Laterality Date  . ABDOMINAL HYSTERECTOMY  1974  . APPENDECTOMY  1951  . EP IMPLANTABLE DEVICE N/A 08/13/2014   Procedure: Pacemaker Implant;  Surgeon: Deboraha Sprang, MD;  Location: [facility];  Service: Cardiovascular;  Laterality: N/A;  . HAND SURGERY  20years ago   open dislocation  . TONSILLECTOMY  1942   x2  . TOTAL KNEE ARTHROPLASTY  01/25/2012   Procedure: TOTAL KNEE ARTHROPLASTY;  Surgeon: Gearlean Alf, MD;  [facility]: [facility];  Service: Orthopedics;  Laterality: Right;    Current Outpatient Prescriptions  Medication Sig Dispense Refill  . acetaminophen (TYLENOL) 500 MG tablet Take 500 mg by mouth as needed (PAIN).    Marland Kitchen alendronate (FOSAMAX) 70 MG tablet Take 70 mg by mouth once a week. Take with a full glass of water on an empty stomach.    Marland Kitchen apixaban (ELIQUIS) 5 MG TABS tablet Take 1 tablet (5 mg total) by  mouth 2 (two) times daily. 60 tablet 6  . Apoaequorin (PREVAGEN) 10 MG CAPS Take 10 mg by mouth daily.    . cetirizine (ZYRTEC) 10 MG tablet Take 10 mg by mouth daily.    Marland Kitchen donepezil (ARICEPT) 10 MG tablet Take 10 mg by mouth daily.    Marland Kitchen levothyroxine (SYNTHROID, LEVOTHROID) 75 MCG tablet Take 75 mcg by mouth daily before breakfast.    . LUTEIN-ZEAXANTHIN PO Take 1 tablet by mouth daily.    . Magnesium 500 MG CAPS Take 1 capsule by mouth daily.    . Multiple Vitamins-Minerals (MEGA MULTI WOMEN PO) Take 1 tablet by mouth daily.     . Nutritional Supplements (ESTROVEN PO) Take 400 mcg by mouth daily.     No current facility-administered medications for this visit.     Allergies  Allergen Reactions  . Augmentin [Amoxicillin-Pot Clavulanate] Nausea And Vomiting  . Quinine Derivatives Other (See Comments)    Pt states "im just allergic" unknown reaction  . Penicillins Rash      Review of Systems negative except from HPI and PMH  Physical Exam BP 116/64   Pulse 70   Ht 5\' 7"  (1.702 m)   Wt 173 lb (78.5 kg)   LMP  (LMP Unknown)   SpO2 98%   BMI 27.10 kg/m  Well developed and nourished in no acute  distress HENT normal Neck supple with JVP-flat Clear Device pocket well healed; without hematoma or erythema.  There is no tethering  Regular rate and rhythm, no murmurs or gallops Abd-soft with active BS No Clubbing cyanosis edema Skin-warm and dry A & Oriented  Grossly normal sensory and motor function  ECG  A  pacing at 70 w intermittent RV pacing 32/08/ TWI inferolateral    Sinus bradycardia  Pacemaker St Jude The patient's device was interrogated.  The information was reviewed. No changes were made in the programming.    SAH taumatic  Syncope  On Anticoagulation;  No bleeding issues   No intercurrent atrial fibrillation or flutter  No syncope  We'll check CBC and creatinine 4 apixaban management

## 2016-07-14 NOTE — Patient Instructions (Addendum)
Medication Instructions: - Your physician recommends that you continue on your current medications as directed. Please refer to the Current Medication list given to you today.  Labwork: - Your physician recommends that you have lab work today: CBC/BMP  Procedures/Testing: - none ordered  Follow-Up: - Your physician wants you to follow-up in: 1 year with Dr. Caryl Comes. You will receive a reminder letter in the mail two months in advance. If you don't receive a letter, please call our office to schedule the follow-up appointment.   Any Additional Special Instructions Will Be Listed Below (If Applicable).  - call the [facility] at [phone] regarding setting up remote transmissions   If you need a refill on your cardiac medications before your next appointment, please call your pharmacy.

## 2016-07-15 LAB — CBC WITH DIFFERENTIAL/PLATELET
Basophils Absolute: 0 10*3/uL (ref 0.0–0.2)
Basos: 0 %
EOS (ABSOLUTE): 0 10*3/uL (ref 0.0–0.4)
Eos: 0 %
Hematocrit: 43.5 % (ref 34.0–46.6)
Hemoglobin: 14.2 g/dL (ref 11.1–15.9)
Immature Grans (Abs): 0 10*3/uL (ref 0.0–0.1)
Immature Granulocytes: 0 %
LYMPHS ABS: 0.8 10*3/uL (ref 0.7–3.1)
Lymphs: 8 %
MCH: 30.6 pg (ref 26.6–33.0)
MCHC: 32.6 g/dL (ref 31.5–35.7)
MCV: 94 fL (ref 79–97)
MONOS ABS: 0.2 10*3/uL (ref 0.1–0.9)
Monocytes: 2 %
Neutrophils Absolute: 9.2 10*3/uL — ABNORMAL HIGH (ref 1.4–7.0)
Neutrophils: 90 %
Platelets: 203 10*3/uL (ref 150–379)
RBC: 4.64 x10E6/uL (ref 3.77–5.28)
RDW: 14.6 % (ref 12.3–15.4)
WBC: 10.3 10*3/uL (ref 3.4–10.8)

## 2016-07-15 LAB — BASIC METABOLIC PANEL
BUN / CREAT RATIO: 35 — AB (ref 12–28)
BUN: 24 mg/dL (ref 8–27)
CHLORIDE: 101 mmol/L (ref 96–106)
CO2: 25 mmol/L (ref 18–29)
Calcium: 11.7 mg/dL — ABNORMAL HIGH (ref 8.7–10.3)
Creatinine, Ser: 0.69 mg/dL (ref 0.57–1.00)
GFR calc Af Amer: 91 mL/min/{1.73_m2} (ref >59)
GFR calc non Af Amer: 79 mL/min/{1.73_m2} (ref >59)
Glucose: 182 mg/dL — ABNORMAL HIGH (ref 65–99)
POTASSIUM: 5 mmol/L (ref 3.5–5.2)
Sodium: 137 mmol/L (ref 134–144)

## 2016-07-21 ENCOUNTER — Encounter: Payer: Self-pay | Admitting: Internal Medicine

## 2016-07-21 ENCOUNTER — Encounter: Payer: Self-pay | Admitting: *Deleted

## 2016-07-21 ENCOUNTER — Non-Acute Institutional Stay (SKILLED_NURSING_FACILITY): Payer: Medicare Other | Admitting: Internal Medicine

## 2016-07-21 DIAGNOSIS — F0280 Dementia in other diseases classified elsewhere without behavioral disturbance: Secondary | ICD-10-CM | POA: Insufficient documentation

## 2016-07-21 DIAGNOSIS — M810 Age-related osteoporosis without current pathological fracture: Secondary | ICD-10-CM

## 2016-07-21 DIAGNOSIS — J309 Allergic rhinitis, unspecified: Secondary | ICD-10-CM

## 2016-07-21 DIAGNOSIS — G301 Alzheimer's disease with late onset: Secondary | ICD-10-CM

## 2016-07-21 DIAGNOSIS — E039 Hypothyroidism, unspecified: Secondary | ICD-10-CM

## 2016-07-21 DIAGNOSIS — L900 Lichen sclerosus et atrophicus: Secondary | ICD-10-CM

## 2016-07-21 DIAGNOSIS — M1712 Unilateral primary osteoarthritis, left knee: Secondary | ICD-10-CM

## 2016-07-21 DIAGNOSIS — I480 Paroxysmal atrial fibrillation: Secondary | ICD-10-CM

## 2016-07-21 NOTE — Progress Notes (Signed)
LOCATION: [facility]  PCP: Blanchie Serve MD   Code Status: DNR  Goals of care: Advanced Directive information Advanced Directives 07/21/2016  Does Patient Have a Medical Advance Directive? Yes  Type of Advance Directive Out of facility DNR (pink MOST or yellow form)  Does patient want to make changes to medical advance directive? No - Patient declined  Copy of [place] in Chart? -  Would patient like information on creating a medical advance directive? -  Pre-existing out of facility DNR order (yellow form or pink MOST form) -       Extended Emergency Contact Information Primary Emergency Contact: [name],[name] Address: [phone] [address]          [place] [address] [name] of [place] Phone: [phone] Mobile Phone: [phone] Relation: None   Allergies  Allergen Reactions  . Augmentin [Amoxicillin-Pot Clavulanate] Nausea And Vomiting  . Quinine Derivatives Other (See Comments)    Pt states "im just allergic" unknown reaction  . Penicillins Rash    Chief Complaint  Patient presents with  . New Admit To SNF    New Admission Visit      HPI:  Patient is a 83 y.o. female seen today for long term care. She was residing in assisted living facility prior to this. She is seen in her room today. She has medical history of alzheimer's disease, afib, lichen sclerosus, OA, osteoporosis among others. She is ambulatory and does not use assistive device. She is continent with her bowel and bladder habits. She is compliant with her medications.   Review of Systems:  Constitutional: Negative for fever, chills, diaphoresis.  HENT: Negative for headache, congestion, nasal discharge, sore throat, difficulty swallowing.   Eyes: Negative for eye pain, blurred vision, double vision and discharge.  Wears glasses. Respiratory: Negative for cough, shortness of breath and wheezing.   Cardiovascular: Negative for chest pain,  palpitations, leg swelling.  Gastrointestinal: Negative for heartburn, nausea, vomiting, abdominal pain, loss of appetite, melena, diarrhea and constipation. Last bowel movement was this morning. Genitourinary: Negative for dysuria and flank pain.  Musculoskeletal: Negative for back pain, fall in the facility.  Skin: Negative for itching, rash.  Neurological: Negative for dizziness. Psychiatric/Behavioral: Negative for depression.   Past Medical History:  Diagnosis Date  . Arthritis   . First degree AV block   . Hypothyroidism   . Hypothyroidism    left thyroid  . Lichen sclerosus   . OA (osteoarthritis)    hands  . Osteoporosis   . Paroxysmal atrial fibrillation (HCC)   . Sick sinus syndrome ([place])   . Vitamin D deficiency    Past Surgical History:  Procedure Laterality Date  . ABDOMINAL HYSTERECTOMY  1974  . APPENDECTOMY  1951  . EP IMPLANTABLE DEVICE N/A 08/13/2014   Procedure: Pacemaker Implant;  Surgeon: Deboraha Sprang, MD;  Location: [facility];  Service: Cardiovascular;  Laterality: N/A;  . HAND SURGERY  20years ago   open dislocation  . TONSILLECTOMY  1942   x2  . TOTAL KNEE ARTHROPLASTY  01/25/2012   Procedure: TOTAL KNEE ARTHROPLASTY;  Surgeon: Gearlean Alf, MD;  [facility]: [facility];  Service: Orthopedics;  Laterality: Right;   Social History:   reports that she has never smoked. She has never used smokeless tobacco. She reports that she does not drink alcohol or use drugs.  Family History  Problem Relation Age of Onset  . Heart attack Father   .  Heart disease Father     Medications: Allergies as of 07/21/2016      Reactions   Augmentin [amoxicillin-pot Clavulanate] Nausea And Vomiting   Quinine Derivatives Other (See Comments)   Pt states "im just allergic" unknown reaction   Penicillins Rash      Medication List       Accurate as of 07/21/16 10:39 AM. Always use your most recent med list.          acetaminophen 500 MG tablet Commonly known  as:  TYLENOL Take 500 mg by mouth every 6 (six) hours as needed (PAIN).   alendronate 70 MG tablet Commonly known as:  FOSAMAX Take 70 mg by mouth once a week. Take with a full glass of water on an empty stomach.   apixaban 5 MG Tabs tablet Commonly known as:  ELIQUIS Take 1 tablet (5 mg total) by mouth 2 (two) times daily.   CALCIUM 1200+D3 PO Take 1 tablet by mouth daily.   cetirizine 10 MG tablet Commonly known as:  ZYRTEC Take 10 mg by mouth at bedtime.   ESTROVEN PO Take 400 mcg by mouth daily.   levothyroxine 75 MCG tablet Commonly known as:  SYNTHROID, LEVOTHROID Take 75 mcg by mouth daily before breakfast.   Magnesium 500 MG Caps Take 1 capsule by mouth daily.   mometasone 0.1 % cream Commonly known as:  ELOCON Apply 1 application topically 3 (three) times daily.   NAMZARIC 21-10 MG Cp24 Generic drug:  Memantine HCl-Donepezil HCl Take 1 tablet by mouth daily.   predniSONE 20 MG tablet Commonly known as:  DELTASONE Take 20 mg by mouth daily with breakfast. Stop date 07/26/16   triamcinolone cream 0.1 % Commonly known as:  KENALOG Apply 1 application topically 2 (two) times daily as needed. Apply to hands and legs       Immunizations: Immunization History  Administered Date(s) Administered  . Influenza-Unspecified 12/04/2015  . PPD Test 08/12/2015, 09/02/2015, 09/04/2015  . Tdap 08/10/2014     Physical Exam: Vitals:   07/21/16 1029  BP: 112/64  Pulse: 68  Resp: 18  Temp: 97.9 F (36.6 C)  TempSrc: Oral  SpO2: 97%  Weight: 173 lb (78.5 kg)  Height: 5\' 7"  (1.702 m)   Body mass index is 27.1 kg/m.  General- elderly female, well built, in no acute distress Head- normocephalic, atraumatic Nose- no maxillary or frontal sinus tenderness, no nasal discharge Throat- moist mucus membrane, normal oropharynx  Eyes- PERRLA, EOMI, no pallor, no icterus, no discharge, normal conjunctiva, normal sclera Neck- no cervical  lymphadenopathy Cardiovascular- normal s1,s2, no murmur Respiratory- bilateral clear to auscultation, no wheeze, no rhonchi, no crackles, no use of accessory muscles Abdomen- bowel sounds present, soft, non tender, no guarding or rigidity Musculoskeletal- able to move all 4 extremities, no spinal and paraspinal tenderness, arthritis changes to her fingers, normal range of motion Neurological- alert and oriented to self and place but not to time.  Skin- warm and dry, mild erythematous patches to her arms and legs with dry skin Psychiatry- normal mood and affect    Labs reviewed: Basic Metabolic Panel:  Recent Labs  07/14/16 1626  NA 137  K 5.0  CL 101  CO2 25  GLUCOSE 182*  BUN 24  CREATININE 0.69  CALCIUM 11.7*   CBC:  Recent Labs  07/14/16 1626  WBC 10.3  NEUTROABS 9.2*  HCT 43.5  MCV 94  PLT 203     Assessment/Plan   Alzheimer's disease With progression.  She is a high fall risk. Monitor for behavior changes. Supportive care for now. Continue namzaric.   Osteoporosis No known pathological fracture. Continue weekly osamax and daily calcium vitamin d supplement  afib Controlled heart rate. Followed by cardiology. Reviewed OV note. Continue eliquis.  Lichen sclerosis Continue mometasone cream. Continue her prednisone course until 07/26/16.  Hypothyroidism Lab Results  Component Value Date   TSH 0.349 (L) 08/11/2014   Check tsh. Continue levothyroxine for now  OA of left knee Continue tylenol 500 mg q6h prn pain and monitor  Allergic rhinitis On zyrtec, monitor    Goals of care: long term care   Labs/tests ordered: cbc, bmp, tsh  Family/ staff Communication: reviewed care plan with patient and nursing supervisor. Called her [place] with nursing supervisor present and have left voicemail to call back to review care plan further.     Blanchie Serve, MD Internal Medicine [facility] [address] [place],  [address] Cell Phone (Monday-Friday 8 am - 5 pm): [phone] On Call: [phone] and follow prompts after 5 pm and on weekends Office Phone: [phone] Office Fax: [phone]

## 2016-08-17 ENCOUNTER — Non-Acute Institutional Stay (SKILLED_NURSING_FACILITY): Payer: Medicare Other | Admitting: Nurse Practitioner

## 2016-08-17 ENCOUNTER — Encounter: Payer: Self-pay | Admitting: Nurse Practitioner

## 2016-08-17 DIAGNOSIS — L900 Lichen sclerosus et atrophicus: Secondary | ICD-10-CM | POA: Diagnosis not present

## 2016-08-17 DIAGNOSIS — G301 Alzheimer's disease with late onset: Secondary | ICD-10-CM | POA: Diagnosis not present

## 2016-08-17 DIAGNOSIS — E039 Hypothyroidism, unspecified: Secondary | ICD-10-CM

## 2016-08-17 DIAGNOSIS — F0280 Dementia in other diseases classified elsewhere without behavioral disturbance: Secondary | ICD-10-CM | POA: Diagnosis not present

## 2016-08-17 NOTE — Assessment & Plan Note (Signed)
Hx of dementia, taking Namenda, repetitive, difficulty with long and short term memory recalls.

## 2016-08-17 NOTE — Progress Notes (Signed)
Location:  [facility] Room Number: 110 Place of Service:  SNF [phone]) Provider:  Luisa Louk, Manxie  NP  Blanchie Serve, MD  Patient Care Team: Blanchie Serve, MD as PCP - General (Internal Medicine)  Extended Emergency Contact Information Primary Emergency Contact: [name],[name] Address: [address] [place] [address] [name] of [place] Phone: [phone] Mobile Phone: [phone] Relation: Other  Code Status:  DNR Goals of care: Advanced Directive information Advanced Directives 08/17/2016  Does Patient Have a Medical Advance Directive? Yes  Type of Advance Directive Out of facility DNR (pink MOST or yellow form)  Does patient want to make changes to medical advance directive? No - Patient declined  Copy of [place] in Chart? -  Would patient like information on creating a medical advance directive? -  Pre-existing out of facility DNR order (yellow form or pink MOST form) Yellow form placed in chart (order not valid for inpatient use)     Chief Complaint  Patient presents with  . Acute Visit    Hands peeling, ? reaction    HPI:  Pt is a 83 y.o. female seen today for an acute visit for peeling in palm of hands, denied itching or pain, denied new cosmetic or cleansing products. She has prn Triamcinolone cream available to her, Clobetasol cream bid, Mometasone tid to hands and legs. Also she takes Cetirizine 10mg  qhs for seasonal allergy symptoms. Hx of Lichen sclerosis.   Hx of dementia, taking Namenda, repetitive, difficulty with long and short term memory recalls. Hypothyroidism, taking Levothyroxine 62mcg daily since 07/23/16, TSH 7.3 07/23/16, pending TSH 4 weeks.   Past Medical History:  Diagnosis Date  . Arthritis   . First degree AV block   . Hypothyroidism   . Hypothyroidism    left thyroid  . Lichen sclerosus   . OA (osteoarthritis)    hands  . Osteoporosis   . Paroxysmal atrial fibrillation  (HCC)   . Sick sinus syndrome ([place])   . Vitamin D deficiency    Past Surgical History:  Procedure Laterality Date  . ABDOMINAL HYSTERECTOMY  1974  . APPENDECTOMY  1951  . EP IMPLANTABLE DEVICE N/A 08/13/2014   Procedure: Pacemaker Implant;  Surgeon: Deboraha Sprang, MD;  Location: [facility];  Service: Cardiovascular;  Laterality: N/A;  . HAND SURGERY  20years ago   open dislocation  . TONSILLECTOMY  1942   x2  . TOTAL KNEE ARTHROPLASTY  01/25/2012   Procedure: TOTAL KNEE ARTHROPLASTY;  Surgeon: Gearlean Alf, MD;  Location: [facility];  Service: Orthopedics;  Laterality: Right;    Allergies  Allergen Reactions  . Augmentin [Amoxicillin-Pot Clavulanate] Nausea And Vomiting  . Quinine Derivatives Other (See Comments)    Pt states "im just allergic" unknown reaction  . Penicillins Rash    Outpatient Encounter Prescriptions as of 08/17/2016  Medication Sig  . acetaminophen (TYLENOL) 500 MG tablet Take 500 mg by mouth every 6 (six) hours as needed (PAIN).   Marland Kitchen alendronate (FOSAMAX) 70 MG tablet Take 70 mg by mouth once a week. Take with a full glass of water on an empty stomach.  Marland Kitchen apixaban (ELIQUIS) 5 MG TABS tablet Take 1 tablet (5 mg total) by mouth 2 (two) times daily.  Marland Kitchen Apoaequorin (PREVAGEN) 10 MG CAPS Take 1 capsule by mouth daily.  . Calcium-Magnesium-Vitamin D (CALCIUM 1200+D3 PO) Take 1 tablet by mouth daily.  Marland Kitchen  cetirizine (ZYRTEC) 10 MG tablet Take 10 mg by mouth at bedtime.   . clobetasol cream (TEMOVATE) 3.76 % Apply 1 application topically 2 (two) times daily.  Marland Kitchen levothyroxine (SYNTHROID, LEVOTHROID) 75 MCG tablet Take 75 mcg by mouth daily before breakfast.  . Magnesium 500 MG CAPS Take 1 capsule by mouth daily.  . Memantine HCl-Donepezil HCl (NAMZARIC) 21-10 MG CP24 Take 1 tablet by mouth daily.  . mometasone (ELOCON) 0.1 % cream Apply 1 application topically 3 (three) times daily.  . Nutritional Supplements (ESTROVEN PO) Take 400 mcg by mouth daily.  .  polyethylene glycol (MIRALAX / GLYCOLAX) packet Take 17 g by mouth daily.  Marland Kitchen triamcinolone cream (KENALOG) 0.1 % Apply 1 application topically 2 (two) times daily as needed. Apply to hands and legs  . [DISCONTINUED] predniSONE (DELTASONE) 20 MG tablet Take 20 mg by mouth daily with breakfast. Stop date 07/26/16   No facility-administered encounter medications on file as of 08/17/2016.     Review of Systems  Constitutional: Negative.   HENT: Positive for hearing loss. Negative for congestion, postnasal drip, rhinorrhea, sinus pain and sneezing.   Eyes: Negative for pain, redness and itching.  Respiratory: Negative for cough and shortness of breath.   Cardiovascular: Negative for palpitations and leg swelling.  Gastrointestinal: Negative for constipation.  Genitourinary: Negative for difficulty urinating, dysuria and frequency.  Musculoskeletal: Positive for gait problem. Negative for arthralgias, back pain, joint swelling and myalgias.  Skin: Negative for color change, pallor, rash and wound.       Skin peeling hands  Allergic/Immunologic: Negative for environmental allergies and food allergies.  Neurological: Negative for dizziness, light-headedness, numbness and headaches.  Hematological: Does not bruise/bleed easily.  Psychiatric/Behavioral: Positive for confusion. Negative for agitation, behavioral problems and sleep disturbance. The patient is not nervous/anxious.     Immunization History  Administered Date(s) Administered  . Influenza-Unspecified 12/04/2015  . PPD Test 08/12/2015, 09/02/2015, 09/04/2015  . Tdap 08/10/2014   Pertinent  Health Maintenance Due  Topic Date Due  . DEXA SCAN  10/03/1994  . PNA vac Low Risk Adult (1 of 2 - PCV13) 10/03/1994  . INFLUENZA VACCINE  09/16/2016   No flowsheet data found. Functional Status Survey:    Vitals:   08/17/16 1234  BP: (!) 144/70  Pulse: 72  Resp: 16  Temp: 97.6 F (36.4 C)  Weight: 173 lb 1.6 oz (78.5 kg)  Height: 5'  7" (1.702 m)   Body mass index is 27.11 kg/m. Physical Exam  Constitutional: She appears well-developed and well-nourished.  HENT:  Head: Normocephalic and atraumatic.  Eyes: Conjunctivae and EOM are normal. Pupils are equal, round, and reactive to light.  Neck: Normal range of motion. Neck supple.  Cardiovascular: Normal rate.   No murmur heard. Pulmonary/Chest: Effort normal. She has no wheezes. She has no rales.  Abdominal: Soft. Bowel sounds are normal.  Musculoskeletal: Normal range of motion. She exhibits no edema or tenderness.  Neurological: She is alert. No cranial nerve deficit. Coordination normal.  Skin: Skin is warm and dry. No rash noted. No erythema. No pallor.  Skin peeling in palm of hands.   Psychiatric: She has a normal mood and affect. Her behavior is normal.    Labs reviewed:  Recent Labs  07/14/16 1626  NA 137  K 5.0  CL 101  CO2 25  GLUCOSE 182*  BUN 24  CREATININE 0.69  CALCIUM 11.7*   No results for input(s): AST, ALT, ALKPHOS, BILITOT, PROT, ALBUMIN in the last  8760 hours.  Recent Labs  07/14/16 1626  WBC 10.3  NEUTROABS 9.2*  HGB 14.2  HCT 43.5  MCV 94  PLT 203   Lab Results  Component Value Date   TSH 0.349 (L) 08/11/2014   No results found for: HGBA1C No results found for: CHOL, HDL, LDLCALC, LDLDIRECT, TRIG, CHOLHDL  Significant Diagnostic Results in last 30 days:  No results found.  Assessment/Plan Hypothyroidism 07/23/16 TSH 7.30, Na 138, K 4.3, Bun 21, creat 0.69, wbc 7.6, Hgb 13.7, plt163, increased Levothyroxine 74mcg, TSH 4 weeks.   Late onset Alzheimer's disease without behavioral disturbance Hx of dementia, taking Namenda, repetitive, difficulty with long and short term memory recalls.  Lichen sclerosus peeling in palm of hands, denied itching or pain, denied new cosmetic or cleansing products. She has prn Triamcinolone cream available to her, Clobetasol cream bid, Mometasone tid to hands and legs. Also she takes  Cetirizine 10mg  qhs for seasonal allergy symptoms. Hx of Lichen sclerosis.      Family/ staff Communication: SNF  Labs/tests ordered:  Pending TSH

## 2016-08-17 NOTE — Assessment & Plan Note (Signed)
07/23/16 TSH 7.30, Na 138, K 4.3, Bun 21, creat 0.69, wbc 7.6, Hgb 13.7, plt163, increased Levothyroxine 72mcg, TSH 4 weeks.

## 2016-08-17 NOTE — Assessment & Plan Note (Signed)
peeling in palm of hands, denied itching or pain, denied new cosmetic or cleansing products. She has prn Triamcinolone cream available to her, Clobetasol cream bid, Mometasone tid to hands and legs. Also she takes Cetirizine 10mg  qhs for seasonal allergy symptoms. Hx of Lichen sclerosis.

## 2016-08-20 LAB — TSH: TSH: 1.76 (ref <=5.90)

## 2016-08-24 ENCOUNTER — Other Ambulatory Visit: Payer: Self-pay | Admitting: *Deleted

## 2016-09-16 ENCOUNTER — Encounter: Payer: Self-pay | Admitting: Nurse Practitioner

## 2016-09-16 ENCOUNTER — Non-Acute Institutional Stay (SKILLED_NURSING_FACILITY): Payer: Medicare Other | Admitting: Nurse Practitioner

## 2016-09-16 DIAGNOSIS — F0280 Dementia in other diseases classified elsewhere without behavioral disturbance: Secondary | ICD-10-CM | POA: Diagnosis not present

## 2016-09-16 DIAGNOSIS — I480 Paroxysmal atrial fibrillation: Secondary | ICD-10-CM | POA: Diagnosis not present

## 2016-09-16 DIAGNOSIS — E039 Hypothyroidism, unspecified: Secondary | ICD-10-CM

## 2016-09-16 DIAGNOSIS — Z7901 Long term (current) use of anticoagulants: Secondary | ICD-10-CM

## 2016-09-16 DIAGNOSIS — G301 Alzheimer's disease with late onset: Secondary | ICD-10-CM | POA: Diagnosis not present

## 2016-09-16 DIAGNOSIS — K5900 Constipation, unspecified: Secondary | ICD-10-CM | POA: Diagnosis not present

## 2016-09-16 DIAGNOSIS — K5909 Other constipation: Secondary | ICD-10-CM | POA: Insufficient documentation

## 2016-09-16 NOTE — Progress Notes (Signed)
Location:  [facility] Room Number: 110 Place of Service:  SNF [phone]) Provider:  Azyiah Bo, Manxie  NP  Blanchie Serve, MD  Patient Care Team: Blanchie Serve, MD as PCP - General (Internal Medicine)  Extended Emergency Contact Information Primary Emergency Contact: [name],[name] Address: [address] [place] [address] [name] of [place] Phone: [phone] Mobile Phone: [phone] Relation: Other  Code Status:  DNR Goals of care: Advanced Directive information Advanced Directives 09/16/2016  Does Patient Have a Medical Advance Directive? Yes  Type of Advance Directive Out of facility DNR (pink MOST or yellow form)  Does patient want to make changes to medical advance directive? No - Patient declined  Copy of [place] in Chart? -  Would patient like information on creating a medical advance directive? -  Pre-existing out of facility DNR order (yellow form or pink MOST form) Yellow form placed in chart (order not valid for inpatient use)     Chief Complaint  Patient presents with  . Medical Management of Chronic Issues    HPI:  Pt is a 83 y.o. female seen today for medical management of chronic diseases.     Hx of dementia, taking Namenda, Aricept, repetitive, difficulty with long and short term memory recalls. Hypothyroidism, taking Levothyroxine 3mcg daily, last TSH 1.76 08/20/16. Hx of Lichen sclerosis. Constipation, takes MiraLax daily. Takes Eliquis for thromboembolic risk reduction for Afib.    Past Medical History:  Diagnosis Date  . Arthritis   . First degree AV block   . Hypothyroidism   . Hypothyroidism    left thyroid  . Lichen sclerosus   . OA (osteoarthritis)    hands  . Osteoporosis   . Paroxysmal atrial fibrillation (HCC)   . Sick sinus syndrome ([place])   . Vitamin D deficiency    Past Surgical History:  Procedure Laterality Date  . ABDOMINAL HYSTERECTOMY  1974  . APPENDECTOMY   1951  . EP IMPLANTABLE DEVICE N/A 08/13/2014   Procedure: Pacemaker Implant;  Surgeon: Deboraha Sprang, MD;  Location: [facility];  Service: Cardiovascular;  Laterality: N/A;  . HAND SURGERY  20years ago   open dislocation  . TONSILLECTOMY  1942   x2  . TOTAL KNEE ARTHROPLASTY  01/25/2012   Procedure: TOTAL KNEE ARTHROPLASTY;  Surgeon: Gearlean Alf, MD;  Location: [facility];  Service: Orthopedics;  Laterality: Right;    Allergies  Allergen Reactions  . Augmentin [Amoxicillin-Pot Clavulanate] Nausea And Vomiting  . Quinine Derivatives Other (See Comments)    Pt states "im just allergic" unknown reaction  . Penicillins Rash    Outpatient Encounter Prescriptions as of 09/16/2016  Medication Sig  . acetaminophen (TYLENOL) 500 MG tablet Take 500 mg by mouth every 6 (six) hours as needed (PAIN).   Marland Kitchen alendronate (FOSAMAX) 70 MG tablet Take 70 mg by mouth once a week. Take with a full glass of water on an empty stomach.  Marland Kitchen apixaban (ELIQUIS) 5 MG TABS tablet Take 1 tablet (5 mg total) by mouth 2 (two) times daily.  Marland Kitchen Apoaequorin (PREVAGEN) 10 MG CAPS Take 1 capsule by mouth daily.  . Calcium-Magnesium-Vitamin D (CALCIUM 1200+D3 PO) Take 1 tablet by mouth daily.  . cetirizine (ZYRTEC) 10 MG tablet Take 10 mg by mouth at bedtime.   . clobetasol cream (TEMOVATE) 6.31 % Apply 1 application topically 2 (two) times daily.  Marland Kitchen levothyroxine (SYNTHROID, LEVOTHROID) 75  MCG tablet Take 75 mcg by mouth daily before breakfast.  . Magnesium 500 MG CAPS Take 1 capsule by mouth daily.  . Memantine HCl-Donepezil HCl (NAMZARIC) 21-10 MG CP24 Take 1 tablet by mouth daily.  . mometasone (ELOCON) 0.1 % cream Apply 1 application topically 3 (three) times daily.  . Nutritional Supplements (ESTROVEN PO) Take 400 mcg by mouth daily.  Vladimir Faster Glycol-Propyl Glycol (SYSTANE) 0.4-0.3 % SOLN Apply 1 drop to eye 2 (two) times daily. For both eyes.  . polyethylene glycol (MIRALAX / GLYCOLAX) packet Take 17 g by  mouth daily.  Marland Kitchen triamcinolone cream (KENALOG) 0.1 % Apply 1 application topically 2 (two) times daily as needed. Apply to hands and legs   No facility-administered encounter medications on file as of 09/16/2016.     Review of Systems  Constitutional: Negative for activity change and appetite change.  HENT: Positive for hearing loss. Negative for congestion.   Eyes: Negative for visual disturbance.  Respiratory: Negative for cough and choking.   Cardiovascular: Negative for chest pain and leg swelling.  Gastrointestinal: Negative for constipation.  Genitourinary: Negative for dysuria and frequency.  Musculoskeletal: Positive for gait problem. Negative for arthralgias and back pain.  Skin: Negative for rash and wound.       Skin peeling hands  Neurological: Negative for dizziness and headaches.  Psychiatric/Behavioral: Positive for confusion. Negative for agitation and behavioral problems.    Immunization History  Administered Date(s) Administered  . Influenza-Unspecified 12/04/2015  . PPD Test 08/12/2015, 09/02/2015, 09/04/2015  . Tdap 08/10/2014   Pertinent  Health Maintenance Due  Topic Date Due  . DEXA SCAN  10/03/1994  . PNA vac Low Risk Adult (1 of 2 - PCV13) 10/03/1994  . INFLUENZA VACCINE  09/16/2016   No flowsheet data found. Functional Status Survey:    Vitals:   09/16/16 1346  BP: 118/60  Pulse: 78  Resp: 18  Temp: 97.7 F (36.5 C)  Weight: 169 lb 6.4 oz (76.8 kg)  Height: 5\' 7"  (1.702 m)   Body mass index is 26.53 kg/m. Physical Exam  Constitutional: She appears well-developed and well-nourished.  HENT:  Head: Normocephalic and atraumatic.  Eyes: Pupils are equal, round, and reactive to light. Conjunctivae and EOM are normal.  Neck: Normal range of motion. Neck supple.  Cardiovascular: Normal rate.   No murmur heard. Pulmonary/Chest: Effort normal. She has no rales.  Abdominal: Soft. Bowel sounds are normal. She exhibits no distension. There is no  tenderness.  Musculoskeletal: Normal range of motion. She exhibits no edema or tenderness.  Neurological: She is alert. No cranial nerve deficit.  Skin: Skin is warm and dry. No rash noted. No pallor.  Skin peeling in palm of hands.   Psychiatric: She has a normal mood and affect. Her behavior is normal.    Labs reviewed:  Recent Labs  07/14/16 1626  NA 137  K 5.0  CL 101  CO2 25  GLUCOSE 182*  BUN 24  CREATININE 0.69  CALCIUM 11.7*   No results for input(s): AST, ALT, ALKPHOS, BILITOT, PROT, ALBUMIN in the last 8760 hours.  Recent Labs  07/14/16 1626  WBC 10.3  NEUTROABS 9.2*  HGB 14.2  HCT 43.5  MCV 94  PLT 203   Lab Results  Component Value Date   TSH 1.76 08/20/2016   No results found for: HGBA1C No results found for: CHOL, HDL, LDLCALC, LDLDIRECT, TRIG, CHOLHDL  Significant Diagnostic Results in last 30 days:  No results found.  Assessment/Plan Hypothyroidism  Continue Levothyroxine 56mcg, last TSH 1.76 08/20/16  Paroxysmal A-fib (HCC) Heart rate is in control, continue Eliquis for thromboembolic risk reduction.   Chronic anticoagulation Continue Apixaban 5mg  bid po for afib  Late onset Alzheimer's disease without behavioral disturbance Hx of dementia, resides in Memory Care Unit, taking Namenda and Aricept to preserve memory, she is  repetitive, difficulty with long and short term memory recalls.  Constipation Stable, continue MiraLax daily.      Family/ staff Communication: SNF Memory Care Unit  Labs/tests ordered:  none

## 2016-09-16 NOTE — Assessment & Plan Note (Signed)
Continue Levothyroxine 80mcg, last TSH 1.76 08/20/16

## 2016-09-16 NOTE — Assessment & Plan Note (Signed)
Heart rate is in control, continue Eliquis for thromboembolic risk reduction.

## 2016-09-16 NOTE — Assessment & Plan Note (Signed)
Hx of dementia, resides in [facility], taking Namenda and Aricept to preserve memory, she is  repetitive, difficulty with long and short term memory recalls.

## 2016-09-16 NOTE — Assessment & Plan Note (Signed)
Stable, continue MiraLax daily.  

## 2016-09-16 NOTE — Assessment & Plan Note (Signed)
Continue Apixaban 5mg  bid po for afib

## 2016-10-01 ENCOUNTER — Non-Acute Institutional Stay (SKILLED_NURSING_FACILITY): Payer: Medicare Other | Admitting: Nurse Practitioner

## 2016-10-01 ENCOUNTER — Encounter: Payer: Self-pay | Admitting: Nurse Practitioner

## 2016-10-01 DIAGNOSIS — D229 Melanocytic nevi, unspecified: Secondary | ICD-10-CM | POA: Diagnosis not present

## 2016-10-01 DIAGNOSIS — L900 Lichen sclerosus et atrophicus: Secondary | ICD-10-CM | POA: Diagnosis not present

## 2016-10-01 NOTE — Progress Notes (Signed)
Location:  [facility] Room Number: 110 Place of Service:  SNF [phone]) Provider:  Damyah Gugel, Manxie  NP  Blanchie Serve, MD  Patient Care Team: Blanchie Serve, MD as PCP - General (Internal Medicine)  Extended Emergency Contact Information Primary Emergency Contact: [name],[name] Address: [address] [place] [address] [name] of [place] Phone: [phone] Mobile Phone: [phone] Relation: Other  Code Status:  DNR Goals of care: Advanced Directive information Advanced Directives 10/01/2016  Does Patient Have a Medical Advance Directive? Yes  Type of Advance Directive Out of facility DNR (pink MOST or yellow form)  Does patient want to make changes to medical advance directive? No - Patient declined  Copy of [place] in Chart? -  Would patient like information on creating a medical advance directive? -  Pre-existing out of facility DNR order (yellow form or pink MOST form) Yellow form placed in chart (order not valid for inpatient use)     Chief Complaint  Patient presents with  . Acute Visit    (R) thigh(back) mole    HPI:  Pt is a 83 y.o. female seen today for evaluation of the posterior right thigh skin lesion, raised, irritated area, duration unknown, painful when touched.   Hx of Lichen sclerosus, managed with topical agents.   Past Medical History:  Diagnosis Date  . Arthritis   . First degree AV block   . Hypothyroidism   . Hypothyroidism    left thyroid  . Lichen sclerosus   . OA (osteoarthritis)    hands  . Osteoporosis   . Paroxysmal atrial fibrillation (HCC)   . Sick sinus syndrome ([place])   . Vitamin D deficiency    Past Surgical History:  Procedure Laterality Date  . ABDOMINAL HYSTERECTOMY  1974  . APPENDECTOMY  1951  . EP IMPLANTABLE DEVICE N/A 08/13/2014   Procedure: Pacemaker Implant;  Surgeon: Deboraha Sprang, MD;  Location: [facility];  Service: Cardiovascular;   Laterality: N/A;  . HAND SURGERY  20years ago   open dislocation  . TONSILLECTOMY  1942   x2  . TOTAL KNEE ARTHROPLASTY  01/25/2012   Procedure: TOTAL KNEE ARTHROPLASTY;  Surgeon: Gearlean Alf, MD;  Location: [facility];  Service: Orthopedics;  Laterality: Right;    Allergies  Allergen Reactions  . Augmentin [Amoxicillin-Pot Clavulanate] Nausea And Vomiting  . Quinine Derivatives Other (See Comments)    Pt states "im just allergic" unknown reaction  . Penicillins Rash    Outpatient Encounter Prescriptions as of 10/01/2016  Medication Sig  . acetaminophen (TYLENOL) 500 MG tablet Take 500 mg by mouth every 6 (six) hours as needed (PAIN).   Marland Kitchen apixaban (ELIQUIS) 5 MG TABS tablet Take 1 tablet (5 mg total) by mouth 2 (two) times daily.  . clobetasol cream (TEMOVATE) 0.63 % Apply 1 application topically 2 (two) times daily.  Vladimir Faster Glycol-Propyl Glycol (SYSTANE) 0.4-0.3 % SOLN Apply 1 drop to eye 2 (two) times daily. For both eyes.  Marland Kitchen alendronate (FOSAMAX) 70 MG tablet Take 70 mg by mouth once a week. Take with a full glass of water on an empty stomach.  . Apoaequorin (PREVAGEN) 10 MG CAPS Take 1 capsule by mouth daily.  . Calcium-Magnesium-Vitamin D (CALCIUM 1200+D3 PO) Take 1 tablet by mouth daily.  . cetirizine (ZYRTEC) 10 MG tablet Take 10 mg by mouth at bedtime.   Marland Kitchen levothyroxine (SYNTHROID, LEVOTHROID) 75  MCG tablet Take 75 mcg by mouth daily before breakfast.  . Magnesium 500 MG CAPS Take 1 capsule by mouth daily.  . Memantine HCl-Donepezil HCl (NAMZARIC) 21-10 MG CP24 Take 1 tablet by mouth daily.  . mometasone (ELOCON) 0.1 % cream Apply 1 application topically 3 (three) times daily.  . Nutritional Supplements (ESTROVEN PO) Take 400 mcg by mouth daily.  . polyethylene glycol (MIRALAX / GLYCOLAX) packet Take 17 g by mouth daily.  Marland Kitchen triamcinolone cream (KENALOG) 0.1 % Apply 1 application topically 2 (two) times daily as needed. Apply to hands and legs   No facility-administered  encounter medications on file as of 10/01/2016.     Review of Systems  Constitutional: Negative for activity change and appetite change.  Musculoskeletal: Negative for gait problem.  Skin: Negative for rash and wound.       A size about 1x1cm, raised, irritated mole @ posterior left tight, no s/s of infection, 2-3 smaller scaly red spots, size <1x1cm noted in the area.   Psychiatric/Behavioral: Positive for confusion. Negative for agitation and behavioral problems.    Immunization History  Administered Date(s) Administered  . Influenza-Unspecified 12/04/2015  . PPD Test 08/12/2015, 09/02/2015, 09/04/2015  . Tdap 08/10/2014   Pertinent  Health Maintenance Due  Topic Date Due  . DEXA SCAN  10/03/1994  . PNA vac Low Risk Adult (1 of 2 - PCV13) 10/03/1994  . INFLUENZA VACCINE  09/16/2016   No flowsheet data found. Functional Status Survey:    Vitals:   10/01/16 1121  BP: 120/70  Pulse: 80  Resp: 20  Temp: 97.7 F (36.5 C)  SpO2: 95%  Weight: 169 lb 6.4 oz (76.8 kg)  Height: 5\' 7"  (1.702 m)   Body mass index is 26.53 kg/m. Physical Exam  Eyes: Conjunctivae are normal.  Neck: Neck supple.  Neurological: She is alert. No cranial nerve deficit.  Skin: Skin is warm and dry. No rash noted. No pallor.  Posterior left thigh mole, 2-3 small red scaly  Spots <1x1cm in the area.     Labs reviewed:  Recent Labs  07/14/16 1626  NA 137  K 5.0  CL 101  CO2 25  GLUCOSE 182*  BUN 24  CREATININE 0.69  CALCIUM 11.7*   No results for input(s): AST, ALT, ALKPHOS, BILITOT, PROT, ALBUMIN in the last 8760 hours.  Recent Labs  07/14/16 1626  WBC 10.3  NEUTROABS 9.2*  HGB 14.2  HCT 43.5  MCV 94  PLT 203   Lab Results  Component Value Date   TSH 1.76 08/20/2016   No results found for: HGBA1C No results found for: CHOL, HDL, LDLCALC, LDLDIRECT, TRIG, CHOLHDL  Significant Diagnostic Results in last 30 days:  No results found.  Assessment/Plan Atypical  mole Posterior left thigh, size about 1x1cm, raised, irritated, but no signs of infection, pending Dermatology evaluation, protective dressing daily, observe.   Lichen sclerosus Stable, except a 2-3 red scaly spots at the posterior left thigh. She has prn Triamcinolone cream available to her, Clobetasol cream bid, Mometasone tid to hands and legs. Also she takes Cetirizine 10mg  qhs for seasonal allergy symptoms. Hx of Lichen sclerosis.      Family/ staff Communication: plan of care reviewed with the patient and charge nurse. Dermatology referral.   Labs/tests ordered:  None  Time spend 25 minutes

## 2016-10-01 NOTE — Assessment & Plan Note (Signed)
Stable, except a 2-3 red scaly spots at the posterior left thigh. She has prn Triamcinolone cream available to her, Clobetasol cream bid, Mometasone tid to hands and legs. Also she takes Cetirizine 10mg  qhs for seasonal allergy symptoms. Hx of Lichen sclerosis.

## 2016-10-01 NOTE — Assessment & Plan Note (Signed)
Posterior left thigh, size about 1x1cm, raised, irritated, but no signs of infection, pending Dermatology evaluation, protective dressing daily, observe.

## 2016-10-12 ENCOUNTER — Encounter: Payer: Self-pay | Admitting: Nurse Practitioner

## 2016-10-12 ENCOUNTER — Non-Acute Institutional Stay (SKILLED_NURSING_FACILITY): Payer: Medicare Other | Admitting: Nurse Practitioner

## 2016-10-12 DIAGNOSIS — L900 Lichen sclerosus et atrophicus: Secondary | ICD-10-CM | POA: Diagnosis not present

## 2016-10-12 DIAGNOSIS — D229 Melanocytic nevi, unspecified: Secondary | ICD-10-CM | POA: Diagnosis not present

## 2016-10-12 NOTE — Assessment & Plan Note (Signed)
scaly raised skin growth scattered on upper arms, thighs, chest, back, onset and duration uncertain, some of them itching, one on the back of the right thigh is injured, will f/u dermatology for further evaluation and recommendation.

## 2016-10-12 NOTE — Progress Notes (Signed)
Location:  [facility] Room Number: 110 Place of Service:  SNF ([phone]) Provider:  Michio Thier, Manxie  NP  Blanchie Serve, MD  Patient Care Team: Blanchie Serve, MD as PCP - General (Internal Medicine)  Extended Emergency Contact Information Primary Emergency Contact: [name],[name] Address: [address] [place] [address] [name] of [place] Phone: [phone] Mobile Phone: [phone] Relation: Other  Code Status:  DNR Goals of care: Advanced Directive information Advanced Directives 10/12/2016  Does Patient Have a Medical Advance Directive? Yes  Type of Advance Directive Out of facility DNR (pink MOST or yellow form)  Does patient want to make changes to medical advance directive? No - Patient declined  Copy of [place] in Chart? -  Would patient like information on creating a medical advance directive? -  Pre-existing out of facility DNR order (yellow form or pink MOST form) Yellow form placed in chart (order not valid for inpatient use)     Chief Complaint  Patient presents with  . Acute Visit    Rash or bug bites to arm, chest and back    HPI:  Pt is a 83 y.o. female seen today for an acute visit for scaly raised skin growth scattered on upper arms, thighs, chest, back, onset and duration uncertain, some of them itching, one on the back of the right thigh is injured, hx of lichen Sclerosus, hs multiple topical agents prescribed by her dermatologist. She has hx of dementia, her HPI is proved with assistance of staff.    Past Medical History:  Diagnosis Date  . Arthritis   . First degree AV block   . Hypothyroidism   . Hypothyroidism    left thyroid  . Lichen sclerosus   . OA (osteoarthritis)    hands  . Osteoporosis   . Paroxysmal atrial fibrillation (HCC)   . Sick sinus syndrome ([facility])   . Vitamin D deficiency    Past Surgical History:  Procedure Laterality Date  . ABDOMINAL HYSTERECTOMY   1974  . APPENDECTOMY  1951  . EP IMPLANTABLE DEVICE N/A 08/13/2014   Procedure: Pacemaker Implant;  Surgeon: Deboraha Sprang, MD;  Location: [facility];  Service: Cardiovascular;  Laterality: N/A;  . HAND SURGERY  20years ago   open dislocation  . TONSILLECTOMY  1942   x2  . TOTAL KNEE ARTHROPLASTY  01/25/2012   Procedure: TOTAL KNEE ARTHROPLASTY;  Surgeon: Gearlean Alf, MD;  Location: [facility];  Service: Orthopedics;  Laterality: Right;    Allergies  Allergen Reactions  . Augmentin [Amoxicillin-Pot Clavulanate] Nausea And Vomiting  . Quinine Derivatives Other (See Comments)    Pt states "im just allergic" unknown reaction  . Penicillins Rash    Outpatient Encounter Prescriptions as of 10/12/2016  Medication Sig  . acetaminophen (TYLENOL) 500 MG tablet Take 500 mg by mouth every 6 (six) hours as needed (PAIN).   Marland Kitchen alendronate (FOSAMAX) 70 MG tablet Take 70 mg by mouth once a week. Take with a full glass of water on an empty stomach.  Marland Kitchen apixaban (ELIQUIS) 5 MG TABS tablet Take 1 tablet (5 mg total) by mouth 2 (two) times daily.  Marland Kitchen Apoaequorin (PREVAGEN) 10 MG CAPS Take 1 capsule by mouth daily.  . Calcium-Magnesium-Vitamin D (CALCIUM 1200+D3 PO) Take 1 tablet by mouth daily.  . cetirizine (ZYRTEC) 10 MG tablet Take 10 mg by mouth at bedtime.   . clobetasol  cream (TEMOVATE) 9.44 % Apply 1 application topically 2 (two) times daily.  Marland Kitchen levothyroxine (SYNTHROID, LEVOTHROID) 88 MCG tablet Take 88 mcg by mouth daily before breakfast.  . Magnesium 500 MG CAPS Take 1 capsule by mouth daily.  . Memantine HCl-Donepezil HCl (NAMZARIC) 21-10 MG CP24 Take 1 tablet by mouth daily.  . mometasone (ELOCON) 0.1 % cream Apply 1 application topically 3 (three) times daily.  . Nutritional Supplements (ESTROVEN PO) Take 400 mcg by mouth daily.  Vladimir Faster Glycol-Propyl Glycol (SYSTANE) 0.4-0.3 % SOLN Apply 1 drop to eye 2 (two) times daily. For both eyes.  . polyethylene glycol (MIRALAX / GLYCOLAX)  packet Take 17 g by mouth daily.  Marland Kitchen triamcinolone cream (KENALOG) 0.1 % Apply 1 application topically 2 (two) times daily as needed. Apply to hands and legs  . [DISCONTINUED] levothyroxine (SYNTHROID, LEVOTHROID) 75 MCG tablet Take 75 mcg by mouth daily before breakfast.   No facility-administered encounter medications on file as of 10/12/2016.    ROS was proved by the patient with assistance of staff Review of Systems  Constitutional: Negative for activity change, appetite change, chills, diaphoresis, fatigue and fever.  HENT: Positive for hearing loss. Negative for congestion, sinus pressure, sneezing and sore throat.   Eyes: Negative for pain, discharge, redness and itching.  Respiratory: Negative for cough.   Skin: Positive for rash. Negative for color change, pallor and wound.       Scattered scaly raised skin growth on limbs, chest, back, some of them itching. One on the posterior right thigh is injured.   Allergic/Immunologic: Negative for environmental allergies and food allergies.  Neurological:       Dementia.     Immunization History  Administered Date(s) Administered  . Influenza-Unspecified 12/04/2015  . PPD Test 08/12/2015, 09/02/2015, 09/04/2015  . Tdap 08/10/2014   Pertinent  Health Maintenance Due  Topic Date Due  . DEXA SCAN  10/03/1994  . PNA vac Low Risk Adult (1 of 2 - PCV13) 10/03/1994  . INFLUENZA VACCINE  09/16/2016   No flowsheet data found. Functional Status Survey:    Vitals:   10/12/16 1135  BP: 120/78  Pulse: 84  Resp: 18  Temp: 98.2 F (36.8 C)  Weight: 169 lb 6.4 oz (76.8 kg)  Height: 5\' 7"  (1.702 m)   Body mass index is 26.53 kg/m. Physical Exam  Constitutional: She appears well-developed and well-nourished. No distress.  Neurological: She is alert.  Oriented to self.   Skin: Skin is warm and dry. No rash noted. She is not diaphoretic. No erythema. No pallor.  Multiple raised scaly skin growth about a pea size on arms, legs, chest,  back, some of them itching. One large "mole" about a quarter size @ the posterior right thigh is injured, covered with a bandage, no s/s of infection.     Labs reviewed:  Recent Labs  07/14/16 1626  NA 137  K 5.0  CL 101  CO2 25  GLUCOSE 182*  BUN 24  CREATININE 0.69  CALCIUM 11.7*   No results for input(s): AST, ALT, ALKPHOS, BILITOT, PROT, ALBUMIN in the last 8760 hours.  Recent Labs  07/14/16 1626  WBC 10.3  NEUTROABS 9.2*  HGB 14.2  HCT 43.5  MCV 94  PLT 203   Lab Results  Component Value Date   TSH 1.76 08/20/2016   No results found for: HGBA1C No results found for: CHOL, HDL, LDLCALC, LDLDIRECT, TRIG, CHOLHDL  Significant Diagnostic Results in last 30 days:  No results  found.  Assessment/Plan Lichen sclerosus prn Triamcinolone cream available to her, Clobetasol cream bid, Mometasone tid to hands and legs. Also she takes Cetirizine 10mg  qhs for seasonal allergy symptoms  Atypical mole scaly raised skin growth scattered on upper arms, thighs, chest, back, onset and duration uncertain, some of them itching, one on the back of the right thigh is injured, will f/u dermatology for further evaluation and recommendation.      Family/ staff Communication: plan of care reviewed with the patient and charge nurse.   Labs/tests ordered:  none  Time spend 25 minutes

## 2016-10-12 NOTE — Assessment & Plan Note (Signed)
prn Triamcinolone cream available to her, Clobetasol cream bid, Mometasone tid to hands and legs. Also she takes Cetirizine 10mg  qhs for seasonal allergy symptoms

## 2016-10-13 ENCOUNTER — Encounter: Payer: Medicare Other | Admitting: *Deleted

## 2016-10-16 ENCOUNTER — Encounter: Payer: Self-pay | Admitting: Cardiology

## 2016-10-16 ENCOUNTER — Non-Acute Institutional Stay (SKILLED_NURSING_FACILITY): Payer: Medicare Other

## 2016-10-16 DIAGNOSIS — Z0000 Encounter for general adult medical examination without abnormal findings: Secondary | ICD-10-CM | POA: Diagnosis not present

## 2016-10-16 NOTE — Patient Instructions (Signed)
Kristine Holden , Thank you for taking time to come for your Medicare Wellness Visit. I appreciate your ongoing commitment to your health goals. Please review the following plan we discussed and let me know if I can assist you in the future.   Screening recommendations/referrals: Colonoscopy excluded, pt over age 81 Mammogram excluded, pt over age 106 Bone Density up to date Recommended yearly ophthalmology/optometry visit for glaucoma screening and checkup Recommended yearly dental visit for hygiene and checkup  Vaccinations: Influenza vaccine due Pneumococcal vaccine up to date Tdap vaccine up to date. Due 08/09/24 Shingles vaccine not in records   Advanced directives: Need for chart.  Conditions/risks identified: None  Next appointment: Dr. Bubba Camp makes rounds   Preventive Care 40 Years and Older, Female Preventive care refers to lifestyle choices and visits with your health care provider that can promote health and wellness. What does preventive care include?  A yearly physical exam. This is also called an annual well check.  Dental exams once or twice a year.  Routine eye exams. Ask your health care provider how often you should have your eyes checked.  Personal lifestyle choices, including:  Daily care of your teeth and gums.  Regular physical activity.  Eating a healthy diet.  Avoiding tobacco and drug use.  Limiting alcohol use.  Practicing safe sex.  Taking low-dose aspirin every day.  Taking vitamin and mineral supplements as recommended by your health care provider. What happens during an annual well check? The services and screenings done by your health care provider during your annual well check will depend on your age, overall health, lifestyle risk factors, and family history of disease. Counseling  Your health care provider may ask you questions about your:  Alcohol use.  Tobacco use.  Drug use.  Emotional well-being.  Home and relationship  well-being.  Sexual activity.  Eating habits.  History of falls.  Memory and ability to understand (cognition).  Work and work Statistician.  Reproductive health. Screening  You may have the following tests or measurements:  Height, weight, and BMI.  Blood pressure.  Lipid and cholesterol levels. These may be checked every 5 years, or more frequently if you are over 32 years old.  Skin check.  Lung cancer screening. You may have this screening every year starting at age 102 if you have a 30-pack-year history of smoking and currently smoke or have quit within the past 15 years.  Fecal occult blood test (FOBT) of the stool. You may have this test every year starting at age 73.  Flexible sigmoidoscopy or colonoscopy. You may have a sigmoidoscopy every 5 years or a colonoscopy every 10 years starting at age 73.  Hepatitis C blood test.  Hepatitis B blood test.  Sexually transmitted disease (STD) testing.  Diabetes screening. This is done by checking your blood sugar (glucose) after you have not eaten for a while (fasting). You may have this done every 1-3 years.  Bone density scan. This is done to screen for osteoporosis. You may have this done starting at age 76.  Mammogram. This may be done every 1-2 years. Talk to your health care provider about how often you should have regular mammograms. Talk with your health care provider about your test results, treatment options, and if necessary, the need for more tests. Vaccines  Your health care provider may recommend certain vaccines, such as:  Influenza vaccine. This is recommended every year.  Tetanus, diphtheria, and acellular pertussis (Tdap, Td) vaccine. You may need a  Td booster every 10 years.  Zoster vaccine. You may need this after age 86.  Pneumococcal 13-valent conjugate (PCV13) vaccine. One dose is recommended after age 70.  Pneumococcal polysaccharide (PPSV23) vaccine. One dose is recommended after age  69. Talk to your health care provider about which screenings and vaccines you need and how often you need them. This information is not intended to replace advice given to you by your health care provider. Make sure you discuss any questions you have with your health care provider. Document Released: 03/01/2015 Document Revised: 10/23/2015 Document Reviewed: 12/04/2014 Elsevier Interactive Patient Education  2017 [place] Prevention in the Home Falls can cause injuries. They can happen to people of all ages. There are many things you can do to make your home safe and to help prevent falls. What can I do on the outside of my home?  Regularly fix the edges of walkways and driveways and fix any cracks.  Remove anything that might make you trip as you walk through a door, such as a raised step or threshold.  Trim any bushes or trees on the path to your home.  Use bright outdoor lighting.  Clear any walking paths of anything that might make someone trip, such as rocks or tools.  Regularly check to see if handrails are loose or broken. Make sure that both sides of any steps have handrails.  Any raised decks and porches should have guardrails on the edges.  Have any leaves, snow, or ice cleared regularly.  Use sand or salt on walking paths during winter.  Clean up any spills in your garage right away. This includes oil or grease spills. What can I do in the bathroom?  Use night lights.  Install grab bars by the toilet and in the tub and shower. Do not use towel bars as grab bars.  Use non-skid mats or decals in the tub or shower.  If you need to sit down in the shower, use a plastic, non-slip stool.  Keep the floor dry. Clean up any water that spills on the floor as soon as it happens.  Remove soap buildup in the tub or shower regularly.  Attach bath mats securely with double-sided non-slip rug tape.  Do not have throw rugs and other things on the floor that can make  you trip. What can I do in the bedroom?  Use night lights.  Make sure that you have a light by your bed that is easy to reach.  Do not use any sheets or blankets that are too big for your bed. They should not hang down onto the floor.  Have a firm chair that has side arms. You can use this for support while you get dressed.  Do not have throw rugs and other things on the floor that can make you trip. What can I do in the kitchen?  Clean up any spills right away.  Avoid walking on wet floors.  Keep items that you use a lot in easy-to-reach places.  If you need to reach something above you, use a strong step stool that has a grab bar.  Keep electrical cords out of the way.  Do not use floor polish or wax that makes floors slippery. If you must use wax, use non-skid floor wax.  Do not have throw rugs and other things on the floor that can make you trip. What can I do with my stairs?  Do not leave any items on the stairs.  Make sure that there are handrails on both sides of the stairs and use them. Fix handrails that are broken or loose. Make sure that handrails are as long as the stairways.  Check any carpeting to make sure that it is firmly attached to the stairs. Fix any carpet that is loose or worn.  Avoid having throw rugs at the top or bottom of the stairs. If you do have throw rugs, attach them to the floor with carpet tape.  Make sure that you have a light switch at the top of the stairs and the bottom of the stairs. If you do not have them, ask someone to add them for you. What else can I do to help prevent falls?  Wear shoes that:  Do not have high heels.  Have rubber bottoms.  Are comfortable and fit you well.  Are closed at the toe. Do not wear sandals.  If you use a stepladder:  Make sure that it is fully opened. Do not climb a closed stepladder.  Make sure that both sides of the stepladder are locked into place.  Ask someone to hold it for you, if  possible.  Clearly mark and make sure that you can see:  Any grab bars or handrails.  First and last steps.  Where the edge of each step is.  Use tools that help you move around (mobility aids) if they are needed. These include:  Canes.  Walkers.  Scooters.  Crutches.  Turn on the lights when you go into a dark area. Replace any light bulbs as soon as they burn out.  Set up your furniture so you have a clear path. Avoid moving your furniture around.  If any of your floors are uneven, fix them.  If there are any pets around you, be aware of where they are.  Review your medicines with your doctor. Some medicines can make you feel dizzy. This can increase your chance of falling. Ask your doctor what other things that you can do to help prevent falls. This information is not intended to replace advice given to you by your health care provider. Make sure you discuss any questions you have with your health care provider. Document Released: 11/29/2008 Document Revised: 07/11/2015 Document Reviewed: 03/09/2014 Elsevier Interactive Patient Education  2017 [employer].

## 2016-10-16 NOTE — Progress Notes (Signed)
Subjective:   Kristine Holden is a 83 y.o. female who presents for Medicare Annual (Subsequent) preventive examination at [facility]; incapacitated patient unable to answer questions appropriately   Last AWV-05/07/14    Objective:     Vitals: BP 110/60 (BP Location: Left Arm, Patient Position: Sitting)   Pulse 85   Temp (!) 97.1 F (36.2 C) (Oral)   Ht 5\' 7"  (1.702 m)   Wt 169 lb (76.7 kg)   LMP  (LMP Unknown)   SpO2 94%   BMI 26.47 kg/m   Body mass index is 26.47 kg/m.   Tobacco History  Smoking Status  . Never Smoker  Smokeless Tobacco  . Never Used     Counseling given: Not Answered   Past Medical History:  Diagnosis Date  . Arthritis   . First degree AV block   . Hypothyroidism   . Hypothyroidism    left thyroid  . Lichen sclerosus   . OA (osteoarthritis)    hands  . Osteoporosis   . Paroxysmal atrial fibrillation (HCC)   . Sick sinus syndrome ([facility])   . Vitamin D deficiency    Past Surgical History:  Procedure Laterality Date  . ABDOMINAL HYSTERECTOMY  1974  . APPENDECTOMY  1951  . EP IMPLANTABLE DEVICE N/A 08/13/2014   Procedure: Pacemaker Implant;  Surgeon: Deboraha Sprang, MD;  Location: [facility];  Service: Cardiovascular;  Laterality: N/A;  . HAND SURGERY  20years ago   open dislocation  . TONSILLECTOMY  1942   x2  . TOTAL KNEE ARTHROPLASTY  01/25/2012   Procedure: TOTAL KNEE ARTHROPLASTY;  Surgeon: Gearlean Alf, MD;  Location: [facility];  Service: Orthopedics;  Laterality: Right;   Family History  Problem Relation Age of Onset  . Heart attack Father   . Heart disease Father    History  Sexual Activity  . Sexual activity: Not on file    Outpatient Encounter Prescriptions as of 10/16/2016  Medication Sig  . acetaminophen (TYLENOL) 500 MG tablet Take 500 mg by mouth every 6 (six) hours as needed (PAIN).   Marland Kitchen alendronate (FOSAMAX) 70 MG tablet Take 70 mg by mouth once a week. Take with a full glass of water  on an empty stomach.  Marland Kitchen apixaban (ELIQUIS) 5 MG TABS tablet Take 1 tablet (5 mg total) by mouth 2 (two) times daily.  Marland Kitchen Apoaequorin (PREVAGEN) 10 MG CAPS Take 1 capsule by mouth daily.  . Calcium-Magnesium-Vitamin D (CALCIUM 1200+D3 PO) Take 1 tablet by mouth daily.  . cetirizine (ZYRTEC) 10 MG tablet Take 10 mg by mouth at bedtime.   . clobetasol cream (TEMOVATE) 4.27 % Apply 1 application topically 2 (two) times daily.  Marland Kitchen levothyroxine (SYNTHROID, LEVOTHROID) 88 MCG tablet Take 88 mcg by mouth daily before breakfast.  . Lutein-Zeaxanthin 20-1 MG CAPS Take 1 tablet by mouth daily.  . Magnesium 500 MG CAPS Take 1 capsule by mouth daily.  . Memantine HCl-Donepezil HCl (NAMZARIC) 21-10 MG CP24 Take 1 tablet by mouth daily.  . mometasone (ELOCON) 0.1 % cream Apply 1 application topically 3 (three) times daily.  . Nutritional Supplements (ESTROVEN PO) Take 400 mcg by mouth daily.  Vladimir Faster Glycol-Propyl Glycol (SYSTANE) 0.4-0.3 % SOLN Apply 1 drop to eye 2 (two) times daily. For both eyes.  . polyethylene glycol (MIRALAX / GLYCOLAX) packet Take 17 g by mouth daily.  Marland Kitchen triamcinolone cream (KENALOG) 0.1 % Apply 1 application topically 2 (two) times daily  as needed. Apply to hands and legs   No facility-administered encounter medications on file as of 10/16/2016.     Activities of Daily Living In your present state of health, do you have any difficulty performing the following activities: 10/16/2016  Hearing? N  Vision? N  Difficulty concentrating or making decisions? Y  Walking or climbing stairs? Y  Dressing or bathing? Y  Doing errands, shopping? Y  Preparing Food and eating ? Y  Using the Toilet? N  In the past six months, have you accidently leaked urine? N  Do you have problems with loss of bowel control? N  Managing your Medications? Y  Managing your Finances? Y  Housekeeping or managing your Housekeeping? Y  Some recent data might be hidden    Patient Care Team: Blanchie Serve, MD as PCP - General (Internal Medicine)    Assessment:     Exercise Activities and Dietary recommendations Current Exercise Habits: The patient does not participate in regular exercise at present, Exercise limited by: neurologic condition(s)  Goals    None     Fall Risk Fall Risk  10/16/2016  Falls in the past year? No   Depression Screen PHQ 2/9 Scores 10/16/2016  PHQ - 2 Score 0     Cognitive Function MMSE - Mini Mental State Exam 10/16/2016  Orientation to time 0  Orientation to Place 2  Registration 3  Attention/ Calculation 0  Recall 0  Language- name 2 objects 2  Language- repeat 1  Language- follow 3 step command 3  Language- read & follow direction 1  Write a sentence 1  Copy design 1  Total score 14        Immunization History  Administered Date(s) Administered  . Influenza-Unspecified 12/04/2015  . PPD Test 08/12/2015, 09/02/2015, 09/04/2015  . Tdap 08/10/2014   Screening Tests Health Maintenance  Topic Date Due  . INFLUENZA VACCINE  09/16/2016  . TETANUS/TDAP  08/09/2024  . DEXA SCAN  Completed  . PNA vac Low Risk Adult  Completed      Plan:    I have personally reviewed and addressed the Medicare Annual Wellness questionnaire and have noted the following in the patient's chart:  A. Medical and social history B. Use of alcohol, tobacco or illicit drugs  C. Current medications and supplements D. Functional ability and status E.  Nutritional status F.  Physical activity G. Advance directives H. List of other physicians I.  Hospitalizations, surgeries, and ER visits in previous 12 months J.  [place] to include hearing, vision, cognitive, depression L. Referrals and appointments - none  In addition, I am unable to review and discuss with incapacitated patient certain preventive protocols, quality metrics, and best practice recommendations. A written personalized care plan for preventive services as well as general  preventive health recommendations were provided to patient.   See attached scanned questionnaire for additional information.   Signed,   Rich Reining, RN Nurse Health Advisor   Quick Notes   Health Maintenance: Pt will get flu vaccine from facility.     Abnormal Screen: MMSE 14/20. Did not pass clock drawing     Patient Concerns: None    Nurse Concerns: None

## 2016-11-12 ENCOUNTER — Non-Acute Institutional Stay (SKILLED_NURSING_FACILITY): Payer: Medicare Other | Admitting: Internal Medicine

## 2016-11-12 ENCOUNTER — Encounter: Payer: Self-pay | Admitting: Internal Medicine

## 2016-11-12 DIAGNOSIS — F0280 Dementia in other diseases classified elsewhere without behavioral disturbance: Secondary | ICD-10-CM

## 2016-11-12 DIAGNOSIS — E039 Hypothyroidism, unspecified: Secondary | ICD-10-CM

## 2016-11-12 DIAGNOSIS — K5900 Constipation, unspecified: Secondary | ICD-10-CM

## 2016-11-12 DIAGNOSIS — I480 Paroxysmal atrial fibrillation: Secondary | ICD-10-CM | POA: Diagnosis not present

## 2016-11-12 DIAGNOSIS — G301 Alzheimer's disease with late onset: Secondary | ICD-10-CM | POA: Diagnosis not present

## 2016-11-12 DIAGNOSIS — L900 Lichen sclerosus et atrophicus: Secondary | ICD-10-CM

## 2016-11-12 DIAGNOSIS — M810 Age-related osteoporosis without current pathological fracture: Secondary | ICD-10-CM

## 2016-11-12 NOTE — Progress Notes (Signed)
Location:  [facility] Room Number: 110  Place of Service:  SNF [phone]) Provider:  Blanchie Serve MD  Blanchie Serve, MD  Patient Care Team: Blanchie Serve, MD as PCP - General (Internal Medicine)  Extended Emergency Contact Information Primary Emergency Contact: [name],[name] Address: [address] [place] [address] [name] of [place] Phone: [phone] Mobile Phone: [phone] Relation: Other  Code Status:  DNR  Goals of care: Advanced Directive information Advanced Directives 11/12/2016  Does Patient Have a Medical Advance Directive? Yes  Type of Advance Directive Out of facility DNR (pink MOST or yellow form)  Does patient want to make changes to medical advance directive? No - Patient declined  Copy of [place] in Chart? -  Would patient like information on creating a medical advance directive? -  Pre-existing out of facility DNR order (yellow form or pink MOST form) Yellow form placed in chart (order not valid for inpatient use)     Chief Complaint  Patient presents with  . Medical Management of Chronic Issues    Routine Visit     HPI:  Pt is a 83 y.o. female seen today for medical management of chronic diseases. She is in memory care unit. She gets around unassisted. No fall reported. She denies any concern this visit. She is continent with her bowel and bladder habits. She is compliant with her medications.     Past Medical History:  Diagnosis Date  . Arthritis   . First degree AV block   . Hypothyroidism   . Hypothyroidism    left thyroid  . Lichen sclerosus   . OA (osteoarthritis)    hands  . Osteoporosis   . Paroxysmal atrial fibrillation (HCC)   . Sick sinus syndrome ([place])   . Vitamin D deficiency    Past Surgical History:  Procedure Laterality Date  . ABDOMINAL HYSTERECTOMY  1974  . APPENDECTOMY  1951  . EP IMPLANTABLE DEVICE N/A 08/13/2014   Procedure: Pacemaker  Implant;  Surgeon: Deboraha Sprang, MD;  Location: [facility];  Service: Cardiovascular;  Laterality: N/A;  . HAND SURGERY  20years ago   open dislocation  . TONSILLECTOMY  1942   x2  . TOTAL KNEE ARTHROPLASTY  01/25/2012   Procedure: TOTAL KNEE ARTHROPLASTY;  Surgeon: Gearlean Alf, MD;  Location: [facility];  Service: Orthopedics;  Laterality: Right;    Allergies  Allergen Reactions  . Augmentin [Amoxicillin-Pot Clavulanate] Nausea And Vomiting  . Quinine Derivatives Other (See Comments)    Pt states "im just allergic" unknown reaction  . Penicillins Rash    Outpatient Encounter Prescriptions as of 11/12/2016  Medication Sig  . acetaminophen (TYLENOL) 500 MG tablet Take 500 mg by mouth every 6 (six) hours as needed (PAIN).   Marland Kitchen alendronate (FOSAMAX) 70 MG tablet Take 70 mg by mouth once a week. Take with a full glass of water on an empty stomach.  Marland Kitchen apixaban (ELIQUIS) 5 MG TABS tablet Take 1 tablet (5 mg total) by mouth 2 (two) times daily.  Marland Kitchen Apoaequorin (PREVAGEN) 10 MG CAPS Take 1 capsule by mouth daily.  . Calcium-Magnesium-Vitamin D (CALCIUM 1200+D3 PO) Take 1 tablet by mouth daily.  . cetirizine (ZYRTEC) 10 MG tablet Take 10 mg by mouth at bedtime.   . clobetasol cream (TEMOVATE) 0.86 % Apply 1 application topically 2 (two) times daily as needed.   Marland Kitchen levothyroxine (SYNTHROID,  LEVOTHROID) 88 MCG tablet Take 88 mcg by mouth daily before breakfast.  . Lutein-Zeaxanthin 20-1 MG CAPS Take 1 tablet by mouth daily.  . Magnesium 500 MG CAPS Take 1 capsule by mouth daily.  . Memantine HCl-Donepezil HCl (NAMZARIC) 21-10 MG CP24 Take 1 tablet by mouth daily.  . mometasone (ELOCON) 0.1 % cream Apply 1 application topically as needed.   . Nutritional Supplements (ESTROVEN PO) Take 400 mcg by mouth daily.  Vladimir Faster Glycol-Propyl Glycol (SYSTANE) 0.4-0.3 % SOLN Apply 1 drop to eye 2 (two) times daily. For both eyes.  . polyethylene glycol (MIRALAX / GLYCOLAX) packet Take 17 g by mouth  daily.  Marland Kitchen triamcinolone cream (KENALOG) 0.1 % Apply 1 application topically 2 (two) times daily as needed. Apply to hands and legs   No facility-administered encounter medications on file as of 11/12/2016.     Review of Systems  Constitutional: Negative for chills and fever.  HENT: Negative for congestion, mouth sores, rhinorrhea and trouble swallowing.   Eyes: Negative for visual disturbance.  Respiratory: Negative for cough and shortness of breath.   Cardiovascular: Negative for chest pain and palpitations.  Gastrointestinal: Negative for abdominal pain, constipation, diarrhea, nausea, rectal pain and vomiting.  Genitourinary: Negative for dysuria, flank pain and hematuria.  Musculoskeletal: Negative for arthralgias and back pain.  Skin: Negative for rash.  Neurological: Negative for dizziness, tremors, seizures and headaches.  Psychiatric/Behavioral: Positive for confusion. Negative for behavioral problems.    Immunization History  Administered Date(s) Administered  . Influenza-Unspecified 12/04/2015  . PPD Test 08/12/2015, 09/02/2015, 09/04/2015  . Tdap 08/10/2014   Pertinent  Health Maintenance Due  Topic Date Due  . INFLUENZA VACCINE  09/16/2016  . DEXA SCAN  Completed  . PNA vac Low Risk Adult  Completed   Fall Risk  10/16/2016  Falls in the past year? No   Functional Status Survey:    Vitals:   11/12/16 1612  BP: 120/80  Pulse: 82  Resp: 20  Temp: (!) 97.4 F (36.3 C)  TempSrc: Oral  SpO2: 95%  Weight: 165 lb 3.2 oz (74.9 kg)  Height: 5\' 7"  (1.702 m)   Body mass index is 25.87 kg/m. Physical Exam  Constitutional: She appears well-developed and well-nourished. No distress.  HENT:  Head: Normocephalic and atraumatic.  Mouth/Throat: Oropharynx is clear and moist.  Eyes: Pupils are equal, round, and reactive to light. Conjunctivae and EOM are normal.  Neck: Normal range of motion. Neck supple.  Cardiovascular: Normal rate and regular rhythm.   No murmur  heard. Pulmonary/Chest: Effort normal and breath sounds normal. No respiratory distress. She has no rales.  Abdominal: Soft. Bowel sounds are normal. She exhibits no distension. There is no tenderness. There is no guarding.  Musculoskeletal: Normal range of motion. She exhibits no edema.  Can move all 4 extremities  Lymphadenopathy:    She has no cervical adenopathy.  Neurological: She is alert.  Oriented to self  Skin: Skin is warm and dry. She is not diaphoretic.  Rosacea to face  Psychiatric: She has a normal mood and affect.    Labs reviewed:  Recent Labs  07/14/16 1626  NA 137  K 5.0  CL 101  CO2 25  GLUCOSE 182*  BUN 24  CREATININE 0.69  CALCIUM 11.7*   No results for input(s): AST, ALT, ALKPHOS, BILITOT, PROT, ALBUMIN in the last 8760 hours.  Recent Labs  07/14/16 1626  WBC 10.3  NEUTROABS 9.2*  HGB 14.2  HCT 43.5  MCV  94  PLT 203   Lab Results  Component Value Date   TSH 1.76 08/20/2016   No results found for: HGBA1C No results found for: CHOL, HDL, LDLCALC, LDLDIRECT, TRIG, CHOLHDL  Significant Diagnostic Results in last 30 days:  No results found.  Assessment/Plan  Hypothyroidism Reviewed TSH. Continue levothyroxine 88 mcg for now  Osteoporosis No fall or fracture reported. Continue weekly fosamax. Also on ca-mg-vit d supplement.   Hypercalcemia With her on fosamax, check calcium level with ionized calcium and d/c fosamax if needed.  Chronic constipation Has bowel movement per nursing, continue miralax for now.   Alzheimer's disease Monitor for behavior changes. Supportive care for now. Continue namzaric.   afib Controlled heart rate. Followed by cardiology. Continue eliquis.  Lichen sclerosis Continue mometasone cream and clobetasol cream with triamcinolone   Family/ staff Communication: reviewed care plan with patient and charge nurse.    Labs/tests ordered:  Cmp, ionized calcium, lipid   Blanchie Serve, MD Internal  Medicine [facility] [address] [place], [place] [address] Cell Phone (Monday-Friday 8 am - 5 pm): [phone] On Call: [phone] and follow prompts after 5 pm and on weekends Office Phone: [phone] Office Fax: [phone]

## 2016-11-17 LAB — BASIC METABOLIC PANEL
BUN: 13 (ref 4–21)
Creatinine: 0.8 (ref <=1.1)
GLUCOSE: 141
POTASSIUM: 4 (ref 3.4–5.3)
Sodium: 141 (ref 137–147)

## 2016-11-17 LAB — LIPID PANEL
CHOLESTEROL: 203 — AB (ref 0–200)
HDL: 39 (ref 35–70)
LDL CALC: 126
LDl/HDL Ratio: 5.2
Triglycerides: 236 — AB (ref 40–160)

## 2016-11-17 LAB — HEPATIC FUNCTION PANEL
ALK PHOS: 56 (ref 25–125)
ALT: 15 (ref 7–35)
AST: 17 (ref 13–35)
BILIRUBIN, TOTAL: 0.4

## 2016-11-19 ENCOUNTER — Non-Acute Institutional Stay (SKILLED_NURSING_FACILITY): Payer: Medicare Other | Admitting: Adult Health

## 2016-11-19 ENCOUNTER — Encounter: Payer: Self-pay | Admitting: *Deleted

## 2016-11-19 ENCOUNTER — Other Ambulatory Visit: Payer: Self-pay | Admitting: *Deleted

## 2016-11-19 ENCOUNTER — Non-Acute Institutional Stay: Payer: Self-pay | Admitting: *Deleted

## 2016-11-19 DIAGNOSIS — E781 Pure hyperglyceridemia: Secondary | ICD-10-CM | POA: Diagnosis not present

## 2016-11-19 NOTE — Progress Notes (Signed)
DATE:  11/19/2016   MRN:  [id number]  BIRTHDAY: [birth year]  Facility:  Nursing Home Location:  [facility] Room Number: 110  LEVEL OF CARE:  SNF (31)  Contact Information    Name Relation Home Work [place] Other [phone]  [phone]       Code Status History    Date Active Date Inactive Code Status Order ID Comments User Context   08/13/2014  4:37 PM 08/14/2014  4:12 PM Full Code [id number]  Deboraha Sprang, MD Inpatient   08/10/2014  8:04 PM 08/13/2014  4:37 PM Full Code [id number]  Lavina Hamman, MD ED   01/25/2012  2:36 PM 01/28/2012  5:42 PM Full Code [id number]  Silk, Steva Colder, RN Inpatient    Advance Directive Documentation     Most Recent Value  Type of Advance Directive  Out of facility DNR (pink MOST or yellow form)  Pre-existing out of facility DNR order (yellow form or pink MOST form)  -  "MOST" Form in Place?  -       Chief Complaint  Patient presents with  . Acute Visit    Elevated Triglycerides    HISTORY OF PRESENT ILLNESS: This is an 83 year old female who is being seen for an acute visit. She is a resident of [facility]. Lipid panel noted to be abnormal - cholesterol 203 (elevated), triglycerides 236 (elevated). She was seen in her room today. She has PMH of hypothyroidism, osteoporosis, arthritis and PAF.    PAST MEDICAL HISTORY:  Past Medical History:  Diagnosis Date  . Arthritis   . First degree AV block   . Hypothyroidism   . Hypothyroidism    left thyroid  . Lichen sclerosus   . OA (osteoarthritis)    hands  . Osteoporosis   . Paroxysmal atrial fibrillation (HCC)   . Sick sinus syndrome ([place])   . Vitamin D deficiency      CURRENT MEDICATIONS: Reviewed  Patient's Medications  New Prescriptions   No medications on file  Previous Medications   ACETAMINOPHEN (TYLENOL) 500 MG TABLET    Take 500 mg by mouth every 6 (six) hours as needed (PAIN).    ALENDRONATE (FOSAMAX) 70 MG TABLET     Take 70 mg by mouth once a week. Take with a full glass of water on an empty stomach.   APIXABAN (ELIQUIS) 5 MG TABS TABLET    Take 1 tablet (5 mg total) by mouth 2 (two) times daily.   APOAEQUORIN (PREVAGEN) 10 MG CAPS    Take 1 capsule by mouth daily.   CALCIUM-MAGNESIUM-VITAMIN D (CALCIUM 1200+D3 PO)    Take 1 tablet by mouth daily.   CETIRIZINE (ZYRTEC) 10 MG TABLET    Take 10 mg by mouth at bedtime.    CLOBETASOL CREAM (TEMOVATE) 0.05 %    Apply 1 application topically 2 (two) times daily as needed.    FENOFIBRATE 160 MG TABLET    Take 160 mg by mouth daily.   LEVOTHYROXINE (SYNTHROID, LEVOTHROID) 88 MCG TABLET    Take 88 mcg by mouth daily before breakfast.   LUTEIN-ZEAXANTHIN 20-1 MG CAPS    Take 1 tablet by mouth daily.   MAGNESIUM 500 MG CAPS    Take 1 capsule by mouth daily.   MEMANTINE HCL-DONEPEZIL HCL (NAMZARIC) 21-10 MG CP24    Take 1 tablet by mouth daily.   MOMETASONE (ELOCON) 0.1 % CREAM    Apply 1 application topically  as needed.    NUTRITIONAL SUPPLEMENTS (ESTROVEN PO)    Take 400 mcg by mouth daily.   POLYETHYL GLYCOL-PROPYL GLYCOL (SYSTANE) 0.4-0.3 % SOLN    Apply 1 drop to eye 2 (two) times daily. For both eyes.   POLYETHYLENE GLYCOL (MIRALAX / GLYCOLAX) PACKET    Take 17 g by mouth daily.   TRIAMCINOLONE CREAM (KENALOG) 0.1 %    Apply 1 application topically 2 (two) times daily as needed. Apply to hands and legs  Modified Medications   No medications on file  Discontinued Medications   No medications on file     Allergies  Allergen Reactions  . Augmentin [Amoxicillin-Pot Clavulanate] Nausea And Vomiting  . Quinine Derivatives Other (See Comments)    Pt states "im just allergic" unknown reaction  . Penicillins Rash     REVIEW OF SYSTEMS:  GENERAL: no change in appetite, no fatigue, no weight changes, no fever, chills or weakness MOUTH and THROAT: Denies oral discomfort RESPIRATORY: no cough, SOB, DOE, wheezing, hemoptysis CARDIAC: no chest pain, edema or  palpitations GI: no abdominal pain, diarrhea, constipation, heart burn, nausea or vomiting GU: Denies dysuria, frequency, hematuria PSYCHIATRIC: Denies feeling of depression or anxiety. No report of hallucinations, insomnia, paranoia, or agitation   PHYSICAL EXAMINATION  GENERAL APPEARANCE: Well nourished. In no acute distress. Normal body habitus SKIN:  Skin is warm and dry.  MOUTH and THROAT: Lips are without lesions. Oral mucosa is moist and without lesions. RESPIRATORY: breathing is even & unlabored, BS CTAB CARDIAC: RRR, no murmur,no extra heart sounds, no edema, +left chest pacemaker GI: abdomen soft, normal BS, no masses, no tenderness, no hepatomegaly, no splenomegaly EXTREMITIES: Able to move X 4 extremities  PSYCHIATRIC: Alert to self, disoriented to time and place. Affect and behavior are appropriate   LABS/RADIOLOGY: Labs reviewed: Basic Metabolic Panel:  Recent Labs  07/14/16 1626 11/17/16  NA 137 141  K 5.0 4.0  CL 101  --   CO2 25  --   GLUCOSE 182*  --   BUN 24 13  CREATININE 0.69 0.8  CALCIUM 11.7*  --    Liver Function Tests:  Recent Labs  11/17/16  AST 17  ALT 15  ALKPHOS 56   CBC:  Recent Labs  07/14/16 1626  WBC 10.3  NEUTROABS 9.2*  HGB 14.2  HCT 43.5  MCV 94  PLT 203   Lipid Panel:  Recent Labs  11/17/16  HDL 39     ASSESSMENT/PLAN:  1. Hypertriglyceridemia - triglycerides 236, elevated, start Fenofibrate 160 mg daily    Jung Yurchak C. [place] - NP  [employer] [phone]

## 2016-11-23 ENCOUNTER — Encounter: Payer: Self-pay | Admitting: Family

## 2016-11-23 NOTE — Progress Notes (Signed)
This encounter was created in error - please disregard.

## 2016-11-26 ENCOUNTER — Encounter: Payer: Self-pay | Admitting: Adult Health

## 2016-11-26 NOTE — Progress Notes (Signed)
DATE: 11/19/2016  MRN:  [id number]  BIRTHDAY: [birth year]  Facility:  Nursing Home Location:  [facility] Room Number: 110  LEVEL OF CARE:  SNF (31)  Contact Information    Name Relation Home Work [place] Other [phone]  [phone]       Code Status History    Date Active Date Inactive Code Status Order ID Comments User Context   08/13/2014  4:37 PM 08/14/2014  4:12 PM Full Code [id number]  Deboraha Sprang, MD Inpatient   08/10/2014  8:04 PM 08/13/2014  4:37 PM Full Code [id number]  Lavina Hamman, MD ED   01/25/2012  2:36 PM 01/28/2012  5:42 PM Full Code [id number]  Silk, Steva Colder, RN Inpatient       Chief Complaint  Patient presents with  . Acute Visit    Elevated triglycerides    HISTORY OF PRESENT ILLNESS: This is an 83 year old female who is being seen for an acute visit. She is a resident of [facility]. Lipid panel noted to be abnormal- cholesterol 203 (elevated), triglycerides 236 (elevated). She was seen in her room today. She has PMH of hypothyroidism, osteoporosis, arthritis ans PAF.    PAST MEDICAL HISTORY:  Past Medical History:  Diagnosis Date  . Arthritis   . First degree AV block   . Hypothyroidism   . Hypothyroidism    left thyroid  . Lichen sclerosus   . OA (osteoarthritis)    hands  . Osteoporosis   . Paroxysmal atrial fibrillation (HCC)   . Sick sinus syndrome ([place])   . Vitamin D deficiency      CURRENT MEDICATIONS: Reviewed  Patient's Medications  New Prescriptions   No medications on file  Previous Medications   ACETAMINOPHEN (TYLENOL) 500 MG TABLET    Take 500 mg by mouth every 6 (six) hours as needed (PAIN).    ALENDRONATE (FOSAMAX) 70 MG TABLET    Take 70 mg by mouth once a week. Take with a full glass of water on an empty stomach.   APIXABAN (ELIQUIS) 5 MG TABS TABLET    Take 1 tablet (5 mg total) by mouth 2 (two) times daily.   APOAEQUORIN (PREVAGEN) 10 MG CAPS    Take 1 capsule by  mouth daily.   CALCIUM-MAGNESIUM-VITAMIN D (CALCIUM 1200+D3 PO)    Take 1 tablet by mouth daily.   CETIRIZINE (ZYRTEC) 10 MG TABLET    Take 10 mg by mouth at bedtime.    CLOBETASOL CREAM (TEMOVATE) 0.05 %    Apply 1 application topically 2 (two) times daily as needed.    FENOFIBRATE 160 MG TABLET    Take 160 mg by mouth daily.   LEVOTHYROXINE (SYNTHROID, LEVOTHROID) 88 MCG TABLET    Take 88 mcg by mouth daily before breakfast.   LUTEIN-ZEAXANTHIN 20-1 MG CAPS    Take 1 tablet by mouth daily.   MAGNESIUM 500 MG CAPS    Take 1 capsule by mouth daily.   MEMANTINE HCL-DONEPEZIL HCL (NAMZARIC) 21-10 MG CP24    Take 1 tablet by mouth daily.   MOMETASONE (ELOCON) 0.1 % CREAM    Apply 1 application topically as needed.    NUTRITIONAL SUPPLEMENTS (ESTROVEN PO)    Take 400 mcg by mouth daily.   POLYETHYL GLYCOL-PROPYL GLYCOL (SYSTANE) 0.4-0.3 % SOLN    Apply 1 drop to eye 2 (two) times daily. For both eyes.   POLYETHYLENE GLYCOL (MIRALAX / GLYCOLAX) PACKET  Take 17 g by mouth daily.   TRIAMCINOLONE CREAM (KENALOG) 0.1 %    Apply 1 application topically 2 (two) times daily as needed. Apply to hands and legs  Modified Medications   No medications on file  Discontinued Medications   No medications on file     Allergies  Allergen Reactions  . Augmentin [Amoxicillin-Pot Clavulanate] Nausea And Vomiting  . Quinine Derivatives Other (See Comments)    Pt states "im just allergic" unknown reaction  . Penicillins Rash     REVIEW OF SYSTEMS:  GENERAL: no change in appetite, no fatigue, no weight changes, no fever, chills or weakness MOUTH and THROAT: Denies oral discomfort, gingival pain or bleeding  RESPIRATORY: no cough, SOB, DOE, wheezing, hemoptysis CARDIAC: no chest pain, edema or palpitations GI: no abdominal pain, diarrhea, constipation, heart burn, nausea or vomiting GU: Denies dysuria, frequency, hematuria, incontinence, or discharge PSYCHIATRIC: Denies feeling of depression or anxiety.  No report of hallucinations, insomnia, paranoia, or agitation     PHYSICAL EXAMINATION  GENERAL APPEARANCE: Well nourished. In no acute distress. Normal body habitus SKIN:  Skin is warm and dry.  MOUTH and THROAT: Lips are without lesions. Oral mucosa is moist and without lesions. RESPIRATORY: breathing is even & unlabored, BS CTAB CARDIAC: RRR, no murmur,no extra heart sounds, no edema GI: abdomen soft, normal BS, no masses, no tenderness EXTREMITIES:  Able to move X 4 extremities PSYCHIATRIC: Alert to self, disoriented to time and place. Affect and behavior are appropriate    LABS/RADIOLOGY: Labs reviewed: Basic Metabolic Panel:  Recent Labs  07/14/16 1626 11/17/16  NA 137 141  K 5.0 4.0  CL 101  --   CO2 25  --   GLUCOSE 182*  --   BUN 24 13  CREATININE 0.69 0.8  CALCIUM 11.7*  --    Liver Function Tests:  Recent Labs  11/17/16  AST 17  ALT 15  ALKPHOS 56   CBC:  Recent Labs  07/14/16 1626  WBC 10.3  NEUTROABS 9.2*  HGB 14.2  HCT 43.5  MCV 94  PLT 203    Lipid Panel:  Recent Labs  11/17/16  HDL 39       ASSESSMENT/PLAN:   Hypertriglyceridemia - triglycerides 236, elevated, start Fenofibrate 160 mg daily Lab Results  Component Value Date   CHOL 203 (A) 11/17/2016   HDL 39 11/17/2016   LDLCALC 126 11/17/2016   TRIG 236 (A) 11/17/2016      Prabhnoor Ellenberger C. [place]- NP [employer] [phone]

## 2016-11-30 NOTE — Progress Notes (Signed)
This encounter was created in error - please disregard.

## 2016-11-30 NOTE — Addendum Note (Signed)
Addended by: Elijah Birk L on: 11/30/2016 12:59 PM   Modules accepted: Level of Service, SmartSet

## 2016-12-11 ENCOUNTER — Encounter: Payer: Self-pay | Admitting: Nurse Practitioner

## 2016-12-11 ENCOUNTER — Non-Acute Institutional Stay (SKILLED_NURSING_FACILITY): Payer: Medicare Other | Admitting: Nurse Practitioner

## 2016-12-11 DIAGNOSIS — K5900 Constipation, unspecified: Secondary | ICD-10-CM | POA: Diagnosis not present

## 2016-12-11 DIAGNOSIS — M810 Age-related osteoporosis without current pathological fracture: Secondary | ICD-10-CM

## 2016-12-11 DIAGNOSIS — G301 Alzheimer's disease with late onset: Secondary | ICD-10-CM | POA: Diagnosis not present

## 2016-12-11 DIAGNOSIS — F0280 Dementia in other diseases classified elsewhere without behavioral disturbance: Secondary | ICD-10-CM

## 2016-12-11 DIAGNOSIS — I4891 Unspecified atrial fibrillation: Secondary | ICD-10-CM

## 2016-12-11 NOTE — Assessment & Plan Note (Signed)
Continue Fosamax, the patient has noted elevated Ca 10.4/ionized 6.1, will decrease Ca 600/1200mg  qd, change Vit D to 1000mg  qd. Repeat Ca level in 2 weeks.

## 2016-12-11 NOTE — Assessment & Plan Note (Signed)
heart rate is in control, taking Eliquis 5mg  bid.

## 2016-12-11 NOTE — Assessment & Plan Note (Signed)
Stable, continue MiraLax daily.  

## 2016-12-11 NOTE — Progress Notes (Signed)
Location:  [facility] Room Number: 110 Place of Service:  SNF [phone]) Provider:  Trinady Milewski, Manxie  NP  Blanchie Serve, MD  Patient Care Team: Blanchie Serve, MD as PCP - General (Internal Medicine)  Extended Emergency Contact Information Primary Emergency Contact: [name],[name] Address: [address] [place] [address] [name] of [place] Phone: [phone] Mobile Phone: [phone] Relation: Other  Code Status:  DNR Goals of care: Advanced Directive information Advanced Directives 12/11/2016  Does Patient Have a Medical Advance Directive? Yes  Type of Advance Directive Out of facility DNR (pink MOST or yellow form)  Does patient want to make changes to medical advance directive? No - Patient declined  Copy of [place] in Chart? -  Would patient like information on creating a medical advance directive? -  Pre-existing out of facility DNR order (yellow form or pink MOST form) Yellow form placed in chart (order not valid for inpatient use)     Chief Complaint  Patient presents with  . Medical Management of Chronic Issues    HPI:  Pt is a 83 y.o. female seen today for medical management of chronic diseases.     Hx of osteoporosis, taking Fosamax, Vit D, Calcium. Recent Calcium level has mildly elevated, the patient denied excessive thirst, frequent urination, nausea, vomiting, constipation, bone pain, muscle weakness, mood changes, lethargy, fatigue, or palpitation.   She has history of constipation, takes MiraLax daily, Dementia, she is independent transfer/ambulation, continent of bowel and bladder, takes Memantine and Donepezil for momory, resides in Memory care unit. Afib, heart rate is in control, taking Eliquis 5mg  bid.     Past Medical History:  Diagnosis Date  . Arthritis   . First degree AV block   . Hypothyroidism   . Hypothyroidism    left thyroid  . Lichen sclerosus   . OA  (osteoarthritis)    hands  . Osteoporosis   . Paroxysmal atrial fibrillation (HCC)   . Sick sinus syndrome ([place])   . Vitamin D deficiency    Past Surgical History:  Procedure Laterality Date  . ABDOMINAL HYSTERECTOMY  1974  . APPENDECTOMY  1951  . EP IMPLANTABLE DEVICE N/A 08/13/2014   Procedure: Pacemaker Implant;  Surgeon: Deboraha Sprang, MD;  Location: [facility];  Service: Cardiovascular;  Laterality: N/A;  . HAND SURGERY  20years ago   open dislocation  . TONSILLECTOMY  1942   x2  . TOTAL KNEE ARTHROPLASTY  01/25/2012   Procedure: TOTAL KNEE ARTHROPLASTY;  Surgeon: Gearlean Alf, MD;  Location: [facility];  Service: Orthopedics;  Laterality: Right;    Allergies  Allergen Reactions  . Augmentin [Amoxicillin-Pot Clavulanate] Nausea And Vomiting  . Quinine Derivatives Other (See Comments)    Pt states "im just allergic" unknown reaction  . Penicillins Rash    Outpatient Encounter Prescriptions as of 12/11/2016  Medication Sig  . acetaminophen (TYLENOL) 500 MG tablet Take 500 mg by mouth every 6 (six) hours as needed (PAIN).   Marland Kitchen alendronate (FOSAMAX) 70 MG tablet Take 70 mg by mouth once a week. Take with a full glass of water on an empty stomach.  Marland Kitchen apixaban (ELIQUIS) 5 MG TABS tablet Take 1 tablet (5 mg total) by mouth 2 (two) times daily.  Marland Kitchen Apoaequorin (PREVAGEN) 10 MG CAPS Take 1 capsule by mouth daily.  . Calcium-Magnesium-Vitamin D (CALCIUM 1200+D3 PO) Take 1 tablet by mouth daily.  Marland Kitchen  cetirizine (ZYRTEC) 10 MG tablet Take 10 mg by mouth at bedtime.   . clobetasol cream (TEMOVATE) 1.93 % Apply 1 application topically 2 (two) times daily as needed.   . fenofibrate 160 MG tablet Take 160 mg by mouth daily.  Marland Kitchen levothyroxine (SYNTHROID, LEVOTHROID) 88 MCG tablet Take 88 mcg by mouth daily before breakfast.  . Lutein-Zeaxanthin 20-1 MG CAPS Take 1 tablet by mouth daily.  . Magnesium 500 MG CAPS Take 1 capsule by mouth daily.  . Memantine HCl-Donepezil HCl (NAMZARIC)  21-10 MG CP24 Take 1 tablet by mouth daily.  . mometasone (ELOCON) 0.1 % cream Apply 1 application topically as needed.   . Nutritional Supplements (ESTROVEN PO) Take 400 mcg by mouth daily.  Vladimir Faster Glycol-Propyl Glycol (SYSTANE) 0.4-0.3 % SOLN Apply 1 drop to eye 2 (two) times daily. For both eyes.  . polyethylene glycol (MIRALAX / GLYCOLAX) packet Take 17 g by mouth daily.  Marland Kitchen triamcinolone cream (KENALOG) 0.1 % Apply 1 application topically 2 (two) times daily as needed. Apply to hands and legs   No facility-administered encounter medications on file as of 12/11/2016.    ROS is provided with assistance of staff.  Review of Systems  Constitutional: Negative for activity change, appetite change, chills, diaphoresis, fatigue and fever.  HENT: Negative for congestion, trouble swallowing and voice change.   Eyes: Negative for visual disturbance.  Respiratory: Negative for cough, choking, chest tightness, shortness of breath and wheezing.   Cardiovascular: Negative for chest pain, palpitations and leg swelling.  Gastrointestinal: Negative for abdominal pain, constipation, diarrhea, nausea and vomiting.  Endocrine: Negative for cold intolerance and polydipsia.  Genitourinary: Negative for difficulty urinating, dysuria, frequency and urgency.  Musculoskeletal: Negative for arthralgias, back pain, gait problem and myalgias.  Skin: Positive for rash.       Chronic rash, f/u dermatology  Neurological: Negative for dizziness, speech difficulty, weakness and headaches.  Psychiatric/Behavioral: Positive for confusion. Negative for agitation, behavioral problems, hallucinations and sleep disturbance.    Immunization History  Administered Date(s) Administered  . Influenza-Unspecified 12/04/2015  . PPD Test 08/12/2015, 09/02/2015, 09/04/2015  . Tdap 08/10/2014   Pertinent  Health Maintenance Due  Topic Date Due  . INFLUENZA VACCINE  09/16/2016  . DEXA SCAN  Completed  . PNA vac Low Risk  Adult  Completed   Fall Risk  10/16/2016  Falls in the past year? No   Functional Status Survey:    Vitals:   12/11/16 1257  BP: 128/80  Pulse: 64  Resp: 20  Temp: 97.9 F (36.6 C)  Weight: 165 lb 3.2 oz (74.9 kg)  Height: 5\' 7"  (1.702 m)   Body mass index is 25.87 kg/m. Physical Exam  Constitutional: She appears well-developed and well-nourished.  HENT:  Head: Normocephalic and atraumatic.  Eyes: Pupils are equal, round, and reactive to light. Conjunctivae and EOM are normal.  Neck: Normal range of motion. Neck supple.  Cardiovascular: Normal rate, regular rhythm and normal heart sounds.   No murmur heard. Pulmonary/Chest: Effort normal and breath sounds normal. She has no wheezes. She has no rales.  Abdominal: Soft. Bowel sounds are normal. She exhibits no distension. There is no tenderness. There is no rebound and no guarding.  Musculoskeletal: Normal range of motion. She exhibits no edema or tenderness.  Neurological: She is alert. She exhibits normal muscle tone. Coordination normal.  Oriented to person   Skin: Skin is warm and dry. Rash noted.  Psychiatric: She has a normal mood and affect. Her behavior  is normal.    Labs reviewed:  Recent Labs  07/14/16 1626 11/17/16  NA 137 141  K 5.0 4.0  CL 101  --   CO2 25  --   GLUCOSE 182*  --   BUN 24 13  CREATININE 0.69 0.8  CALCIUM 11.7*  --     Recent Labs  11/17/16  AST 17  ALT 15  ALKPHOS 56    Recent Labs  07/14/16 1626  WBC 10.3  NEUTROABS 9.2*  HGB 14.2  HCT 43.5  MCV 94  PLT 203   Lab Results  Component Value Date   TSH 1.76 08/20/2016   No results found for: HGBA1C Lab Results  Component Value Date   CHOL 203 (A) 11/17/2016   HDL 39 11/17/2016   LDLCALC 126 11/17/2016   TRIG 236 (A) 11/17/2016    Significant Diagnostic Results in last 30 days:  No results found.  Assessment/Plan Hypercalcemia 10/3/318 serum Ca 10.4(8.6-10.4), Ionized Ca 6.1(4.8-5.6), the patient denied  excessive thirst, frequent urination, nausea, vomiting, constipation, bone pain, muscle weakness, mood changes, lethargy, fatigue, or palpitation. Possible etiologies: overactive parathyroid glands, cancer, taking too much Ca, Vit D. Vit D stimulates intestinal Ca and Po4 absorption, stimulates bone mineralization. The patient also takes Fosamax which inhibits osteoclast activity, reducing bone resorption and turnover. Will reduce Ca 600mg  qd, Vit D 1000u/day from Ca 600/Vit D 400u II po daily, repeat Ca level in 2 weeks, if Ca level is trending down but is still over the high end of normal range, then continue to decrease Ca. If Ca level has no change, then may consider decrease Vit D. May need to obtain parathyroid hormone level to evaluate further.   Osteoporosis without current pathological fracture Continue Fosamax, the patient has noted elevated Ca 10.4/ionized 6.1, will decrease Ca 600/1200mg  qd, change Vit D to 1000mg  qd. Repeat Ca level in 2 weeks.   Atrial fibrillation with RVR heart rate is in control, taking Eliquis 5mg  bid.   Late onset Alzheimer's disease without behavioral disturbance she is independent transfer/ambulation, continent of bowel and bladder, continue  Memantine and Donepezil for momory, resides in Memory care unit.  Constipation Stable, continue MiraLax daily.      Family/ staff Communication: plan of care reviewed with the patient and charge nurse.   Labs/tests ordered:  BMP, Calcium Ionized level in 2 weeks.   Time spend 25 minutes.

## 2016-12-11 NOTE — Assessment & Plan Note (Signed)
10/3/318 serum Ca 10.4(8.6-10.4), Ionized Ca 6.1(4.8-5.6), the patient denied excessive thirst, frequent urination, nausea, vomiting, constipation, bone pain, muscle weakness, mood changes, lethargy, fatigue, or palpitation. Possible etiologies: overactive parathyroid glands, cancer, taking too much Ca, Vit D. Vit D stimulates intestinal Ca and Po4 absorption, stimulates bone mineralization. The patient also takes Fosamax which inhibits osteoclast activity, reducing bone resorption and turnover. Will reduce Ca 600mg  qd, Vit D 1000u/day from Ca 600/Vit D 400u II po daily, repeat Ca level in 2 weeks, if Ca level is trending down but is still over the high end of normal range, then continue to decrease Ca. If Ca level has no change, then may consider decrease Vit D. May need to obtain parathyroid hormone level to evaluate further.

## 2016-12-11 NOTE — Assessment & Plan Note (Signed)
she is independent transfer/ambulation, continent of bowel and bladder, continue  Memantine and Donepezil for momory, resides in Memory care unit.

## 2016-12-22 LAB — BASIC METABOLIC PANEL
BUN: 16 (ref 4–21)
CREATININE: 0.8 (ref <=1.1)
Glucose: 119
Potassium: 4.3 (ref 3.4–5.3)
Sodium: 140 (ref 137–147)

## 2016-12-24 ENCOUNTER — Other Ambulatory Visit: Payer: Self-pay | Admitting: *Deleted

## 2017-01-15 ENCOUNTER — Encounter: Payer: Self-pay | Admitting: Internal Medicine

## 2017-01-15 ENCOUNTER — Non-Acute Institutional Stay (SKILLED_NURSING_FACILITY): Payer: Medicare Other | Admitting: Internal Medicine

## 2017-01-15 DIAGNOSIS — R634 Abnormal weight loss: Secondary | ICD-10-CM | POA: Diagnosis not present

## 2017-01-15 DIAGNOSIS — E039 Hypothyroidism, unspecified: Secondary | ICD-10-CM

## 2017-01-15 DIAGNOSIS — I480 Paroxysmal atrial fibrillation: Secondary | ICD-10-CM

## 2017-01-15 DIAGNOSIS — J309 Allergic rhinitis, unspecified: Secondary | ICD-10-CM | POA: Diagnosis not present

## 2017-01-15 NOTE — Progress Notes (Signed)
Location:  [facility] Room Number: 110 Place of Service:  SNF [phone]) Provider:  Blanchie Serve MD  Blanchie Serve, MD  Patient Care Team: Blanchie Serve, MD as PCP - General (Internal Medicine)  Extended Emergency Contact Information Primary Emergency Contact: [name],[name] Address: [address] [place] [address] [name] of [place] Phone: [phone] Mobile Phone: [phone] Relation: Other  Code Status:  DNR  Goals of care: Advanced Directive information Advanced Directives 12/11/2016  Does Patient Have a Medical Advance Directive? Yes  Type of Advance Directive Out of facility DNR (pink MOST or yellow form)  Does patient want to make changes to medical advance directive? No - Patient declined  Copy of [place] in Chart? -  Would patient like information on creating a medical advance directive? -  Pre-existing out of facility DNR order (yellow form or pink MOST form) Yellow form placed in chart (order not valid for inpatient use)     Chief Complaint  Patient presents with  . Medical Management of Chronic Issues    HPI:  Pt is a 83 y.o. female seen today for medical management of chronic diseases. She has limited participation in HPI and ROS. She ambulates without any assistive device. No fall reported. No acute behavior changes. She has occasional rash to her arms and legs and responds well to topical cream. She feeds herself. She is alert and oriented only to self. She is continent with her bowel and bladder. Her appetite is good.    Past Medical History:  Diagnosis Date  . Arthritis   . First degree AV block   . Hypothyroidism   . Hypothyroidism    left thyroid  . Lichen sclerosus   . OA (osteoarthritis)    hands  . Osteoporosis   . Paroxysmal atrial fibrillation (HCC)   . Sick sinus syndrome ([place])   . Vitamin D deficiency    Past Surgical History:  Procedure Laterality Date  .  ABDOMINAL HYSTERECTOMY  1974  . APPENDECTOMY  1951  . EP IMPLANTABLE DEVICE N/A 08/13/2014   Procedure: Pacemaker Implant;  Surgeon: Deboraha Sprang, MD;  Location: [facility];  Service: Cardiovascular;  Laterality: N/A;  . HAND SURGERY  20years ago   open dislocation  . TONSILLECTOMY  1942   x2  . TOTAL KNEE ARTHROPLASTY  01/25/2012   Procedure: TOTAL KNEE ARTHROPLASTY;  Surgeon: Gearlean Alf, MD;  Location: [facility];  Service: Orthopedics;  Laterality: Right;    Allergies  Allergen Reactions  . Augmentin [Amoxicillin-Pot Clavulanate] Nausea And Vomiting  . Quinine Derivatives Other (See Comments)    Pt states "im just allergic" unknown reaction  . Penicillins Rash    Outpatient Encounter Medications as of 01/15/2017  Medication Sig  . acetaminophen (TYLENOL) 500 MG tablet Take 500 mg by mouth every 6 (six) hours as needed (PAIN).   Marland Kitchen apixaban (ELIQUIS) 5 MG TABS tablet Take 1 tablet (5 mg total) by mouth 2 (two) times daily.  Marland Kitchen Apoaequorin (PREVAGEN) 10 MG CAPS Take 1 capsule by mouth daily.  . Calcium-Magnesium-Vitamin D (CALCIUM 1200+D3 PO) Take 1 tablet by mouth daily.  . cetirizine (ZYRTEC) 10 MG tablet Take 10 mg by mouth at bedtime.   . clobetasol cream (TEMOVATE) 1.66 % Apply 1 application topically 2 (two) times daily as needed.   . fenofibrate 160 MG tablet Take 160 mg by mouth daily.  Marland Kitchen  levothyroxine (SYNTHROID, LEVOTHROID) 88 MCG tablet Take 88 mcg by mouth daily before breakfast.  . Lutein-Zeaxanthin 20-1 MG CAPS Take 1 tablet by mouth daily.  . Magnesium 500 MG CAPS Take 1 capsule by mouth daily.  . Memantine HCl-Donepezil HCl (NAMZARIC) 21-10 MG CP24 Take 1 tablet by mouth daily.  . mometasone (ELOCON) 0.1 % cream Apply 1 application topically as needed.   . Nutritional Supplements (ESTROVEN PO) Take 400 mcg by mouth daily.  Vladimir Faster Glycol-Propyl Glycol (SYSTANE) 0.4-0.3 % SOLN Apply 1 drop to eye 2 (two) times daily. For both eyes.  . polyethylene glycol  (MIRALAX / GLYCOLAX) packet Take 17 g by mouth daily.  Marland Kitchen triamcinolone cream (KENALOG) 0.1 % Apply 1 application topically 2 (two) times daily as needed. Apply to hands and legs  . [DISCONTINUED] alendronate (FOSAMAX) 70 MG tablet Take 70 mg by mouth once a week. Take with a full glass of water on an empty stomach.   No facility-administered encounter medications on file as of 01/15/2017.     Review of Systems  Constitutional: Negative for appetite change, chills and fever.  HENT: Negative for congestion, hearing loss, mouth sores and rhinorrhea.   Respiratory: Negative for cough and shortness of breath.   Cardiovascular: Negative for chest pain and palpitations.  Gastrointestinal: Negative for abdominal pain, constipation, diarrhea, nausea and vomiting.  Genitourinary: Negative for dysuria.  Musculoskeletal: Negative for back pain and gait problem.  Skin: Negative for rash and wound.  Neurological: Negative for dizziness and headaches.  Psychiatric/Behavioral: Positive for confusion.    Immunization History  Administered Date(s) Administered  . Influenza-Unspecified 12/04/2015  . PPD Test 08/12/2015, 09/02/2015, 09/04/2015  . Tdap 08/10/2014   Pertinent  Health Maintenance Due  Topic Date Due  . INFLUENZA VACCINE  09/16/2016  . DEXA SCAN  Completed  . PNA vac Low Risk Adult  Completed   Fall Risk  10/16/2016  Falls in the past year? No   Functional Status Survey:    Vitals:   01/15/17 1509  BP: 122/80  Pulse: 86  Resp: 20  Temp: 97.9 F (36.6 C)  Weight: 155 lb 9.6 oz (70.6 kg)  Height: 5\' 7"  (1.702 m)   Body mass index is 24.37 kg/m.   Wt Readings from Last 3 Encounters:  01/15/17 155 lb 9.6 oz (70.6 kg)  12/11/16 165 lb 3.2 oz (74.9 kg)  11/19/16 165 lb (74.8 kg)   Physical Exam  Constitutional: She appears well-developed and well-nourished. No distress.  HENT:  Head: Normocephalic and atraumatic.  Mouth/Throat: Oropharynx is clear and moist. No  oropharyngeal exudate.  Eyes: Conjunctivae and EOM are normal. Pupils are equal, round, and reactive to light. Right eye exhibits no discharge. Left eye exhibits no discharge.  Neck: Neck supple.  Cardiovascular: Normal rate and regular rhythm.  Pulmonary/Chest: Effort normal and breath sounds normal. No respiratory distress. She has no wheezes. She has no rales.  Abdominal: Soft. Bowel sounds are normal. There is no tenderness. There is no guarding.  Musculoskeletal: She exhibits no edema.  Lymphadenopathy:    She has no cervical adenopathy.  Neurological: She is alert.  Pleasantly confused, oriented only to self  Skin: Skin is warm and dry. She is not diaphoretic.  Eczema to RLE and few erythematous patches to her back. Has history of lichen sclerosis  Psychiatric: She has a normal mood and affect.    Labs reviewed: Recent Labs    07/14/16 1626 11/17/16 12/22/16  NA 137 141 140  K  5.0 4.0 4.3  CL 101  --   --   CO2 25  --   --   GLUCOSE 182*  --   --   BUN 24 13 16   CREATININE 0.69 0.8 0.8  CALCIUM 11.7*  --   --    Recent Labs    11/17/16  AST 17  ALT 15  ALKPHOS 56   Recent Labs    07/14/16 1626  WBC 10.3  NEUTROABS 9.2*  HGB 14.2  HCT 43.5  MCV 94  PLT 203   Lab Results  Component Value Date   TSH 1.76 08/20/2016   No results found for: HGBA1C Lab Results  Component Value Date   CHOL 203 (A) 11/17/2016   HDL 39 11/17/2016   LDLCALC 126 11/17/2016   TRIG 236 (A) 11/17/2016    Significant Diagnostic Results in last 30 days:  No results found.  Assessment/Plan  Hypothyroidism Lab Results  Component Value Date   TSH 1.76 08/20/2016   Stable, continue levothyroxine and check TSH every 6 months.  Weight loss 10 lb weight difference since last visit. Per nursing she is eating good. No signs of fluid overload on exam. Recheck weight to make sure this difference is adequate. If correct, will obtain prealbumin level.   Allergic rhinitis Stable  symptom. Continue zyrtec for now.   afib Controlled HR. Continue eliquis for stroke prevention    Family/ staff Communication: reviewed care plan with patient and charge nurse.    Labs/tests ordered:  none   Blanchie Serve, MD Internal Medicine [facility] [address] [place], [place] [address] Cell Phone (Monday-Friday 8 am - 5 pm): [phone] On Call: [phone] and follow prompts after 5 pm and on weekends Office Phone: [phone] Office Fax: [phone]

## 2017-01-16 IMAGING — NM NM MYOCAR MULTI W/ SPECT
3 series · 18 of 18 positions shown · non-contrast
Comparison: none

[Series 1: stress_(id)_sa · 6.5mm · 6.51mm/px · 6 of 512 frames shown (1 of 2)]
[frame 43/512]
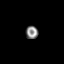
[frame 128/512]
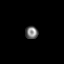
[frame 214/512]
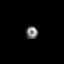
[frame 299/512]
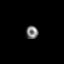
[frame 384/512]
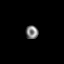
[frame 470/512]
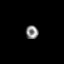

[Series 1: rest_(id)_sa · 6.5mm · 6.51mm/px · 6 of 64 frames shown]
[frame 6/64]
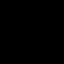
[frame 16/64]
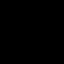
[frame 27/64]
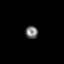
[frame 38/64]
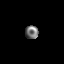
[frame 48/64]
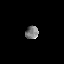
[frame 59/64]
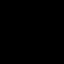

[Series 1: stress_(id)_sa · 6.5mm · 6.51mm/px · 6 of 64 frames shown (2 of 2)]
[frame 6/64]
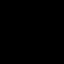
[frame 16/64]
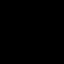
[frame 27/64]
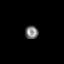
[frame 38/64]
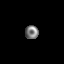
[frame 48/64]
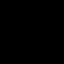
[frame 59/64]
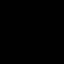

[18 of 18 positions shown; findings below may reference images not displayed]

Canned report from images found in remote index.

Refer to host system for actual result text.

## 2017-01-18 ENCOUNTER — Non-Acute Institutional Stay (SKILLED_NURSING_FACILITY): Payer: Medicare Other | Admitting: Nurse Practitioner

## 2017-01-18 ENCOUNTER — Encounter: Payer: Self-pay | Admitting: Nurse Practitioner

## 2017-01-18 DIAGNOSIS — R634 Abnormal weight loss: Secondary | ICD-10-CM

## 2017-01-18 DIAGNOSIS — F0280 Dementia in other diseases classified elsewhere without behavioral disturbance: Secondary | ICD-10-CM

## 2017-01-18 DIAGNOSIS — G301 Alzheimer's disease with late onset: Secondary | ICD-10-CM | POA: Diagnosis not present

## 2017-01-18 DIAGNOSIS — E039 Hypothyroidism, unspecified: Secondary | ICD-10-CM | POA: Diagnosis not present

## 2017-01-18 NOTE — Assessment & Plan Note (Addendum)
TSH 1.76 08/20/16, continue Levothyroxine 22mcg, update TSH

## 2017-01-18 NOTE — Assessment & Plan Note (Signed)
weight loss, weights # 9Ibs loss in a month, #161Ibs 12/17/16, #152 Ibs 01/16/17. She weighed # 160s prior. Staff reported the patient eats less recently, she eats a bowl of cereal for breakfast, a few bits only for lunch and dinner. Recent medication changes: 11/19/16 Fenofibrate 160mg  po qd, 12/24/16 off Ca, VitD, 01/08/17 hold Alendronate and pending serum Ca and Ionized Ca due to hypercalcemia. Will update CBC TSH Lipid panel. Weight weekly x 4, observe, may consider Mirtazapine 7.5mg  qhs if weight loss persists.

## 2017-01-18 NOTE — Progress Notes (Signed)
Location:   [facility] Room Number: 110 Place of Service:  SNF (31) Provider: Lennie Odor Mast NP  Blanchie Serve, MD  Patient Care Team: Blanchie Serve, MD as PCP - General (Internal Medicine)  Extended Emergency Contact Information Primary Emergency Contact: [name],[name] Address: [address] [place] [address] [name] of [place] Phone: [phone] Mobile Phone: [phone] Relation: Other  Code Status: DNR Goals of care: Advanced Directive information Advanced Directives 01/18/2017  Does Patient Have a Medical Advance Directive? Yes  Type of Advance Directive Out of facility DNR (pink MOST or yellow form)  Does patient want to make changes to medical advance directive? No - Patient declined  Copy of [place] in Chart? -  Would patient like information on creating a medical advance directive? -  Pre-existing out of facility DNR order (yellow form or pink MOST form) Yellow form placed in chart (order not valid for inpatient use)     Chief Complaint  Patient presents with  . Acute Visit    weight loss    HPI:  Pt is a 83 y.o. female seen today for an acute visit for weight loss, weights # 9Ibs loss in a month, #161Ibs 12/17/16, #152 Ibs 01/16/17. She weighed # 165Ibs 03/2016 and 11/2016.  Staff reported the patient eats less recently, she eats a bowl of cereal for breakfast, a few bits only for lunch and dinner. Recent medication changes: 11/19/16 Fenofibrate 160mg  po qd, 12/24/16 off Ca, VitD, 01/08/17 hold Alendronate and pending serum Ca and Ionized Ca due to hypercalcemia. Hx of dementia, resides in [facility], takes Namzaric 21/10mg  po daily. Hx of hypothyroidism, taking Levothyroxine 74mcg daily, last TSH 1.76 08/20/16   Past Medical History:  Diagnosis Date  . Arthritis   . First degree AV block   . Hypothyroidism   . Hypothyroidism    left thyroid  . Lichen sclerosus   . OA (osteoarthritis)    hands  .  Osteoporosis   . Paroxysmal atrial fibrillation (HCC)   . Sick sinus syndrome ([place])   . Vitamin D deficiency    Past Surgical History:  Procedure Laterality Date  . ABDOMINAL HYSTERECTOMY  1974  . APPENDECTOMY  1951  . EP IMPLANTABLE DEVICE N/A 08/13/2014   Procedure: Pacemaker Implant;  Surgeon: Deboraha Sprang, MD;  Location: [facility];  Service: Cardiovascular;  Laterality: N/A;  . HAND SURGERY  20years ago   open dislocation  . TONSILLECTOMY  1942   x2  . TOTAL KNEE ARTHROPLASTY  01/25/2012   Procedure: TOTAL KNEE ARTHROPLASTY;  Surgeon: Gearlean Alf, MD;  Location: [facility];  Service: Orthopedics;  Laterality: Right;    Allergies  Allergen Reactions  . Augmentin [Amoxicillin-Pot Clavulanate] Nausea And Vomiting  . Quinine Derivatives Other (See Comments)    Pt states "im just allergic" unknown reaction  . Penicillins Rash    Allergies as of 01/18/2017      Reactions   Augmentin [amoxicillin-pot Clavulanate] Nausea And Vomiting   Quinine Derivatives Other (See Comments)   Pt states "im just allergic" unknown reaction   Penicillins Rash      Medication List        Accurate as of 01/18/17  2:58 PM. Always use your most recent med list.          acetaminophen 500 MG tablet Commonly known as:  TYLENOL Take 500 mg by mouth every 6 (  six) hours as needed (PAIN).   apixaban 5 MG Tabs tablet Commonly known as:  ELIQUIS Take 1 tablet (5 mg total) by mouth 2 (two) times daily.   CALCIUM 1200+D3 PO Take 1 tablet by mouth daily.   cetirizine 10 MG tablet Commonly known as:  ZYRTEC Take 10 mg by mouth at bedtime.   clobetasol cream 0.05 % Commonly known as:  TEMOVATE Apply 1 application topically 2 (two) times daily as needed.   ESTROVEN PO Take 400 mcg by mouth daily.   fenofibrate 160 MG tablet Take 160 mg by mouth daily.   levothyroxine 88 MCG tablet Commonly known as:  SYNTHROID, LEVOTHROID Take 88 mcg by mouth daily before breakfast.     Lutein-Zeaxanthin 20-1 MG Caps Take 1 tablet by mouth daily.   Magnesium 500 MG Caps Take 1 capsule by mouth daily.   mometasone 0.1 % cream Commonly known as:  ELOCON Apply 1 application topically as needed.   NAMZARIC 21-10 MG Cp24 Generic drug:  Memantine HCl-Donepezil HCl Take 1 tablet by mouth daily.   polyethylene glycol packet Commonly known as:  MIRALAX / GLYCOLAX Take 17 g by mouth daily.   PREVAGEN 10 MG Caps Generic drug:  Apoaequorin Take 1 capsule by mouth daily.   SYSTANE 0.4-0.3 % Soln Generic drug:  Polyethyl Glycol-Propyl Glycol Apply 1 drop to eye 2 (two) times daily. For both eyes.   triamcinolone cream 0.1 % Commonly known as:  KENALOG Apply 1 application topically 2 (two) times daily as needed. Apply to hands and legs      ROS was provided with assistance of staff Review of Systems  Constitutional: Positive for unexpected weight change. Negative for activity change, appetite change, chills, diaphoresis, fatigue and fever.  HENT: Positive for hearing loss. Negative for congestion, trouble swallowing and voice change.   Eyes: Negative for visual disturbance.  Respiratory: Negative for cough, choking, chest tightness, shortness of breath and wheezing.   Cardiovascular: Negative for chest pain, palpitations and leg swelling.  Gastrointestinal: Negative for abdominal distention, abdominal pain, blood in stool, constipation, diarrhea and vomiting.  Endocrine: Negative for cold intolerance.  Genitourinary: Negative for difficulty urinating, dysuria and urgency.  Musculoskeletal: Positive for gait problem.  Skin: Negative for pallor and wound.  Neurological: Negative for tremors, speech difficulty, weakness and headaches.       Dementia  Psychiatric/Behavioral: Positive for confusion. Negative for agitation, behavioral problems and self-injury. The patient is not nervous/anxious.     Immunization History  Administered Date(s) Administered  .  Influenza-Unspecified 12/04/2015  . PPD Test 08/12/2015, 09/02/2015, 09/04/2015  . Tdap 08/10/2014   Pertinent  Health Maintenance Due  Topic Date Due  . INFLUENZA VACCINE  09/16/2016  . DEXA SCAN  Completed  . PNA vac Low Risk Adult  Completed   Fall Risk  10/16/2016  Falls in the past year? No   Functional Status Survey:    Vitals:   01/18/17 1413  BP: 122/80  Pulse: 86  Resp: 20  Temp: 97.9 F (36.6 C)  Weight: 152 lb 12.8 oz (69.3 kg)  Height: 5\' 7"  (1.702 m)   Body mass index is 23.93 kg/m. Physical Exam  Constitutional: She is oriented to person, place, and time. She appears well-developed and well-nourished. No distress.  HENT:  Head: Normocephalic and atraumatic.  Eyes: Conjunctivae and EOM are normal. Pupils are equal, round, and reactive to light.  Neck: Normal range of motion. Neck supple. No JVD present. No thyromegaly present.  Cardiovascular: Normal  rate and normal heart sounds.  No murmur heard. Irregular heart beats.   Pulmonary/Chest: Effort normal and breath sounds normal. She has no wheezes. She has no rales.  Abdominal: Soft. Bowel sounds are normal. She exhibits no distension. There is no tenderness. There is no rebound and no guarding.  Musculoskeletal: Normal range of motion. She exhibits no edema or tenderness.  Neurological: She is alert and oriented to person, place, and time.  Oriented to self and place.   Skin: Skin is warm and dry. She is not diaphoretic. No pallor.  Psychiatric: She has a normal mood and affect. Her behavior is normal.    Labs reviewed: Recent Labs    07/14/16 1626 11/17/16 12/22/16  NA 137 141 140  K 5.0 4.0 4.3  CL 101  --   --   CO2 25  --   --   GLUCOSE 182*  --   --   BUN 24 13 16   CREATININE 0.69 0.8 0.8  CALCIUM 11.7*  --   --    Recent Labs    11/17/16  AST 17  ALT 15  ALKPHOS 56   Recent Labs    07/14/16 1626  WBC 10.3  NEUTROABS 9.2*  HGB 14.2  HCT 43.5  MCV 94  PLT 203   Lab Results    Component Value Date   TSH 1.76 08/20/2016   No results found for: HGBA1C Lab Results  Component Value Date   CHOL 203 (A) 11/17/2016   HDL 39 11/17/2016   LDLCALC 126 11/17/2016   TRIG 236 (A) 11/17/2016    Significant Diagnostic Results in last 30 days:  No results found.  Assessment/Plan: Weight loss weight loss, weights # 9Ibs loss in a month, #161Ibs 12/17/16, #152 Ibs 01/16/17. She weighed # 160s prior. Staff reported the patient eats less recently, she eats a bowl of cereal for breakfast, a few bits only for lunch and dinner. Recent medication changes: 11/19/16 Fenofibrate 160mg  po qd, 12/24/16 off Ca, VitD, 01/08/17 hold Alendronate and pending serum Ca and Ionized Ca due to hypercalcemia. Will update CBC TSH Lipid panel. Weight weekly x 4, observe, may consider Mirtazapine 7.5mg  qhs if weight loss persists.   Hypothyroidism TSH 1.76 08/20/16, continue Levothyroxine 53mcg, update TSH  Late onset Alzheimer's disease without behavioral disturbance Hx of dementia, resides in [facility], takes Namzaric 21/10mg  po daily. This may contributory to her weight loss, observe weigh weekly x 4, re-evaluate     Family/ staff Communication: plan of care reviewed with the patient and charge nurse.   Labs/tests ordered:  CBC TSH Lipid panel.   Time spend 25 minutes.

## 2017-01-18 NOTE — Assessment & Plan Note (Signed)
Hx of dementia, resides in [facility], takes Namzaric 21/10mg  po daily. This may contributory to her weight loss, observe weigh weekly x 4, re-evaluate

## 2017-01-19 ENCOUNTER — Encounter: Payer: Self-pay | Admitting: Nurse Practitioner

## 2017-01-19 DIAGNOSIS — R7989 Other specified abnormal findings of blood chemistry: Secondary | ICD-10-CM | POA: Insufficient documentation

## 2017-01-19 DIAGNOSIS — E785 Hyperlipidemia, unspecified: Secondary | ICD-10-CM | POA: Insufficient documentation

## 2017-01-19 DIAGNOSIS — R945 Abnormal results of liver function studies: Secondary | ICD-10-CM | POA: Insufficient documentation

## 2017-02-05 ENCOUNTER — Other Ambulatory Visit: Payer: Self-pay | Admitting: *Deleted

## 2017-02-05 LAB — HEPATIC FUNCTION PANEL
ALT: 79 — AB (ref 7–35)
AST: 55 — AB (ref 13–35)
Alkaline Phosphatase: 50 (ref 25–125)
Bilirubin, Direct: 0.1
Bilirubin, Direct: 0.1
Bilirubin, Total: 0.4
Bilirubin, Total: 0.4

## 2017-02-10 ENCOUNTER — Other Ambulatory Visit: Payer: Self-pay | Admitting: *Deleted

## 2017-02-12 ENCOUNTER — Encounter: Payer: Self-pay | Admitting: Nurse Practitioner

## 2017-02-12 ENCOUNTER — Non-Acute Institutional Stay (SKILLED_NURSING_FACILITY): Payer: Medicare Other | Admitting: Nurse Practitioner

## 2017-02-12 DIAGNOSIS — E039 Hypothyroidism, unspecified: Secondary | ICD-10-CM

## 2017-02-12 DIAGNOSIS — K5900 Constipation, unspecified: Secondary | ICD-10-CM | POA: Diagnosis not present

## 2017-02-12 DIAGNOSIS — G301 Alzheimer's disease with late onset: Secondary | ICD-10-CM | POA: Diagnosis not present

## 2017-02-12 DIAGNOSIS — F0280 Dementia in other diseases classified elsewhere without behavioral disturbance: Secondary | ICD-10-CM | POA: Diagnosis not present

## 2017-02-12 DIAGNOSIS — I480 Paroxysmal atrial fibrillation: Secondary | ICD-10-CM | POA: Diagnosis not present

## 2017-02-12 NOTE — Assessment & Plan Note (Signed)
Her constipation is stable, continue MiraLax daily.

## 2017-02-12 NOTE — Assessment & Plan Note (Signed)
Afib, heart rate is in control, continue Eliquis 5mg  bid for thromboembolic risk reduction.

## 2017-02-12 NOTE — Assessment & Plan Note (Signed)
history of dementia, resides in [facility], ambulates with walker independently, she is continent of urine, oriented to person and place, continue Namzaric to preserve memory.

## 2017-02-12 NOTE — Progress Notes (Signed)
Location:  [facility] Room Number: 110 Place of Service:  SNF ([phone]) Provider:  Sheza Strickland, Manxie  NP  Blanchie Serve, MD  Patient Care Team: Blanchie Serve, MD as PCP - General (Internal Medicine)  Extended Emergency Contact Information Primary Emergency Contact: [name],[name] Address: [address] [place] [address] [name] of [place] Phone: [phone] Mobile Phone: [phone] Relation: Other  Code Status:  DNR Goals of care: Advanced Directive information Advanced Directives 02/12/2017  Does Patient Have a Medical Advance Directive? Yes  Type of Advance Directive Out of facility DNR (pink MOST or yellow form)  Does patient want to make changes to medical advance directive? No - Patient declined  Copy of [place] in Chart? -  Would patient like information on creating a medical advance directive? -  Pre-existing out of facility DNR order (yellow form or pink MOST form) Yellow form placed in chart (order not valid for inpatient use)     Chief Complaint  Patient presents with  . Medical Management of Chronic Issues    HPI:  Pt is a 83 y.o. female seen today for medical management of chronic diseases.      The patient has history of dementia, resides in [facility], ambulates with walker independently, she is continent of urine, oriented to person and place, on Namzaric to preserve memory. Her constipation is managed with MiraLax daily. Hypothyroidism, on Levothyroxine 11mcg qd, last TSH 1.76 08/20/16.  Afib, heart rate is in control, on Eliquis 5mg  bid for thromboembolic risk reduction.    Past Medical History:  Diagnosis Date  . Arthritis   . First degree AV block   . Hypothyroidism   . Hypothyroidism    left thyroid  . Lichen sclerosus   . OA (osteoarthritis)    hands  . Osteoporosis   . Paroxysmal atrial fibrillation (HCC)   . Sick sinus syndrome ([place])   . Vitamin D deficiency     Past Surgical History:  Procedure Laterality Date  . ABDOMINAL HYSTERECTOMY  1974  . APPENDECTOMY  1951  . EP IMPLANTABLE DEVICE N/A 08/13/2014   Procedure: Pacemaker Implant;  Surgeon: Deboraha Sprang, MD;  Location: [facility];  Service: Cardiovascular;  Laterality: N/A;  . HAND SURGERY  20years ago   open dislocation  . TONSILLECTOMY  1942   x2  . TOTAL KNEE ARTHROPLASTY  01/25/2012   Procedure: TOTAL KNEE ARTHROPLASTY;  Surgeon: Gearlean Alf, MD;  Location: [facility];  Service: Orthopedics;  Laterality: Right;    Allergies  Allergen Reactions  . Augmentin [Amoxicillin-Pot Clavulanate] Nausea And Vomiting  . Quinine Derivatives Other (See Comments)    Pt states "im just allergic" unknown reaction  . Penicillins Rash    Outpatient Encounter Medications as of 02/12/2017  Medication Sig  . acetaminophen (TYLENOL) 500 MG tablet Take 500 mg by mouth every 6 (six) hours as needed (PAIN).   Marland Kitchen apixaban (ELIQUIS) 5 MG TABS tablet Take 1 tablet (5 mg total) by mouth 2 (two) times daily.  Marland Kitchen Apoaequorin (PREVAGEN) 10 MG CAPS Take 1 capsule by mouth daily.  . Calcium-Magnesium-Vitamin D (CALCIUM 1200+D3 PO) Take 1 tablet by mouth daily.  . cetirizine (ZYRTEC) 10 MG tablet Take 10 mg by mouth at bedtime.   . clobetasol cream (TEMOVATE) 0.62 % Apply 1 application topically 2 (two) times daily as needed.   Marland Kitchen levothyroxine (SYNTHROID, LEVOTHROID) 88 MCG  tablet Take 88 mcg by mouth daily before breakfast.  . Lutein-Zeaxanthin 20-1 MG CAPS Take 1 tablet by mouth daily.  . Magnesium 500 MG CAPS Take 1 capsule by mouth daily.  . Memantine HCl-Donepezil HCl (NAMZARIC) 21-10 MG CP24 Take 1 tablet by mouth daily.  . mometasone (ELOCON) 0.1 % cream Apply 1 application topically as needed.   Vladimir Faster Glycol-Propyl Glycol (SYSTANE) 0.4-0.3 % SOLN Apply 1 drop to eye 2 (two) times daily. For both eyes.  . polyethylene glycol (MIRALAX / GLYCOLAX) packet Take 17 g by mouth daily.  Marland Kitchen  triamcinolone cream (KENALOG) 0.1 % Apply 1 application topically 2 (two) times daily as needed. Apply to hands and legs  . [DISCONTINUED] fenofibrate 160 MG tablet Take 160 mg by mouth daily.  . [DISCONTINUED] Nutritional Supplements (ESTROVEN PO) Take 400 mcg by mouth daily.   No facility-administered encounter medications on file as of 02/12/2017.    ROS was provided with assistance of staff Review of Systems  Constitutional: Negative for activity change, appetite change, chills, diaphoresis, fatigue and fever.  HENT: Positive for hearing loss. Negative for congestion, trouble swallowing and voice change.   Eyes: Negative for visual disturbance.  Respiratory: Negative for cough, choking, shortness of breath and wheezing.   Cardiovascular: Negative for chest pain, palpitations and leg swelling.  Gastrointestinal: Positive for vomiting. Negative for abdominal distention, abdominal pain, constipation, diarrhea and nausea.  Endocrine: Negative for cold intolerance.  Genitourinary: Negative for decreased urine volume, difficulty urinating, dysuria and frequency.  Musculoskeletal: Positive for gait problem. Negative for arthralgias, back pain and joint swelling.  Skin: Positive for color change.       Left arm bruise  Neurological: Negative for dizziness, tremors, speech difficulty, weakness and headaches.       Dementia  Psychiatric/Behavioral: Positive for confusion. Negative for agitation, behavioral problems, hallucinations and sleep disturbance. The patient is not nervous/anxious.     Immunization History  Administered Date(s) Administered  . Influenza-Unspecified 12/04/2015  . PPD Test 08/12/2015, 09/02/2015, 09/04/2015  . Tdap 08/10/2014   Pertinent  Health Maintenance Due  Topic Date Due  . INFLUENZA VACCINE  09/16/2016  . DEXA SCAN  Completed  . PNA vac Low Risk Adult  Completed   Fall Risk  10/16/2016  Falls in the past year? No   Functional Status Survey:    Vitals:    02/12/17 1425  BP: 122/68  Pulse: 61  Resp: 14  Temp: (!) 97.1 F (36.2 C)  SpO2: 97%  Weight: 155 lb 12.8 oz (70.7 kg)  Height: 5\' 7"  (1.702 m)   Body mass index is 24.4 kg/m. Physical Exam  Constitutional: She appears well-developed and well-nourished.  HENT:  Head: Normocephalic and atraumatic.  Eyes: Conjunctivae and EOM are normal. Pupils are equal, round, and reactive to light.  Neck: Normal range of motion. Neck supple. No JVD present. No thyromegaly present.  Cardiovascular: Normal rate, regular rhythm and normal heart sounds.  No murmur heard. Abdominal: Soft. Bowel sounds are normal. She exhibits no distension. There is no tenderness.  Musculoskeletal: Normal range of motion. She exhibits no edema or tenderness.  Neurological: She is alert. She exhibits normal muscle tone. Coordination normal.  Oriented to person and place  Skin: Skin is warm and dry.  A large bruise left antecubital area from venipuncture.   Psychiatric: She has a normal mood and affect. Her behavior is normal. Thought content normal.    Labs reviewed: Recent Labs    07/14/16 1626 11/17/16 12/22/16  NA 137 141 140  K 5.0 4.0 4.3  CL 101  --   --   CO2 25  --   --   GLUCOSE 182*  --   --   BUN 24 13 16   CREATININE 0.69 0.8 0.8  CALCIUM 11.7*  --   --    Recent Labs    11/17/16 02/05/17  AST 17 55*  ALT 15 79*  ALKPHOS 56 50   Recent Labs    07/14/16 1626  WBC 10.3  NEUTROABS 9.2*  HGB 14.2  HCT 43.5  MCV 94  PLT 203   Lab Results  Component Value Date   TSH 1.76 08/20/2016   No results found for: HGBA1C Lab Results  Component Value Date   CHOL 203 (A) 11/17/2016   HDL 39 11/17/2016   LDLCALC 126 11/17/2016   TRIG 236 (A) 11/17/2016    Significant Diagnostic Results in last 30 days:  No results found.  Assessment/Plan Late onset Alzheimer's disease without behavioral disturbance history of dementia, resides in [facility] Care Unit, ambulates with walker  independently, she is continent of urine, oriented to person and place, continue Namzaric to preserve memory.   Constipation Her constipation is stable, continue MiraLax daily.   Hypothyroidism Hypothyroidism, on Levothyroxine 37mcg qd, last TSH 1.76 08/20/16  Paroxysmal A-fib (HCC)  Afib, heart rate is in control, continue Eliquis 5mg  bid for thromboembolic risk reduction.       Family/ staff Communication: plan of care reviewed with the patient and charge nurse  Labs/tests ordered:  none  Time spend 25 minutes

## 2017-02-12 NOTE — Assessment & Plan Note (Signed)
Hypothyroidism, on Levothyroxine 21mcg qd, last TSH 1.76 08/20/16

## 2017-03-18 ENCOUNTER — Non-Acute Institutional Stay (SKILLED_NURSING_FACILITY): Payer: Medicare Other | Admitting: Internal Medicine

## 2017-03-18 ENCOUNTER — Encounter: Payer: Self-pay | Admitting: Internal Medicine

## 2017-03-18 DIAGNOSIS — Z7901 Long term (current) use of anticoagulants: Secondary | ICD-10-CM

## 2017-03-18 DIAGNOSIS — J309 Allergic rhinitis, unspecified: Secondary | ICD-10-CM

## 2017-03-18 DIAGNOSIS — E039 Hypothyroidism, unspecified: Secondary | ICD-10-CM

## 2017-03-18 DIAGNOSIS — K5901 Slow transit constipation: Secondary | ICD-10-CM | POA: Diagnosis not present

## 2017-03-18 NOTE — Progress Notes (Signed)
Location:  [facility] Room Number: 110 Place of Service:  SNF [phone]) Provider:  Blanchie Serve MD  Blanchie Serve, MD  Patient Care Team: Blanchie Serve, MD as PCP - General (Internal Medicine)  Extended Emergency Contact Information Primary Emergency Contact: [name],[name] Address: [address] [place] [address] [name] of [place] Phone: [phone] Mobile Phone: [phone] Relation: Other  Code Status:  DNR  Goals of care: Advanced Directive information Advanced Directives 03/18/2017  Does Patient Have a Medical Advance Directive? Yes  Type of Advance Directive Out of facility DNR (pink MOST or yellow form)  Does patient want to make changes to medical advance directive? No - Patient declined  Copy of [place] in Chart? -  Would patient like information on creating a medical advance directive? -  Pre-existing out of facility DNR order (yellow form or pink MOST form) Yellow form placed in chart (order not valid for inpatient use)     Chief Complaint  Patient presents with  . Medical Management of Chronic Issues    Routine Visit     HPI:  Pt is a 84 y.o. female seen today for medical management of chronic diseases.  She denies any concern to me. No acute concern from nursing. She takes her medications. She walks around the memory care unit.    Past Medical History:  Diagnosis Date  . Arthritis   . First degree AV block   . Hypothyroidism   . Hypothyroidism    left thyroid  . Lichen sclerosus   . OA (osteoarthritis)    hands  . Osteoporosis   . Paroxysmal atrial fibrillation (HCC)   . Sick sinus syndrome ([place])   . Vitamin D deficiency    Past Surgical History:  Procedure Laterality Date  . ABDOMINAL HYSTERECTOMY  1974  . APPENDECTOMY  1951  . EP IMPLANTABLE DEVICE N/A 08/13/2014   Procedure: Pacemaker Implant;  Surgeon: Deboraha Sprang, MD;  Location: [facility];  Service:  Cardiovascular;  Laterality: N/A;  . HAND SURGERY  20years ago   open dislocation  . TONSILLECTOMY  1942   x2  . TOTAL KNEE ARTHROPLASTY  01/25/2012   Procedure: TOTAL KNEE ARTHROPLASTY;  Surgeon: Gearlean Alf, MD;  Location: [facility];  Service: Orthopedics;  Laterality: Right;    Allergies  Allergen Reactions  . Augmentin [Amoxicillin-Pot Clavulanate] Nausea And Vomiting  . Quinine Derivatives Other (See Comments)    Pt states "im just allergic" unknown reaction  . Penicillins Rash    Outpatient Encounter Medications as of 03/18/2017  Medication Sig  . acetaminophen (TYLENOL) 500 MG tablet Take 500 mg by mouth every 6 (six) hours as needed (PAIN).   [place] Kitchen apixaban (ELIQUIS) 5 MG TABS tablet Take 1 tablet (5 mg total) by mouth 2 (two) times daily.  [place] Kitchen Apoaequorin (PREVAGEN) 10 MG CAPS Take 1 capsule by mouth daily.  . cetaphil (CETAPHIL) cream Apply 1 application topically daily.  . cetirizine (ZYRTEC) 10 MG tablet Take 10 mg by mouth at bedtime.   . cholecalciferol (VITAMIN D) 1000 units tablet Take 1,000 Units by mouth daily.  . clobetasol cream (TEMOVATE) 7.93 % Apply 1 application topically 2 (two) times daily as needed.   . Homeopathic Products (MUSCLE THERAPY/ARNICA) GEL Apply 1 application topically at bedtime.  [place] Kitchen levothyroxine (SYNTHROID, LEVOTHROID) 88 MCG tablet Take 88 mcg by mouth daily before breakfast.  . Lutein-Zeaxanthin 20-1  MG CAPS Take 1 tablet by mouth daily.  . Magnesium 500 MG CAPS Take 1 capsule by mouth daily.  . Memantine HCl-Donepezil HCl (NAMZARIC) 21-10 MG CP24 Take 1 tablet by mouth daily.  . mometasone (ELOCON) 0.1 % cream Apply 1 application topically as needed.   Vladimir Faster Glycol-Propyl Glycol (SYSTANE) 0.4-0.3 % SOLN Apply 1 drop to eye 2 (two) times daily. For both eyes.  . polyethylene glycol (MIRALAX / GLYCOLAX) packet Take 17 g by mouth daily.  [place] Kitchen triamcinolone cream (KENALOG) 0.1 % Apply 1 application topically 2 (two) times daily as needed. Apply to  hands and legs  . [DISCONTINUED] Calcium-Magnesium-Vitamin D (CALCIUM 1200+D3 PO) Take 1 tablet by mouth daily.   No facility-administered encounter medications on file as of 03/18/2017.     Review of Systems  Constitutional: Negative for appetite change and fever.  HENT: Negative for congestion, ear pain, rhinorrhea and trouble swallowing.   Respiratory: Negative for cough and shortness of breath.   Cardiovascular: Negative for chest pain and leg swelling.  Gastrointestinal: Negative for abdominal pain, constipation, diarrhea, nausea and vomiting.  Genitourinary: Negative for dysuria.  Musculoskeletal: Negative for back pain.  Skin: Negative for rash.  Psychiatric/Behavioral: Positive for confusion. Negative for behavioral problems.    Immunization History  Administered Date(s) Administered  . Influenza-Unspecified 12/04/2015  . PPD Test 08/12/2015, 09/02/2015, 09/04/2015  . Tdap 08/10/2014   Pertinent  Health Maintenance Due  Topic Date Due  . INFLUENZA VACCINE  09/16/2016  . DEXA SCAN  Completed  . PNA vac Low Risk Adult  Completed   Fall Risk  10/16/2016  Falls in the past year? No   Functional Status Survey:    Vitals:   03/18/17 1046  BP: 118/68  Pulse: 68  Resp: 14  Temp: (!) 97.1 F (36.2 C)  TempSrc: Oral  SpO2: 97%  Weight: 156 lb 9.6 oz (71 kg)  Height: 5\' 6"  (1.676 m)   Body mass index is 25.28 kg/m.   Wt Readings from Last 3 Encounters:  03/18/17 156 lb 9.6 oz (71 kg)  02/12/17 155 lb 12.8 oz (70.7 kg)  01/18/17 152 lb 12.8 oz (69.3 kg)   Physical Exam  Constitutional: She is oriented to person, place, and time. She appears well-developed and well-nourished. No distress.  HENT:  Head: Normocephalic and atraumatic.  Mouth/Throat: Oropharynx is clear and moist.  Eyes: Conjunctivae and EOM are normal. Pupils are equal, round, and reactive to light.  Neck: Neck supple.  Cardiovascular: Normal rate and regular rhythm.  Pulmonary/Chest: Effort  normal and breath sounds normal. No respiratory distress. She has no wheezes. She has no rales.  Abdominal: Soft. Bowel sounds are normal. There is no tenderness. There is no guarding.  Musculoskeletal:  Able to move all 4 extremities  Lymphadenopathy:    She has no cervical adenopathy.  Neurological: She is alert and oriented to person, place, and time.  Skin: Skin is warm and dry. No rash noted. She is not diaphoretic.    Labs reviewed: Recent Labs    07/14/16 1626 11/17/16 12/22/16  NA 137 141 140  K 5.0 4.0 4.3  CL 101  --   --   CO2 25  --   --   GLUCOSE 182*  --   --   BUN 24 13 16   CREATININE 0.69 0.8 0.8  CALCIUM 11.7*  --   --    Recent Labs    11/17/16 02/05/17  AST 17 55*  ALT 15 79*  ALKPHOS 56 50   Recent Labs    07/14/16 1626  WBC 10.3  NEUTROABS 9.2*  HGB 14.2  HCT 43.5  MCV 94  PLT 203   Lab Results  Component Value Date   TSH 1.76 08/20/2016   No results found for: HGBA1C Lab Results  Component Value Date   CHOL 203 (A) 11/17/2016   HDL 39 11/17/2016   LDLCALC 126 11/17/2016   TRIG 236 (A) 11/17/2016    Significant Diagnostic Results in last 30 days:  No results found.  Assessment/Plan  Hypothyroidism Lab Results  Component Value Date   TSH 1.76 08/20/2016   Continue levothyroxine 88 mcg daily and check TSH.  Chronic constipation Continue miralax and monitor for now   Allergic rhinitis No runny or stuffy nose, clear lungs. No cough. Change cetirizine to 10 mg daily as needed and monitor  Long term anticoagulation With afib, continue eliquis. Check cbc  Family/ staff Communication: reviewed care plan with patient and charge nurse.    Labs/tests ordered:  Cbc, TSH   Blanchie Serve, MD Internal Medicine [place], [place] [address] Cell Phone (Monday-Friday 8 am - 5 pm): [phone] On Call: [phone] and follow prompts after 5 pm and on weekends Office  Phone: [phone] Office Fax: [phone]

## 2017-03-23 LAB — CBC AND DIFFERENTIAL
HEMATOCRIT: 38 (ref 36–46)
HEMOGLOBIN: 13.3 (ref 12.0–16.0)
Platelets: 157 (ref 150–399)
WBC: 5.4

## 2017-03-23 LAB — TSH: TSH: 0.1 — AB (ref <=5.90)

## 2017-03-24 ENCOUNTER — Other Ambulatory Visit: Payer: Self-pay | Admitting: *Deleted

## 2017-04-15 ENCOUNTER — Non-Acute Institutional Stay (SKILLED_NURSING_FACILITY): Payer: Medicare Other | Admitting: Nurse Practitioner

## 2017-04-15 ENCOUNTER — Encounter: Payer: Self-pay | Admitting: Nurse Practitioner

## 2017-04-15 DIAGNOSIS — G301 Alzheimer's disease with late onset: Secondary | ICD-10-CM | POA: Diagnosis not present

## 2017-04-15 DIAGNOSIS — F0280 Dementia in other diseases classified elsewhere without behavioral disturbance: Secondary | ICD-10-CM | POA: Diagnosis not present

## 2017-04-15 DIAGNOSIS — L900 Lichen sclerosus et atrophicus: Secondary | ICD-10-CM

## 2017-04-15 DIAGNOSIS — I480 Paroxysmal atrial fibrillation: Secondary | ICD-10-CM | POA: Diagnosis not present

## 2017-04-15 DIAGNOSIS — Z7901 Long term (current) use of anticoagulants: Secondary | ICD-10-CM

## 2017-04-15 DIAGNOSIS — K5901 Slow transit constipation: Secondary | ICD-10-CM | POA: Diagnosis not present

## 2017-04-15 NOTE — Assessment & Plan Note (Signed)
Stable, continue MiraLax daily.  

## 2017-04-15 NOTE — Assessment & Plan Note (Signed)
Heart rate is in control.  

## 2017-04-15 NOTE — Assessment & Plan Note (Signed)
Chronic lichen sclerosus, prn Triamcinolone cream was used more frequently on hands and legs for flare ups.will dc Clobetasol and Mometasone prn for now

## 2017-04-15 NOTE — Assessment & Plan Note (Signed)
dementia, resides in [facility], self transfer and toileting, continue Memantine and Donepezil.

## 2017-04-15 NOTE — Progress Notes (Signed)
Location:  [facility] Room Number: 110 Place of Service:  SNF ([phone]) Provider:  Gionni Vaca, [place]  NP   Blanchie Serve, MD  Patient Care Team: Blanchie Serve, MD as PCP - General (Internal Medicine)  Extended Emergency Contact Information Primary Emergency Contact: [name],[name] Address: [address] [place] [address] [name] of [place] Phone: [phone] Mobile Phone: [phone] Relation: Other  Code Status:  DNR Goals of care: Advanced Directive information Advanced Directives 04/15/2017  Does Patient Have a Medical Advance Directive? Yes  Type of Advance Directive Out of facility DNR (pink MOST or yellow form)  Does patient want to make changes to medical advance directive? No - Patient declined  Copy of [place] in Chart? -  Would patient like information on creating a medical advance directive? -  Pre-existing out of facility DNR order (yellow form or pink MOST form) Yellow form placed in chart (order not valid for inpatient use)     Chief Complaint  Patient presents with  . Medical Management of Chronic Issues    F/U - Hypothyroidism    HPI:  Pt is a 84 y.o. female seen today for medical management of chronic diseases.     The patient has history of dementia, resides in [facility], self transfer and toileting, on Memantine and Donepezil. No constipation while on MiraLax daily. She takes Eliquis 5mg  bid for hx Afib. Chronic lichen sclerosus, prn Triamcinolone cream was used more frequently on hands and legs for flare ups.    Past Medical History:  Diagnosis Date  . Arthritis   . First degree AV block   . Hypothyroidism   . Hypothyroidism    left thyroid  . Lichen sclerosus   . OA (osteoarthritis)    hands  . Osteoporosis   . Paroxysmal atrial fibrillation (HCC)   . Sick sinus syndrome ([place])   . Vitamin D deficiency    Past Surgical History:  Procedure Laterality Date    . ABDOMINAL HYSTERECTOMY  1974  . APPENDECTOMY  1951  . EP IMPLANTABLE DEVICE N/A 08/13/2014   Procedure: Pacemaker Implant;  Surgeon: Deboraha Sprang, MD;  Location: [facility];  Service: Cardiovascular;  Laterality: N/A;  . HAND SURGERY  20years ago   open dislocation  . TONSILLECTOMY  1942   x2  . TOTAL KNEE ARTHROPLASTY  01/25/2012   Procedure: TOTAL KNEE ARTHROPLASTY;  Surgeon: Gearlean Alf, MD;  Location: [facility];  Service: Orthopedics;  Laterality: Right;    Allergies  Allergen Reactions  . Augmentin [Amoxicillin-Pot Clavulanate] Nausea And Vomiting  . Quinine Derivatives Other (See Comments)    Pt states "im just allergic" unknown reaction  . Penicillins Rash    Outpatient Encounter Medications as of 04/15/2017  Medication Sig  . acetaminophen (TYLENOL) 500 MG tablet Take 500 mg by mouth every 6 (six) hours as needed (PAIN).   [place] Kitchen apixaban (ELIQUIS) 5 MG TABS tablet Take 1 tablet (5 mg total) by mouth 2 (two) times daily.  [place] Kitchen Apoaequorin (PREVAGEN) 10 MG CAPS Take 1 capsule by mouth daily.  . cetaphil (CETAPHIL) cream Apply 1 application topically daily.  . cetirizine (ZYRTEC) 10 MG tablet Take 10 mg by mouth at bedtime.   . cholecalciferol (VITAMIN D) 1000 units tablet Take 1,000 Units by mouth daily.  . clobetasol cream (TEMOVATE) 6.29 % Apply 1 application topically 2 (two) times daily as needed.   Mar[place] Kitchen  Homeopathic Products (MUSCLE THERAPY/ARNICA) GEL Apply 1 application topically at bedtime.  . Lutein-Zeaxanthin 20-1 MG CAPS Take 1 tablet by mouth daily.  . Magnesium 500 MG CAPS Take 1 capsule by mouth daily.  . Memantine HCl-Donepezil HCl (NAMZARIC) 21-10 MG CP24 Take 1 tablet by mouth daily.  . mometasone (ELOCON) 0.1 % cream Apply 1 application topically as needed.   Vladimir Faster Glycol-Propyl Glycol (SYSTANE) 0.4-0.3 % SOLN Apply 1 drop to eye 2 (two) times daily. For both eyes.  . polyethylene glycol (MIRALAX / GLYCOLAX) packet Take 17 g by mouth daily.  [place] Kitchen  triamcinolone cream (KENALOG) 0.1 % Apply 1 application topically 2 (two) times daily as needed. Apply to hands and legs  . [DISCONTINUED] levothyroxine (SYNTHROID, LEVOTHROID) 88 MCG tablet Take 88 mcg by mouth daily before breakfast.   No facility-administered encounter medications on file as of 04/15/2017.    ROS was provided with assistance of staff Review of Systems  Constitutional: Negative for activity change, appetite change, chills, diaphoresis, fatigue and fever.  HENT: Positive for hearing loss. Negative for congestion, trouble swallowing and voice change.   Eyes: Negative for visual disturbance.  Respiratory: Negative for cough, choking and wheezing.   Cardiovascular: Negative for chest pain, palpitations and leg swelling.  Gastrointestinal: Negative for abdominal distention, abdominal pain, constipation, diarrhea, nausea and vomiting.  Endocrine: Negative for cold intolerance.  Genitourinary: Negative for difficulty urinating, dysuria and urgency.  Musculoskeletal: Positive for gait problem. Negative for back pain and joint swelling.  Skin: Negative for color change and pallor.       History of lichen sclerosus.   Neurological: Negative for tremors, speech difficulty, weakness and headaches.       Dementia.     Immunization History  Administered Date(s) Administered  . Influenza-Unspecified 12/04/2015  . PPD Test 08/12/2015, 09/02/2015, 09/04/2015  . Tdap 08/10/2014   Pertinent  Health Maintenance Due  Topic Date Due  . INFLUENZA VACCINE  09/16/2016  . DEXA SCAN  Completed  . PNA vac Low Risk Adult  Completed   Fall Risk  10/16/2016  Falls in the past year? No   Functional Status Survey:    Vitals:   04/15/17 1538  BP: 124/64  Pulse: 68  Resp: 18  Temp: 97.6 F (36.4 C)  Weight: 158 lb 6.4 oz (71.8 kg)  Height: 5\' 7"  (1.702 m)   Body mass index is 24.81 kg/m. Physical Exam  Constitutional: She appears well-developed and well-nourished.  HENT:  Head:  Normocephalic and atraumatic.  Eyes: Conjunctivae and EOM are normal. Pupils are equal, round, and reactive to light.  Neck: Normal range of motion. Neck supple. No JVD present. No thyromegaly present.  Cardiovascular: Normal rate and regular rhythm.  No murmur heard. Pulmonary/Chest: Effort normal and breath sounds normal. She has no wheezes. She has no rales.  Abdominal: Soft. Bowel sounds are normal. She exhibits no distension. There is no tenderness.  Musculoskeletal: Normal range of motion. She exhibits no edema or tenderness.  Self transfer and toileting, ambulates with walker.   Neurological: She is alert. She exhibits normal muscle tone. Coordination normal.  Oriented to self and her room on unit.   Skin: Skin is warm and dry. No rash noted. No erythema.  A few scaly patches on hands and legs.     Labs reviewed: Recent Labs    07/14/16 1626 11/17/16 12/22/16  NA 137 141 140  K 5.0 4.0 4.3  CL 101  --   --   CO2  25  --   --   GLUCOSE 182*  --   --   BUN 24 13 16   CREATININE 0.69 0.8 0.8  CALCIUM 11.7*  --   --    Recent Labs    11/17/16 02/05/17  AST 17 55*  ALT 15 79*  ALKPHOS 56 50   Recent Labs    07/14/16 1626 03/23/17  WBC 10.3 5.4  NEUTROABS 9.2*  --   HGB 14.2 13.3  HCT 43.5 38  MCV 94  --   PLT 203 157   Lab Results  Component Value Date   TSH 0.10 (A) 03/23/2017   No results found for: HGBA1C Lab Results  Component Value Date   CHOL 203 (A) 11/17/2016   HDL 39 11/17/2016   LDLCALC 126 11/17/2016   TRIG 236 (A) 11/17/2016    Significant Diagnostic Results in last 30 days:  No results found.  Assessment/Plan Lichen sclerosus Chronic lichen sclerosus, prn Triamcinolone cream was used more frequently on hands and legs for flare ups.will dc Clobetasol and Mometasone prn for now  Late onset Alzheimer's disease without behavioral disturbance dementia, resides in [facility], self transfer and toileting, continue Memantine and  Donepezil.   Constipation Stable, continue MiraLax daily.   Chronic anticoagulation Continue Eliquis 5mg  bid for thromboembolic risk reduction in setting of Afib.   Paroxysmal A-fib (HCC) Heart rate is in control.     Family/ staff Communication: plan of care reviewed with the patient and charge nurse.   Labs/tests ordered:  none  Time spend 25 minutes.

## 2017-04-15 NOTE — Assessment & Plan Note (Signed)
Continue Eliquis 5mg  bid for thromboembolic risk reduction in setting of Afib.

## 2017-05-06 LAB — TSH: TSH: 1.16 (ref <=5.90)

## 2017-05-07 ENCOUNTER — Other Ambulatory Visit: Payer: Self-pay | Admitting: *Deleted

## 2017-05-14 ENCOUNTER — Non-Acute Institutional Stay (SKILLED_NURSING_FACILITY): Payer: Medicare Other | Admitting: Nurse Practitioner

## 2017-05-14 ENCOUNTER — Encounter: Payer: Self-pay | Admitting: Nurse Practitioner

## 2017-05-14 DIAGNOSIS — I480 Paroxysmal atrial fibrillation: Secondary | ICD-10-CM

## 2017-05-14 DIAGNOSIS — G301 Alzheimer's disease with late onset: Secondary | ICD-10-CM

## 2017-05-14 DIAGNOSIS — K5900 Constipation, unspecified: Secondary | ICD-10-CM | POA: Diagnosis not present

## 2017-05-14 DIAGNOSIS — F0280 Dementia in other diseases classified elsewhere without behavioral disturbance: Secondary | ICD-10-CM | POA: Diagnosis not present

## 2017-05-14 NOTE — Assessment & Plan Note (Signed)
dementia, resides in memory care unit, independent transfer and ambulation. Continue  Memantine/Donepezil 21/10 mg po daily.

## 2017-05-14 NOTE — Assessment & Plan Note (Signed)
No constipation, continue MiraLax daily.  

## 2017-05-14 NOTE — Progress Notes (Signed)
Location:  [facility] Room Number: 110 Place of Service:  SNF ([phone]) Provider:  Issiah Huffaker, Manxie  NP  Blanchie Serve, MD  Patient Care Team: Blanchie Serve, MD as PCP - General (Internal Medicine)  Extended Emergency Contact Information Primary Emergency Contact: [name],[name] Address: [address] [place] [address] [name] of [place] Phone: [phone] Mobile Phone: [phone] Relation: Other  Code Status:  DNR Goals of care: Advanced Directive information Advanced Directives 05/14/2017  Does Patient Have a Medical Advance Directive? Yes  Type of Advance Directive Out of facility DNR (pink MOST or yellow form)  Does patient want to make changes to medical advance directive? No - Patient declined  Copy of [place] in Chart? -  Would patient like information on creating a medical advance directive? -  Pre-existing out of facility DNR order (yellow form or pink MOST form) Yellow form placed in chart (order not valid for inpatient use)     Chief Complaint  Patient presents with  . Medical Management of Chronic Issues    F/U- Dementia, alzheimers,    HPI:  Pt is a 84 y.o. female seen today for medical management of chronic diseases.     The patient hs history of dementia, resides in memory care unit, independent transfer and ambulation. On Memantine/Donepezil 21/10 mg po daily. No constipation on MiraLax daily. Heart rate is in control, not taking rhythm agent, on Eliquis 5mg  bid for thromboembolic risk reduction.  Past Medical History:  Diagnosis Date  . Arthritis   . First degree AV block   . Hypothyroidism   . Hypothyroidism    left thyroid  . Lichen sclerosus   . OA (osteoarthritis)    hands  . Osteoporosis   . Paroxysmal atrial fibrillation (HCC)   . Sick sinus syndrome ([place])   . Vitamin D deficiency    Past Surgical History:  Procedure Laterality Date  . ABDOMINAL HYSTERECTOMY  1974    . APPENDECTOMY  1951  . EP IMPLANTABLE DEVICE N/A 08/13/2014   Procedure: Pacemaker Implant;  Surgeon: Deboraha Sprang, MD;  Location: [facility];  Service: Cardiovascular;  Laterality: N/A;  . HAND SURGERY  20years ago   open dislocation  . TONSILLECTOMY  1942   x2  . TOTAL KNEE ARTHROPLASTY  01/25/2012   Procedure: TOTAL KNEE ARTHROPLASTY;  Surgeon: Gearlean Alf, MD;  Location: [facility];  Service: Orthopedics;  Laterality: Right;    Allergies  Allergen Reactions  . Augmentin [Amoxicillin-Pot Clavulanate] Nausea And Vomiting  . Quinine Derivatives Other (See Comments)    Pt states "im just allergic" unknown reaction  . Penicillins Rash    Outpatient Encounter Medications as of 05/14/2017  Medication Sig  . acetaminophen (TYLENOL) 500 MG tablet Take 500 mg by mouth every 6 (six) hours as needed (PAIN).   Marland Kitchen apixaban (ELIQUIS) 5 MG TABS tablet Take 1 tablet (5 mg total) by mouth 2 (two) times daily.  Marland Kitchen Apoaequorin (PREVAGEN) 10 MG CAPS Take 1 capsule by mouth daily.  . cetaphil (CETAPHIL) cream Apply 1 application topically daily.  . cetirizine (ZYRTEC) 10 MG tablet Take 10 mg by mouth at bedtime.   . cholecalciferol (VITAMIN D) 1000 units tablet Take 1,000 Units by mouth daily.  . feeding supplement (BOOST / RESOURCE BREEZE) LIQD Take 1 Container by mouth 2 (two) times daily between meals.  . Homeopathic Products (MUSCLE THERAPY/ARNICA) GEL Apply  1 application topically at bedtime.  Marland Kitchen levothyroxine (SYNTHROID, LEVOTHROID) 75 MCG tablet Take 75 mcg by mouth daily before breakfast.  . Lutein-Zeaxanthin 20-1 MG CAPS Take 1 tablet by mouth daily.  . Magnesium 500 MG CAPS Take 1 capsule by mouth daily.  . Memantine HCl-Donepezil HCl (NAMZARIC) 21-10 MG CP24 Take 1 tablet by mouth daily.  . mometasone (ELOCON) 0.1 % cream Apply 1 application topically as needed.   Vladimir Faster Glycol-Propyl Glycol (SYSTANE) 0.4-0.3 % SOLN Apply 1 drop to eye 2 (two) times daily. For both eyes.  .  polyethylene glycol (MIRALAX / GLYCOLAX) packet Take 17 g by mouth daily.  Marland Kitchen triamcinolone cream (KENALOG) 0.1 % Apply 1 application topically 2 (two) times daily as needed. Apply to hands and legs  . [DISCONTINUED] clobetasol cream (TEMOVATE) 0.99 % Apply 1 application topically 2 (two) times daily as needed.    No facility-administered encounter medications on file as of 05/14/2017.   ROS was provided with assistance of staff  Review of Systems  Constitutional: Negative for activity change, appetite change, chills, diaphoresis, fatigue and fever.  HENT: Positive for hearing loss. Negative for congestion, trouble swallowing and voice change.   Respiratory: Negative for cough, choking, shortness of breath and wheezing.   Cardiovascular: Negative for chest pain, palpitations and leg swelling.  Gastrointestinal: Negative for abdominal distention, abdominal pain, constipation, diarrhea, nausea and vomiting.  Genitourinary: Negative for difficulty urinating, dysuria and urgency.  Musculoskeletal: Negative for back pain and joint swelling.  Skin: Negative for pallor and rash.       Chronic lichen sclerosis  Neurological: Negative for speech difficulty, weakness and headaches.       Dementia.   Psychiatric/Behavioral: Positive for confusion. Negative for agitation, behavioral problems, hallucinations and sleep disturbance. The patient is not nervous/anxious.     Immunization History  Administered Date(s) Administered  . Influenza-Unspecified 12/04/2015  . PPD Test 08/12/2015, 09/02/2015, 09/04/2015  . Tdap 08/10/2014   Pertinent  Health Maintenance Due  Topic Date Due  . INFLUENZA VACCINE  09/16/2017  . DEXA SCAN  Completed  . PNA vac Low Risk Adult  Completed   Fall Risk  10/16/2016  Falls in the past year? No   Functional Status Survey:    Vitals:   05/14/17 1008  BP: 120/60  Pulse: 62  Weight: 158 lb 6.4 oz (71.8 kg)  Height: 5\' 7"  (1.702 m)   Body mass index is 24.81  kg/m. Physical Exam  Constitutional: She appears well-developed and well-nourished.  HENT:  Head: Normocephalic and atraumatic.  Eyes: Pupils are equal, round, and reactive to light. Conjunctivae and EOM are normal.  Neck: Normal range of motion. Neck supple. No JVD present. No thyromegaly present.  Cardiovascular: Normal rate and regular rhythm.  No murmur heard. Pulmonary/Chest: Effort normal and breath sounds normal. She has no wheezes. She has no rales.  Abdominal: Soft. Bowel sounds are normal. She exhibits no distension. There is no tenderness.  Musculoskeletal: She exhibits no edema or tenderness.  Neurological: She is alert. She exhibits normal muscle tone. Coordination normal.  Oriented to self and her room on unit.   Skin: Skin is warm and dry.  Chronic skin condition, lichen sclerosis.   Psychiatric: She has a normal mood and affect. Her behavior is normal.    Labs reviewed: Recent Labs    07/14/16 1626 11/17/16 12/22/16  NA 137 141 140  K 5.0 4.0 4.3  CL 101  --   --   CO2 25  --   --  GLUCOSE 182*  --   --   BUN 24 13 16   CREATININE 0.69 0.8 0.8  CALCIUM 11.7*  --   --    Recent Labs    11/17/16 02/05/17  AST 17 55*  ALT 15 79*  ALKPHOS 56 50   Recent Labs    07/14/16 1626 03/23/17  WBC 10.3 5.4  NEUTROABS 9.2*  --   HGB 14.2 13.3  HCT 43.5 38  MCV 94  --   PLT 203 157   Lab Results  Component Value Date   TSH 1.16 05/06/2017   No results found for: HGBA1C Lab Results  Component Value Date   CHOL 203 (A) 11/17/2016   HDL 39 11/17/2016   LDLCALC 126 11/17/2016   TRIG 236 (A) 11/17/2016    Significant Diagnostic Results in last 30 days:  No results found.  Assessment/Plan Paroxysmal A-fib (HCC) Heart rate is in control, not taking rhythm agent, on Eliquis 5mg  bid for thromboembolic risk reduction.    Late onset Alzheimer's disease without behavioral disturbance dementia, resides in memory care unit, independent transfer and  ambulation. Continue  Memantine/Donepezil 21/10 mg po daily.   Constipation No constipation, continue MiraLax daily.      Family/ staff Communication: plan of care reviewed with the patient and charge nurse.   Labs/tests ordered:  None  Time spend 25 minutes.

## 2017-05-14 NOTE — Assessment & Plan Note (Signed)
Heart rate is in control, not taking rhythm agent, on Eliquis 5mg  bid for thromboembolic risk reduction.

## 2017-06-07 ENCOUNTER — Non-Acute Institutional Stay (SKILLED_NURSING_FACILITY): Payer: Medicare Other | Admitting: Nurse Practitioner

## 2017-06-07 ENCOUNTER — Encounter: Payer: Self-pay | Admitting: Nurse Practitioner

## 2017-06-07 DIAGNOSIS — M1712 Unilateral primary osteoarthritis, left knee: Secondary | ICD-10-CM | POA: Diagnosis not present

## 2017-06-07 DIAGNOSIS — G301 Alzheimer's disease with late onset: Secondary | ICD-10-CM | POA: Diagnosis not present

## 2017-06-07 DIAGNOSIS — F0280 Dementia in other diseases classified elsewhere without behavioral disturbance: Secondary | ICD-10-CM

## 2017-06-07 NOTE — Assessment & Plan Note (Signed)
HPI was provided with assistance of staff due to her dementia. She resides in memory care unit [facility], continue Memantine/Donepezil for memory,

## 2017-06-07 NOTE — Assessment & Plan Note (Signed)
left knee pain, not new, doesn't interfere her night rest, worsens with walking, no trauma, redness, swelling, or warm. The left knee is larger than the right, no ballottement noted.  self transfer and ambulates with walker. Apply BioFreeze qid to the left knee x 4 weeks.

## 2017-06-07 NOTE — Progress Notes (Signed)
Location:  [facility] Room Number: 110 Place of Service:  SNF [phone]) Provider:  Genoveva Singleton, Lennie Odor  NP  Blanchie Serve, MD  Patient Care Team: Blanchie Serve, MD as PCP - General (Internal Medicine)  Extended Emergency Contact Information Primary Emergency Contact: [name],[name] Address: [address] [place] [address] [name] of [place] Phone: [phone] Mobile Phone: [phone] Relation: Other  Code Status:  DNR Goals of care: Advanced Directive information Advanced Directives 06/07/2017  Does Patient Have a Medical Advance Directive? Yes  Type of Advance Directive Out of facility DNR (pink MOST or yellow form)  Does patient want to make changes to medical advance directive? No - Patient declined  Copy of [place] in Chart? -  Would patient like information on creating a medical advance directive? -  Pre-existing out of facility DNR order (yellow form or pink MOST form) Yellow form placed in chart (order not valid for inpatient use)     Chief Complaint  Patient presents with  . Acute Visit    (L) Knee and leg edema    HPI:  Pt is a 84 y.o. female seen today for an acute visit for left knee pain, not new, doesn't interfere her night rest, worsens with walking, no trauma, redness, swelling, or warm. The left knee is larger than the right, no ballottement noted. HPI was provided with assistance of staff due to her dementia. She resides in memory care unit [facility], on Memantine/Donepezil for memory, self transfer and ambulates with walker.    Past Medical History:  Diagnosis Date  . Arthritis   . First degree AV block   . Hypothyroidism   . Hypothyroidism    left thyroid  . Lichen sclerosus   . OA (osteoarthritis)    hands  . Osteoporosis   . Paroxysmal atrial fibrillation (HCC)   . Sick sinus syndrome ([place])   . Vitamin D deficiency    Past Surgical History:  Procedure Laterality Date  .  ABDOMINAL HYSTERECTOMY  1974  . APPENDECTOMY  1951  . EP IMPLANTABLE DEVICE N/A 08/13/2014   Procedure: Pacemaker Implant;  Surgeon: Deboraha Sprang, MD;  Location: [facility];  Service: Cardiovascular;  Laterality: N/A;  . HAND SURGERY  20years ago   open dislocation  . TONSILLECTOMY  1942   x2  . TOTAL KNEE ARTHROPLASTY  01/25/2012   Procedure: TOTAL KNEE ARTHROPLASTY;  Surgeon: Gearlean Alf, MD;  Location: [facility];  Service: Orthopedics;  Laterality: Right;    Allergies  Allergen Reactions  . Augmentin [Amoxicillin-Pot Clavulanate] Nausea And Vomiting  . Quinine Derivatives Other (See Comments)    Pt states "im just allergic" unknown reaction  . Penicillins Rash    Outpatient Encounter Medications as of 06/07/2017  Medication Sig  . acetaminophen (TYLENOL) 500 MG tablet Take 500 mg by mouth every 6 (six) hours as needed (PAIN).   Marland Kitchen apixaban (ELIQUIS) 5 MG TABS tablet Take 1 tablet (5 mg total) by mouth 2 (two) times daily.  Marland Kitchen Apoaequorin (PREVAGEN) 10 MG CAPS Take 1 capsule by mouth daily.  . cetaphil (CETAPHIL) cream Apply 1 application topically daily.  . cetirizine (ZYRTEC) 10 MG tablet Take 10 mg by mouth at bedtime.   . cholecalciferol (VITAMIN D) 1000 units tablet Take 1,000 Units by mouth daily.  . feeding supplement (BOOST / RESOURCE BREEZE) LIQD Take 1 Container by mouth 2 (two) times daily  between meals.  . Homeopathic Products (MUSCLE THERAPY/ARNICA) GEL Apply 1 application topically at bedtime.  Marland Kitchen levothyroxine (SYNTHROID, LEVOTHROID) 75 MCG tablet Take 75 mcg by mouth daily before breakfast.  . Lutein-Zeaxanthin 20-1 MG CAPS Take 1 tablet by mouth daily.  . Magnesium 500 MG CAPS Take 1 capsule by mouth daily.  . Memantine HCl-Donepezil HCl (NAMZARIC) 21-10 MG CP24 Take 1 tablet by mouth daily.  Vladimir Faster Glycol-Propyl Glycol (SYSTANE) 0.4-0.3 % SOLN Apply 1 drop to eye 2 (two) times daily. For both eyes.  . polyethylene glycol (MIRALAX / GLYCOLAX) packet  Take 17 g by mouth daily.  Marland Kitchen triamcinolone cream (KENALOG) 0.1 % Apply 1 application topically 2 (two) times daily as needed. Apply to hands and legs  . [DISCONTINUED] mometasone (ELOCON) 0.1 % cream Apply 1 application topically as needed.    No facility-administered encounter medications on file as of 06/07/2017.    ROS was provided with assistance of staff Review of Systems  Constitutional: Negative for activity change, appetite change, chills, diaphoresis, fatigue and fever.  HENT: Positive for hearing loss.   Respiratory: Negative for cough, shortness of breath and wheezing.   Cardiovascular: Negative for chest pain, palpitations and leg swelling.  Musculoskeletal: Positive for arthralgias and gait problem.  Skin: Negative for color change and wound.  Neurological: Negative for speech difficulty, weakness and headaches.       Dementia  Psychiatric/Behavioral: Positive for confusion. Negative for agitation, behavioral problems, hallucinations and sleep disturbance. The patient is not nervous/anxious.     Immunization History  Administered Date(s) Administered  . Influenza-Unspecified 12/04/2015  . PPD Test 08/12/2015, 09/02/2015, 09/04/2015  . Tdap 08/10/2014   Pertinent  Health Maintenance Due  Topic Date Due  . INFLUENZA VACCINE  09/16/2017  . DEXA SCAN  Completed  . PNA vac Low Risk Adult  Completed   Fall Risk  10/16/2016  Falls in the past year? No   Functional Status Survey:    Vitals:   06/07/17 1248  BP: 116/74  Pulse: 66  Resp: 18  Temp: 97.6 F (36.4 C)  SpO2: 96%  Weight: 162 lb 9.6 oz (73.8 kg)  Height: 5\' 7"  (1.702 m)   Body mass index is 25.47 kg/m. Physical Exam  Constitutional: She appears well-developed and well-nourished. No distress.  HENT:  Head: Normocephalic and atraumatic.  Eyes: Pupils are equal, round, and reactive to light. EOM are normal.  Neck: Normal range of motion. Neck supple.  Cardiovascular: Normal rate and regular rhythm.    No murmur heard. Hx of Afib  Pulmonary/Chest: She has no wheezes. She has no rales.  Musculoskeletal: Normal range of motion. She exhibits tenderness. She exhibits no edema.  Left knee pain when walking, left knee is larger than the right, no ballottement, no warmth or redness noted. Self transfer, ambulates with walker.   Neurological: She is alert. No cranial nerve deficit. She exhibits normal muscle tone. Coordination normal.  Oriented to person and her room on unit.   Skin: Skin is warm and dry. No rash noted. She is not diaphoretic. No erythema. No pallor.  Hx of Lichen sclerosis, patchy redness at times.   Psychiatric: She has a normal mood and affect. Her behavior is normal.    Labs reviewed: Recent Labs    07/14/16 1626 11/17/16 12/22/16  NA 137 141 140  K 5.0 4.0 4.3  CL 101  --   --   CO2 25  --   --   GLUCOSE 182*  --   --  BUN 24 13 16   CREATININE 0.69 0.8 0.8  CALCIUM 11.7*  --   --    Recent Labs    11/17/16 02/05/17  AST 17 55*  ALT 15 79*  ALKPHOS 56 50   Recent Labs    07/14/16 1626 03/23/17  WBC 10.3 5.4  NEUTROABS 9.2*  --   HGB 14.2 13.3  HCT 43.5 38  MCV 94  --   PLT 203 157   Lab Results  Component Value Date   TSH 1.16 05/06/2017   No results found for: HGBA1C Lab Results  Component Value Date   CHOL 203 (A) 11/17/2016   HDL 39 11/17/2016   LDLCALC 126 11/17/2016   TRIG 236 (A) 11/17/2016    Significant Diagnostic Results in last 30 days:  No results found.  Assessment/Plan Primary osteoarthritis of left knee left knee pain, not new, doesn't interfere her night rest, worsens with walking, no trauma, redness, swelling, or warm. The left knee is larger than the right, no ballottement noted.  self transfer and ambulates with walker. Apply BioFreeze qid to the left knee x 4 weeks.    Late onset Alzheimer's disease without behavioral disturbance HPI was provided with assistance of staff due to her dementia. She resides in memory care  unit [facility], continue Memantine/Donepezil for memory,     Family/ staff Communication: plan of care reviewed with the patient and charge nurse.   Labs/tests ordered:  none  Time spend 25 minutes.

## 2017-06-15 ENCOUNTER — Non-Acute Institutional Stay (SKILLED_NURSING_FACILITY): Payer: Medicare Other | Admitting: Nurse Practitioner

## 2017-06-15 ENCOUNTER — Encounter: Payer: Self-pay | Admitting: Nurse Practitioner

## 2017-06-15 DIAGNOSIS — K5900 Constipation, unspecified: Secondary | ICD-10-CM

## 2017-06-15 DIAGNOSIS — I480 Paroxysmal atrial fibrillation: Secondary | ICD-10-CM

## 2017-06-15 DIAGNOSIS — G301 Alzheimer's disease with late onset: Secondary | ICD-10-CM

## 2017-06-15 DIAGNOSIS — F0280 Dementia in other diseases classified elsewhere without behavioral disturbance: Secondary | ICD-10-CM | POA: Diagnosis not present

## 2017-06-15 NOTE — Assessment & Plan Note (Signed)
Hx of Afib, heart rate is in control, no rate control agent, continue  Apixaban 5mg  bid for thromboembolic risk reduction.

## 2017-06-15 NOTE — Assessment & Plan Note (Signed)
Hx of dementia, resides in memory care unit [facility], self transfer, ambulates with walker, continue  Memantine, Donepezil for memory.

## 2017-06-15 NOTE — Assessment & Plan Note (Signed)
No constipation, continue MiraLax qd.  

## 2017-06-15 NOTE — Progress Notes (Signed)
Location:  [place] Room Number: 110 Place of Service:  SNF ([phone]) Provider:  Sophie Quiles, Lennie Odor  NP  Blanchie Serve, MD  Patient Care Team: Blanchie Serve, MD as PCP - General (Internal Medicine)  Extended Emergency Contact Information Primary Emergency Contact: [name],[name] Address: [address] [place] [address] [name] of [place] Phone: [phone] Mobile Phone: [phone] Relation: Other  Code Status:  DNR Goals of care: Advanced Directive information Advanced Directives 06/07/2017  Does Patient Have a Medical Advance Directive? Yes  Type of Advance Directive Out of facility DNR (pink MOST or yellow form)  Does patient want to make changes to medical advance directive? No - Patient declined  Copy of [place] in Chart? -  Would patient like information on creating a medical advance directive? -  Pre-existing out of facility DNR order (yellow form or pink MOST form) Yellow form placed in chart (order not valid for inpatient use)     Chief Complaint  Patient presents with  . Medical Management of Chronic Issues    F/u- osteoarthritis, Alzheimers,     HPI:  Pt is a 84 y.o. female seen today for medical management of chronic diseases.     The patient has history of dementia, resides in memory care unit [facility], self transfer, ambulates with walker, on Memantine, Donepezil for memory. Her mood is stable, not taking mood stabilizer. No constipation whil eon MiraLax qd. Hx of Afib, heart rate is in control, no rate control agent, on Apixaban 5mg  bid for thromboembolic risk reduction.    Past Medical History:  Diagnosis Date  . Arthritis   . First degree AV block   . Hypothyroidism   . Hypothyroidism    left thyroid  . Lichen sclerosus   . OA (osteoarthritis)    hands  . Osteoporosis   . Paroxysmal atrial fibrillation (HCC)   . Sick sinus syndrome ([place])   . Vitamin D deficiency    Past  Surgical History:  Procedure Laterality Date  . ABDOMINAL HYSTERECTOMY  1974  . APPENDECTOMY  1951  . EP IMPLANTABLE DEVICE N/A 08/13/2014   Procedure: Pacemaker Implant;  Surgeon: Deboraha Sprang, MD;  Location: [place] CV LAB;  Service: Cardiovascular;  Laterality: N/A;  . HAND SURGERY  20years ago   open dislocation  . TONSILLECTOMY  1942   x2  . TOTAL KNEE ARTHROPLASTY  01/25/2012   Procedure: TOTAL KNEE ARTHROPLASTY;  Surgeon: Gearlean Alf, MD;  Location: [facility];  Service: Orthopedics;  Laterality: Right;    Allergies  Allergen Reactions  . Augmentin [Amoxicillin-Pot Clavulanate] Nausea And Vomiting  . Quinine Derivatives Other (See Comments)    Pt states "im just allergic" unknown reaction  . Penicillins Rash    Outpatient Encounter Medications as of 06/15/2017  Medication Sig  . apixaban (ELIQUIS) 5 MG TABS tablet Take 1 tablet (5 mg total) by mouth 2 (two) times daily.  Marland Kitchen Apoaequorin (PREVAGEN) 10 MG CAPS Take 1 capsule by mouth daily.  . cetaphil (CETAPHIL) cream Apply 1 application topically daily.  . cetirizine (ZYRTEC) 10 MG tablet Take 10 mg by mouth at bedtime.   . cholecalciferol (VITAMIN D) 1000 units tablet Take 1,000 Units by mouth daily.  . feeding supplement (BOOST / RESOURCE BREEZE) LIQD Take 1 Container by mouth 2 (two) times daily between meals.  . Homeopathic Products (MUSCLE THERAPY/ARNICA) GEL Apply 1 application topically at bedtime.  Marland Kitchen  levothyroxine (SYNTHROID, LEVOTHROID) 75 MCG tablet Take 75 mcg by mouth daily before breakfast.  . Lutein-Zeaxanthin 20-1 MG CAPS Take 1 tablet by mouth daily.  . Magnesium 500 MG CAPS Take 1 capsule by mouth daily.  . Memantine HCl-Donepezil HCl (NAMZARIC) 21-10 MG CP24 Take 1 tablet by mouth daily.  Vladimir Faster Glycol-Propyl Glycol (SYSTANE) 0.4-0.3 % SOLN Apply 1 drop to eye 2 (two) times daily. For both eyes.  . polyethylene glycol (MIRALAX / GLYCOLAX) packet Take 17 g by mouth daily.  Marland Kitchen triamcinolone cream  (KENALOG) 0.1 % Apply 1 application topically 2 (two) times daily as needed. Apply to hands and legs  . acetaminophen (TYLENOL) 500 MG tablet Take 500 mg by mouth every 6 (six) hours as needed (PAIN).    No facility-administered encounter medications on file as of 06/15/2017.   ROS was provided with assistance of staff  Review of Systems  Constitutional: Negative for activity change, appetite change, chills, diaphoresis, fatigue and fever.  HENT: Positive for hearing loss. Negative for congestion, trouble swallowing and voice change.   Respiratory: Negative for cough, choking, shortness of breath and wheezing.   Cardiovascular: Negative for chest pain, palpitations and leg swelling.  Gastrointestinal: Negative for abdominal distention, abdominal pain, constipation, diarrhea, nausea and vomiting.  Genitourinary: Negative for difficulty urinating, dysuria and urgency.  Musculoskeletal: Positive for arthralgias and gait problem.       Chronic left knee pain.   Skin: Negative for pallor.       Chronic skin lichen sclerosis,   Neurological: Negative for facial asymmetry, speech difficulty and weakness.       Dementia  Psychiatric/Behavioral: Positive for confusion. Negative for agitation, behavioral problems, hallucinations and sleep disturbance. The patient is not nervous/anxious.     Immunization History  Administered Date(s) Administered  . Influenza-Unspecified 12/04/2015  . PPD Test 08/12/2015, 09/02/2015, 09/04/2015  . Tdap 08/10/2014   Pertinent  Health Maintenance Due  Topic Date Due  . INFLUENZA VACCINE  09/16/2017  . DEXA SCAN  Completed  . PNA vac Low Risk Adult  Completed   Fall Risk  10/16/2016  Falls in the past year? No   Functional Status Survey:    Vitals:   06/15/17 1130  BP: 134/64  Pulse: 66  Resp: 18  Temp: 97.6 F (36.4 C)  SpO2: 96%  Weight: 162 lb 9.6 oz (73.8 kg)  Height: 5\' 7"  (1.702 m)   Body mass index is 25.47 kg/m. Physical Exam    Constitutional: She appears well-developed and well-nourished.  HENT:  Head: Normocephalic and atraumatic.  Eyes: Pupils are equal, round, and reactive to light. EOM are normal.  Neck: Normal range of motion. Neck supple. No JVD present. No thyromegaly present.  Cardiovascular: Normal rate.  No murmur heard. Irregular heart beats  Pulmonary/Chest: Effort normal and breath sounds normal. She has no wheezes. She has no rales.  Abdominal: Soft. Bowel sounds are normal. She exhibits no distension. There is no tenderness.  Musculoskeletal: Normal range of motion. She exhibits no edema.  Ambulates with walker, c/o chronic left knee pain wit walking.  Neurological: She is alert. No cranial nerve deficit. She exhibits normal muscle tone. Coordination normal.  Oriented to person and place.   Skin: Skin is warm and dry.  Chronic lichen sclerosis.   Psychiatric: She has a normal mood and affect. Her behavior is normal.    Labs reviewed: Recent Labs    07/14/16 1626 11/17/16 12/22/16  NA 137 141 140  K 5.0 4.0  4.3  CL 101  --   --   CO2 25  --   --   GLUCOSE 182*  --   --   BUN 24 13 16   CREATININE 0.69 0.8 0.8  CALCIUM 11.7*  --   --    Recent Labs    11/17/16 02/05/17  AST 17 55*  ALT 15 79*  ALKPHOS 56 50   Recent Labs    07/14/16 1626 03/23/17  WBC 10.3 5.4  NEUTROABS 9.2*  --   HGB 14.2 13.3  HCT 43.5 38  MCV 94  --   PLT 203 157   Lab Results  Component Value Date   TSH 1.16 05/06/2017   No results found for: HGBA1C Lab Results  Component Value Date   CHOL 203 (A) 11/17/2016   HDL 39 11/17/2016   LDLCALC 126 11/17/2016   TRIG 236 (A) 11/17/2016    Significant Diagnostic Results in last 30 days:  No results found.  Assessment/Plan Late onset Alzheimer's disease without behavioral disturbance  Hx of dementia, resides in memory care unit [facility], self transfer, ambulates with walker, continue  Memantine, Donepezil for memory.   Paroxysmal A-fib (HCC) Hx of  Afib, heart rate is in control, no rate control agent, continue  Apixaban 5mg  bid for thromboembolic risk reduction.    Constipation No constipation, continue MiraLax qd     Family/ staff Communication: plan of care reviewed with the patient and charge nurse.   Labs/tests ordered:  none  Time spend 25 minutes.

## 2017-07-15 ENCOUNTER — Non-Acute Institutional Stay (SKILLED_NURSING_FACILITY): Payer: Medicare Other | Admitting: Internal Medicine

## 2017-07-15 ENCOUNTER — Encounter: Payer: Self-pay | Admitting: Internal Medicine

## 2017-07-15 DIAGNOSIS — F0280 Dementia in other diseases classified elsewhere without behavioral disturbance: Secondary | ICD-10-CM

## 2017-07-15 DIAGNOSIS — G301 Alzheimer's disease with late onset: Secondary | ICD-10-CM

## 2017-07-15 DIAGNOSIS — E039 Hypothyroidism, unspecified: Secondary | ICD-10-CM

## 2017-07-15 DIAGNOSIS — I480 Paroxysmal atrial fibrillation: Secondary | ICD-10-CM | POA: Diagnosis not present

## 2017-07-15 DIAGNOSIS — J309 Allergic rhinitis, unspecified: Secondary | ICD-10-CM | POA: Diagnosis not present

## 2017-07-15 NOTE — Progress Notes (Signed)
Location:  [place] Room Number: 110 Place of Service:  SNF [phone]) Provider:  Blanchie Serve MD  Blanchie Serve, MD  Patient Care Team: Blanchie Serve, MD as PCP - General (Internal Medicine)  Extended Emergency Contact Information Primary Emergency Contact: [name],[name] Address: [address] [place] [address] [name] of [place] Phone: [phone] Mobile Phone: [phone] Relation: Other  Code Status:  DNR  Goals of care: Advanced Directive information Advanced Directives 07/15/2017  Does Patient Have a Medical Advance Directive? Yes  Type of Advance Directive Out of facility DNR (pink MOST or yellow form)  Does patient want to make changes to medical advance directive? No - Patient declined  Copy of [place] in Chart? -  Would patient like information on creating a medical advance directive? -  Pre-existing out of facility DNR order (yellow form or pink MOST form) Yellow form placed in chart (order not valid for inpatient use)     Chief Complaint  Patient presents with  . Medical Management of Chronic Issues    Routine Visit     HPI:  Pt is a 84 y.o. female seen today for medical management of chronic diseases.   Hypothyroidism- takes levothyroxine 75 mcg daily. Reviewed TSH  Allergic rhinitis- denies any symptom, on prn cetirizine  Dementia- in memory care, on namzaric 21-10 mg daily, recieves supportive care, ambulates without assistive device, feeds herself.    Past Medical History:  Diagnosis Date  . Arthritis   . First degree AV block   . Hypothyroidism   . Hypothyroidism    left thyroid  . Lichen sclerosus   . OA (osteoarthritis)    hands  . Osteoporosis   . Paroxysmal atrial fibrillation (HCC)   . Sick sinus syndrome ([place])   . Vitamin D deficiency    Past Surgical History:  Procedure Laterality Date  . ABDOMINAL HYSTERECTOMY  1974  . APPENDECTOMY  1951  . EP  IMPLANTABLE DEVICE N/A 08/13/2014   Procedure: Pacemaker Implant;  Surgeon: Deboraha Sprang, MD;  Location: [place] CV LAB;  Service: Cardiovascular;  Laterality: N/A;  . HAND SURGERY  20years ago   open dislocation  . TONSILLECTOMY  1942   x2  . TOTAL KNEE ARTHROPLASTY  01/25/2012   Procedure: TOTAL KNEE ARTHROPLASTY;  Surgeon: Gearlean Alf, MD;  [facility];  Service: Orthopedics;  Laterality: Right;    Allergies  Allergen Reactions  . Augmentin [Amoxicillin-Pot Clavulanate] Nausea And Vomiting  . Quinine Derivatives Other (See Comments)    Pt states "im just allergic" unknown reaction  . Penicillins Rash    Outpatient Encounter Medications as of 07/15/2017  Medication Sig  . acetaminophen (TYLENOL) 500 MG tablet Take 500 mg by mouth every 6 (six) hours as needed (PAIN).   Marland Kitchen apixaban (ELIQUIS) 5 MG TABS tablet Take 1 tablet (5 mg total) by mouth 2 (two) times daily.  Marland Kitchen Apoaequorin (PREVAGEN) 10 MG CAPS Take 1 capsule by mouth daily.  . cetaphil (CETAPHIL) cream Apply 1 application topically daily.  . cetirizine (ZYRTEC) 10 MG tablet Take 10 mg by mouth daily as needed.   . cholecalciferol (VITAMIN D) 1000 units tablet Take 1,000 Units by mouth daily.  . feeding supplement (BOOST / RESOURCE BREEZE) LIQD Take 1 Container by mouth 2 (two) times daily between meals.  . Homeopathic Products (MUSCLE THERAPY/ARNICA) GEL Apply 1 application topically at bedtime.  Marland Kitchen  levothyroxine (SYNTHROID, LEVOTHROID) 75 MCG tablet Take 75 mcg by mouth daily before breakfast.  . Lutein-Zeaxanthin 20-1 MG CAPS Take 1 tablet by mouth daily.  . Magnesium 500 MG CAPS Take 1 capsule by mouth daily.  . Memantine HCl-Donepezil HCl (NAMZARIC) 21-10 MG CP24 Take 1 tablet by mouth daily.  Vladimir Faster Glycol-Propyl Glycol (SYSTANE) 0.4-0.3 % SOLN Apply 1 drop to eye 2 (two) times daily. For both eyes.  . polyethylene glycol (MIRALAX / GLYCOLAX) packet Take 17 g by mouth daily.  Marland Kitchen triamcinolone cream  (KENALOG) 0.1 % Apply 1 application topically 2 (two) times daily as needed. Apply to hands and legs   No facility-administered encounter medications on file as of 07/15/2017.     Review of Systems  Constitutional: Negative for appetite change and fever.  HENT: Negative for congestion, postnasal drip, rhinorrhea and sinus pressure.   Respiratory: Negative for cough and shortness of breath.   Cardiovascular: Negative for chest pain and palpitations.  Gastrointestinal: Negative for abdominal pain, constipation, diarrhea, nausea and vomiting.  Genitourinary: Negative for dysuria.  Musculoskeletal: Negative for back pain and gait problem.  Skin: Negative for rash.  Neurological: Negative for dizziness and headaches.  Psychiatric/Behavioral: Positive for confusion.    Immunization History  Administered Date(s) Administered  . Influenza-Unspecified 12/04/2015  . PPD Test 08/12/2015, 09/02/2015, 09/04/2015  . Tdap 08/10/2014   Pertinent  Health Maintenance Due  Topic Date Due  . INFLUENZA VACCINE  09/16/2017  . DEXA SCAN  Completed  . PNA vac Low Risk Adult  Completed   Fall Risk  10/16/2016  Falls in the past year? No   Functional Status Survey:    Vitals:   07/15/17 1519  BP: 130/70  Pulse: 64  Resp: 20  Temp: 97.9 F (36.6 C)  TempSrc: Oral  SpO2: 96%  Weight: 166 lb (75.3 kg)  Height: 5' 6"  (1.676 m)   Body mass index is 26.79 kg/m.   Wt Readings from Last 3 Encounters:  07/15/17 166 lb (75.3 kg)  06/15/17 162 lb 9.6 oz (73.8 kg)  06/07/17 162 lb 9.6 oz (73.8 kg)   Physical Exam  Constitutional: She appears well-developed and well-nourished. No distress.  HENT:  Head: Normocephalic and atraumatic.  Nose: Nose normal.  Mouth/Throat: Oropharynx is clear and moist. No oropharyngeal exudate.  Eyes: Pupils are equal, round, and reactive to light. Conjunctivae are normal. Right eye exhibits no discharge. Left eye exhibits no discharge.  Neck: Normal range of  motion. Neck supple.  Cardiovascular: Normal rate and regular rhythm.  Pulmonary/Chest: Effort normal and breath sounds normal. No respiratory distress. She has no wheezes. She has no rales.  Abdominal: Soft. Bowel sounds are normal. There is no tenderness.  Musculoskeletal: Normal range of motion. She exhibits no edema or tenderness.  Lymphadenopathy:    She has no cervical adenopathy.  Neurological: She is alert.  Oriented to self  Skin: Skin is warm and dry. She is not diaphoretic.  Psychiatric: She has a normal mood and affect.    Labs reviewed: Recent Labs    11/17/16 12/22/16  NA 141 140  K 4.0 4.3  BUN 13 16  CREATININE 0.8 0.8   Recent Labs    11/17/16 02/05/17  AST 17 55*  ALT 15 79*  ALKPHOS 56 50   Recent Labs    03/23/17  WBC 5.4  HGB 13.3  HCT 38  PLT 157   Lab Results  Component Value Date   TSH 1.16 05/06/2017  No results found for: HGBA1C Lab Results  Component Value Date   CHOL 203 (A) 11/17/2016   HDL 39 11/17/2016   LDLCALC 126 11/17/2016   TRIG 236 (A) 11/17/2016    Significant Diagnostic Results in last 30 days:  No results found.  Assessment/Plan  1. Paroxysmal A-fib (HCC) Controlled HR. Continue apixaban for long term anticoagulation for stroke prevention.   2. Allergic rhinitis, unspecified seasonality, unspecified trigger Stable. No symptoms of allergies at present, d/c cetirizine for now.   3. Late onset Alzheimer's disease without behavioral disturbance Monitor for behavior changes. Continue namzaric and monitor  4. Hypothyroidism, unspecified type Lab Results  Component Value Date   TSH 1.16 05/06/2017   Stable, continue levothyroxine 75 mcg daily and monitor     Family/ staff Communication: reviewed care plan with patient and charge nurse.    Labs/tests ordered:  cmp with eGFR   Blanchie Serve, MD Internal Medicine [facility] [address] [place], [place] [address] Cell  Phone (Monday-Friday 8 am - 5 pm): [phone] On Call: [phone] and follow prompts after 5 pm and on weekends Office Phone: [phone] Office Fax: [phone]

## 2017-07-20 ENCOUNTER — Encounter: Payer: Self-pay | Admitting: Nurse Practitioner

## 2017-07-20 ENCOUNTER — Non-Acute Institutional Stay (SKILLED_NURSING_FACILITY): Payer: Medicare Other | Admitting: Nurse Practitioner

## 2017-07-20 DIAGNOSIS — F0280 Dementia in other diseases classified elsewhere without behavioral disturbance: Secondary | ICD-10-CM

## 2017-07-20 DIAGNOSIS — G301 Alzheimer's disease with late onset: Secondary | ICD-10-CM | POA: Diagnosis not present

## 2017-07-20 DIAGNOSIS — L089 Local infection of the skin and subcutaneous tissue, unspecified: Secondary | ICD-10-CM | POA: Insufficient documentation

## 2017-07-20 DIAGNOSIS — T148XXA Other injury of unspecified body region, initial encounter: Secondary | ICD-10-CM

## 2017-07-20 NOTE — Assessment & Plan Note (Signed)
a open wound at right shin sustained from ruptured hematoma occurred 07/13/17. Mild erythema and warmth peri-wound site, serosanguinous drainage with no apparent odor. Will apply Bactroban ointment daily x 10 days, cover with protective dressing. Observe.

## 2017-07-20 NOTE — Assessment & Plan Note (Signed)
Chronic, serum Ca 10-11 in the past, today's Ca 11.0. Dc Vit D supplement, f/u BMP in one month.

## 2017-07-20 NOTE — Progress Notes (Signed)
Location:  [place] Room Number: 110 Place of Service:  SNF [phone]) Provider:  Faline Langer, Lennie Odor  NP  Blanchie Serve, MD  Patient Care Team: Blanchie Serve, MD as PCP - General (Internal Medicine)  Extended Emergency Contact Information Primary Emergency Contact: [name],[name] Address: [address] [place] [address] [name] of [place] Phone: [phone] Mobile Phone: [phone] Relation: Other  Code Status:  DNr Goals of care: Advanced Directive information Advanced Directives 07/20/2017  Does Patient Have a Medical Advance Directive? Yes  Type of Advance Directive Out of facility DNR (pink MOST or yellow form)  Does patient want to make changes to medical advance directive? No - Patient declined  Copy of [place] in Chart? -  Would patient like information on creating a medical advance directive? -  Pre-existing out of facility DNR order (yellow form or pink MOST form) Yellow form placed in chart (order not valid for inpatient use)     Chief Complaint  Patient presents with  . Acute Visit    Non-healing skin tear on (R) shin    HPI:  Pt is a 84 y.o. female seen today for an acute visit for a open wound at right shin sustained from ruptured hematoma occurred 07/13/17. Mild erythema and warmth peri-wound site, serosanguinous drainage with no apparent odor. HPI was provided with assistance of staff due to her dementia. She resides in memory care unit [facility], self transfer, ambulates with walker.    Past Medical History:  Diagnosis Date  . Arthritis   . First degree AV block   . Hypothyroidism   . Hypothyroidism    left thyroid  . Lichen sclerosus   . OA (osteoarthritis)    hands  . Osteoporosis   . Paroxysmal atrial fibrillation (HCC)   . Sick sinus syndrome ([place])   . Vitamin D deficiency    Past Surgical History:  Procedure Laterality Date  . ABDOMINAL HYSTERECTOMY  1974  . APPENDECTOMY   1951  . EP IMPLANTABLE DEVICE N/A 08/13/2014   Procedure: Pacemaker Implant;  Surgeon: Deboraha Sprang, MD;  Location: [place] CV LAB;  Service: Cardiovascular;  Laterality: N/A;  . HAND SURGERY  20years ago   open dislocation  . TONSILLECTOMY  1942   x2  . TOTAL KNEE ARTHROPLASTY  01/25/2012   Procedure: TOTAL KNEE ARTHROPLASTY;  Surgeon: Gearlean Alf, MD;  [facility];  Service: Orthopedics;  Laterality: Right;    Allergies  Allergen Reactions  . Augmentin [Amoxicillin-Pot Clavulanate] Nausea And Vomiting  . Quinine Derivatives Other (See Comments)    Pt states "im just allergic" unknown reaction  . Penicillins Rash    Outpatient Encounter Medications as of 07/20/2017  Medication Sig  . acetaminophen (TYLENOL) 500 MG tablet Take 500 mg by mouth every 6 (six) hours as needed (PAIN).   Marland Kitchen apixaban (ELIQUIS) 5 MG TABS tablet Take 1 tablet (5 mg total) by mouth 2 (two) times daily.  Marland Kitchen Apoaequorin (PREVAGEN) 10 MG CAPS Take 1 capsule by mouth daily.  . cetaphil (CETAPHIL) cream Apply 1 application topically daily.  . cholecalciferol (VITAMIN D) 1000 units tablet Take 1,000 Units by mouth daily.  . feeding supplement (BOOST / RESOURCE BREEZE) LIQD Take 1 Container by mouth 2 (two) times daily between meals.  . Homeopathic Products (MUSCLE THERAPY/ARNICA) GEL Apply 1 application topically at bedtime.  Marland Kitchen levothyroxine (SYNTHROID, LEVOTHROID) 75 MCG tablet Take 75  mcg by mouth daily before breakfast.  . Lutein-Zeaxanthin 20-1 MG CAPS Take 1 tablet by mouth daily.  . Magnesium 500 MG CAPS Take 1 capsule by mouth daily.  . Memantine HCl-Donepezil HCl (NAMZARIC) 21-10 MG CP24 Take 1 tablet by mouth daily.  Vladimir Faster Glycol-Propyl Glycol (SYSTANE) 0.4-0.3 % SOLN Apply 1 drop to eye 2 (two) times daily. For both eyes.  . polyethylene glycol (MIRALAX / GLYCOLAX) packet Take 17 g by mouth daily.  Marland Kitchen triamcinolone cream (KENALOG) 0.1 % Apply 1 application topically 2 (two) times daily as  needed. Apply to hands and legs  . [DISCONTINUED] cetirizine (ZYRTEC) 10 MG tablet Take 10 mg by mouth daily as needed.    No facility-administered encounter medications on file as of 07/20/2017.    ROS was provided with assistance of staff Review of Systems  Constitutional: Negative for activity change, appetite change, chills, diaphoresis, fatigue and fever.  HENT: Positive for hearing loss. Negative for trouble swallowing and voice change.   Respiratory: Negative for cough, shortness of breath and wheezing.   Cardiovascular: Positive for leg swelling. Negative for chest pain and palpitations.  Musculoskeletal: Positive for arthralgias and gait problem.       Chronic left knee pain  Skin: Positive for wound.       Lichen sclerosus. Right shin open wound.   Neurological: Negative for speech difficulty, weakness and headaches.       Dementia  Psychiatric/Behavioral: Positive for confusion. Negative for agitation and behavioral problems. The patient is nervous/anxious.     Immunization History  Administered Date(s) Administered  . Influenza-Unspecified 12/04/2015  . PPD Test 08/12/2015, 09/02/2015, 09/04/2015  . Tdap 08/10/2014   Pertinent  Health Maintenance Due  Topic Date Due  . INFLUENZA VACCINE  09/16/2017  . DEXA SCAN  Completed  . PNA vac Low Risk Adult  Completed   Fall Risk  10/16/2016  Falls in the past year? No   Functional Status Survey:    Vitals:   07/20/17 1157  BP: 118/65  Pulse: 60  Resp: 18  Temp: 97.6 F (36.4 C)  Weight: 168 lb (76.2 kg)  Height: 5\' 7"  (1.702 m)   Body mass index is 26.31 kg/m. Physical Exam  Constitutional: She appears well-developed and well-nourished.  HENT:  Head: Normocephalic and atraumatic.  Neck: Normal range of motion. Neck supple. No thyromegaly present.  Cardiovascular: Normal rate.  No murmur heard. Irregular heart beats.   Pulmonary/Chest: She has no wheezes. She has no rales.  Musculoskeletal: She exhibits  edema.  Chronic left knee pain. RLE trace edema  Neurological: She is alert. No cranial nerve deficit. She exhibits normal muscle tone. Coordination normal.  Oriented to person and her room on unit.   Skin: Skin is warm and dry.  Chronic lichen sclerosus. Right shin open wound from previous ruptured hematoma. Size about 2-3cm, serosanguinous drainage, peri wound mild erythema and warmth, no apparent odor noted.   Psychiatric: She has a normal mood and affect. Her behavior is normal.    Labs reviewed: Recent Labs    11/17/16 12/22/16  NA 141 140  K 4.0 4.3  BUN 13 16  CREATININE 0.8 0.8   Recent Labs    11/17/16 02/05/17  AST 17 55*  ALT 15 79*  ALKPHOS 56 50   Recent Labs    03/23/17  WBC 5.4  HGB 13.3  HCT 38  PLT 157   Lab Results  Component Value Date   TSH 1.16 05/06/2017  No results found for: HGBA1C Lab Results  Component Value Date   CHOL 203 (A) 11/17/2016   HDL 39 11/17/2016   LDLCALC 126 11/17/2016   TRIG 236 (A) 11/17/2016    Significant Diagnostic Results in last 30 days:  No results found.  Assessment/Plan Infected open wound a open wound at right shin sustained from ruptured hematoma occurred 07/13/17. Mild erythema and warmth peri-wound site, serosanguinous drainage with no apparent odor. Will apply Bactroban ointment daily x 10 days, cover with protective dressing. Observe.   Late onset Alzheimer's disease without behavioral disturbance HPI was provided with assistance of staff due to her dementia. She resides in memory care unit [facility], self transfer, ambulates with walker. Continue Memantine and Donepezil for memory. Close supervision for safety due to her lack of safety awareness related to dementia.    Hypercalcemia Chronic, serum Ca 10-11 in the past, today's Ca 11.0. Dc Vit D supplement, f/u BMP in one month.      Family/ staff Communication: plan of care reviewed with the patient and charge nurse.   Labs/tests ordered: BMP one month.     Time spend 25 minutes.

## 2017-07-20 NOTE — Assessment & Plan Note (Signed)
HPI was provided with assistance of staff due to her dementia. She resides in memory care unit [facility], self transfer, ambulates with walker. Continue Memantine and Donepezil for memory. Close supervision for safety due to her lack of safety awareness related to dementia.

## 2017-08-10 ENCOUNTER — Encounter: Payer: Self-pay | Admitting: Nurse Practitioner

## 2017-08-10 ENCOUNTER — Non-Acute Institutional Stay (SKILLED_NURSING_FACILITY): Payer: Medicare Other | Admitting: Nurse Practitioner

## 2017-08-10 DIAGNOSIS — M810 Age-related osteoporosis without current pathological fracture: Secondary | ICD-10-CM | POA: Diagnosis not present

## 2017-08-10 DIAGNOSIS — G301 Alzheimer's disease with late onset: Secondary | ICD-10-CM

## 2017-08-10 DIAGNOSIS — E039 Hypothyroidism, unspecified: Secondary | ICD-10-CM

## 2017-08-10 DIAGNOSIS — F0280 Dementia in other diseases classified elsewhere without behavioral disturbance: Secondary | ICD-10-CM | POA: Diagnosis not present

## 2017-08-10 DIAGNOSIS — K5900 Constipation, unspecified: Secondary | ICD-10-CM

## 2017-08-10 DIAGNOSIS — I480 Paroxysmal atrial fibrillation: Secondary | ICD-10-CM

## 2017-08-10 DIAGNOSIS — Z7901 Long term (current) use of anticoagulants: Secondary | ICD-10-CM

## 2017-08-10 NOTE — Assessment & Plan Note (Signed)
Stable, continue memory care unit for care needs, continue Namzaric daily for memory.

## 2017-08-10 NOTE — Assessment & Plan Note (Signed)
Stable, continue Apixaban 5mg  bid.

## 2017-08-10 NOTE — Assessment & Plan Note (Signed)
Persist elevated serum Ca 11.0 07/20/17, will obtain serum Mg, Ca, Ionized Ca, PTH, and Vit D level.

## 2017-08-10 NOTE — Assessment & Plan Note (Signed)
Stable, continue Vit D, Mg supplement.

## 2017-08-10 NOTE — Assessment & Plan Note (Signed)
Heart rate is in control.  

## 2017-08-10 NOTE — Progress Notes (Signed)
Location:  [place] Room Number: 110 Place of Service:  SNF ([phone]) Provider:  Ajene Carchi, Lennie Odor  NP  Blanchie Serve, MD  Patient Care Team: Blanchie Serve, MD as PCP - General (Internal Medicine)  Extended Emergency Contact Information Primary Emergency Contact: [name],[name] Address: [address] [place] [address] Kristine Holden of [place] Phone: [phone] Mobile Phone: [phone] Relation: Other  Code Status:  DNR Goals of care: Advanced Directive information Advanced Directives 07/20/2017  Does Patient Have a Medical Advance Directive? Yes  Type of Advance Directive Out of facility DNR (pink MOST or yellow form)  Does patient want to make changes to medical advance directive? No - Patient declined  Copy of [place] in Chart? -  Would patient like information on creating a medical advance directive? -  Pre-existing out of facility DNR order (yellow form or pink MOST form) Yellow form placed in chart (order not valid for inpatient use)     Chief Complaint  Patient presents with  . Medical Management of Chronic Issues    F/U- Alzheimers, infected wound, hyperkalcemia,    HPI:  Pt is a 84 y.o. female seen today for medical management of chronic diseases.     The patient has elevated hypercalcemia, last Ca 11.0 07/20/17, off Ca, Fosamax, remind on Vit D 1000u daily. She denied excessive thirst, frequent urination, stomach pain, GI symptoms, bone pain, muscle weakness, change of mood, or lethargy. No constipation while on MiraLax qd. Her memory is preserved on Namzaric daily. Osteoporosis, no recent fx, on Vit D, Mg daily. Hypothyroidism, on Levothyroxine 75mcg qd, last TSH 1.16 05/06/17.  Afib heart rate is in control, on Apixaban 5mg  bid for thromboembolic risk reduction.     Past Medical History:  Diagnosis Date  . Arthritis   . First degree AV block   . Hypothyroidism   . Hypothyroidism    left  thyroid  . Lichen sclerosus   . OA (osteoarthritis)    hands  . Osteoporosis   . Paroxysmal atrial fibrillation (HCC)   . Sick sinus syndrome ([place])   . Vitamin D deficiency    Past Surgical History:  Procedure Laterality Date  . ABDOMINAL HYSTERECTOMY  1974  . APPENDECTOMY  1951  . EP IMPLANTABLE DEVICE N/A 08/13/2014   Procedure: Pacemaker Implant;  Surgeon: Deboraha Sprang, MD;  Location: [place] [facility];  Service: Cardiovascular;  Laterality: N/A;  . HAND SURGERY  20years ago   open dislocation  . TONSILLECTOMY  1942   x2  . TOTAL KNEE ARTHROPLASTY  01/25/2012   Procedure: TOTAL KNEE ARTHROPLASTY;  Surgeon: Gearlean Alf, MD;  Location: [facility];  Service: Orthopedics;  Laterality: Right;    Allergies  Allergen Reactions  . Augmentin [Amoxicillin-Pot Clavulanate] Nausea And Vomiting  . Quinine Derivatives Other (See Comments)    Pt states "im just allergic" unknown reaction  . Penicillins Rash    Outpatient Encounter Medications as of 08/10/2017  Medication Sig  . acetaminophen (TYLENOL) 500 MG tablet Take 500 mg by mouth every 6 (six) hours as needed (PAIN).   Marland Kitchen apixaban (ELIQUIS) 5 MG TABS tablet Take 1 tablet (5 mg total) by mouth 2 (two) times daily.  Marland Kitchen Apoaequorin (PREVAGEN) 10 MG CAPS Take 1 capsule by mouth daily.  . cetaphil (CETAPHIL) cream Apply 1 application topically daily.  . cholecalciferol (VITAMIN D) 1000 units tablet Take 1,000 Units by mouth  daily.  . feeding supplement (BOOST / RESOURCE BREEZE) LIQD Take 1 Container by mouth 2 (two) times daily between meals.  . Homeopathic Products (MUSCLE THERAPY/ARNICA) GEL Apply 1 application topically at bedtime.  Marland Kitchen levothyroxine (SYNTHROID, LEVOTHROID) 75 MCG tablet Take 75 mcg by mouth daily before breakfast.  . Lutein-Zeaxanthin 20-1 MG CAPS Take 1 tablet by mouth daily.  . Magnesium 500 MG CAPS Take 1 capsule by mouth daily.  . Memantine HCl-Donepezil HCl (NAMZARIC) 21-10 MG CP24 Take 1 tablet by mouth daily.    Vladimir Faster Glycol-Propyl Glycol (SYSTANE) 0.4-0.3 % SOLN Apply 1 drop to eye 2 (two) times daily. For both eyes.  . polyethylene glycol (MIRALAX / GLYCOLAX) packet Take 17 g by mouth daily.  Marland Kitchen triamcinolone cream (KENALOG) 0.1 % Apply 1 application topically 2 (two) times daily as needed. Apply to hands and legs   No facility-administered encounter medications on file as of 08/10/2017.     Review of Systems  Constitutional: Negative for activity change, appetite change, chills, diaphoresis, fatigue and fever.  HENT: Positive for hearing loss. Negative for congestion, trouble swallowing and voice change.   Respiratory: Negative for cough, shortness of breath and wheezing.   Cardiovascular: Negative for chest pain, palpitations and leg swelling.  Gastrointestinal: Negative for abdominal distention, abdominal pain, constipation, diarrhea, nausea and vomiting.  Genitourinary: Negative for difficulty urinating, dysuria and urgency.  Musculoskeletal: Positive for arthralgias and gait problem.       Chronic left knee pain  Skin: Negative for color change and pallor.  Neurological: Negative for dizziness, speech difficulty, weakness and headaches.       Dementia  Psychiatric/Behavioral: Positive for confusion. Negative for agitation, behavioral problems, hallucinations and sleep disturbance. The patient is not nervous/anxious.     Immunization History  Administered Date(s) Administered  . Influenza-Unspecified 12/04/2015  . PPD Test 08/12/2015, 09/02/2015, 09/04/2015  . Tdap 08/10/2014   Pertinent  Health Maintenance Due  Topic Date Due  . INFLUENZA VACCINE  09/16/2017  . DEXA SCAN  Completed  . PNA vac Low Risk Adult  Completed   Fall Risk  10/16/2016  Falls in the past year? No   Functional Status Survey:    Vitals:   08/10/17 1123  BP: 132/70  Pulse: 64  Resp: 18  Temp: 97.6 F (36.4 C)  Weight: 171 lb (77.6 kg)  Height: 5\' 7"  (1.702 m)   Body mass index is 26.78  kg/m. Physical Exam  Constitutional: She appears well-developed and well-nourished.  HENT:  Head: Normocephalic and atraumatic.  Eyes: Pupils are equal, round, and reactive to light. EOM are normal.  Neck: Normal range of motion. Neck supple. No JVD present. No thyromegaly present.  Cardiovascular: Normal rate.  No murmur heard. Irregular heart beats.   Pulmonary/Chest: Effort normal. She has no wheezes. She has no rales.  Abdominal: Soft. Bowel sounds are normal. She exhibits no distension. There is no tenderness.  Musculoskeletal: She exhibits tenderness. She exhibits no edema.  Chronic left knee pain worsens with walking. Self transfers and toilets. Ambulates with walker.   Neurological: She is alert. No cranial nerve deficit. Coordination normal.  Oriented to person and place.   Skin: Skin is warm and dry.  A scab from previous wound noted right mid shin.   Psychiatric: She has a normal mood and affect. Her behavior is normal.    Labs reviewed: Recent Labs    11/17/16 12/22/16  NA 141 140  K 4.0 4.3  BUN 13 16  CREATININE  0.8 0.8   Recent Labs    11/17/16 02/05/17  AST 17 55*  ALT 15 79*  ALKPHOS 56 50   Recent Labs    03/23/17  WBC 5.4  HGB 13.3  HCT 38  PLT 157   Lab Results  Component Value Date   TSH 1.16 05/06/2017   No results found for: HGBA1C Lab Results  Component Value Date   CHOL 203 (A) 11/17/2016   HDL 39 11/17/2016   LDLCALC 126 11/17/2016   TRIG 236 (A) 11/17/2016    Significant Diagnostic Results in last 30 days:  No results found.  Assessment/Plan Hypercalcemia Persist elevated serum Ca 11.0 07/20/17, will obtain serum Mg, Ca, Ionized Ca, PTH, and Vit D level.   Constipation Stable, cotninue MiraLax daily.   Chronic anticoagulation Stable, continue Apixaban 5mg  bid.   Osteoporosis without current pathological fracture Stable, continue Vit D, Mg supplement.   Late onset Alzheimer's disease without behavioral  disturbance Stable, continue memory care unit for care needs, continue Namzaric daily for memory.   Hypothyroidism Stable, continue Levothyroxine 57mcg daily. Update TSH, CBC  Paroxysmal A-fib (HCC) Heart rate is in control     Family/ staff Communication: plan of care reviewed with the patient and charge nurse.  Labs/tests ordered:  Mg, Ca, Ionized Ca, Vit D, PTH, TSH, CBC  Time spend 25 minutes.

## 2017-08-10 NOTE — Assessment & Plan Note (Addendum)
Stable, continue Levothyroxine 80mcg daily. Update TSH, CBC

## 2017-08-10 NOTE — Assessment & Plan Note (Signed)
Stable, cotninue MiraLax daily.

## 2017-08-11 ENCOUNTER — Encounter: Payer: Self-pay | Admitting: Nurse Practitioner

## 2017-08-12 LAB — CBC AND DIFFERENTIAL
HEMATOCRIT: 41 (ref 36–46)
HEMOGLOBIN: 13.9 (ref 12.0–16.0)
PLATELETS: 152 (ref 150–399)
WBC: 4.7

## 2017-08-12 LAB — TSH: TSH: 1.71 (ref <=5.90)

## 2017-08-18 ENCOUNTER — Other Ambulatory Visit: Payer: Self-pay | Admitting: *Deleted

## 2017-08-18 LAB — CALCIUM
Calcium, Ion: 6.1
Calcium: 10.5
Vitamin D, 25-OH, D2: <4
Vitamin D, 25-OH, D3: 39
Vitamin D, 25-OH, Total: 39

## 2017-08-26 LAB — PTH, INTACT AND CALCIUM
Calcium: 10.5
PTH: 164

## 2017-09-02 ENCOUNTER — Encounter: Payer: Self-pay | Admitting: Cardiology

## 2017-09-13 ENCOUNTER — Non-Acute Institutional Stay (SKILLED_NURSING_FACILITY): Payer: Medicare Other | Admitting: Internal Medicine

## 2017-09-13 ENCOUNTER — Encounter: Payer: Self-pay | Admitting: Internal Medicine

## 2017-09-13 DIAGNOSIS — M1712 Unilateral primary osteoarthritis, left knee: Secondary | ICD-10-CM | POA: Diagnosis not present

## 2017-09-13 DIAGNOSIS — K5909 Other constipation: Secondary | ICD-10-CM | POA: Diagnosis not present

## 2017-09-13 DIAGNOSIS — I480 Paroxysmal atrial fibrillation: Secondary | ICD-10-CM | POA: Diagnosis not present

## 2017-09-13 DIAGNOSIS — E039 Hypothyroidism, unspecified: Secondary | ICD-10-CM

## 2017-09-13 NOTE — Progress Notes (Signed)
Location:  [facility] Room Number: 110 Place of Service:  SNF [phone]) Provider:  Blanchie Serve MD  Blanchie Serve, MD  Patient Care Team: Blanchie Serve, MD as PCP - General (Internal Medicine)  Extended Emergency Contact Information Primary Emergency Contact: [name],[name] Address: [address] [place] [address] [name] of [place] Phone: [phone] Mobile Phone: [phone] Relation: Other  Code Status:  DNR  Goals of care: Advanced Directive information Advanced Directives 09/13/2017  Does Patient Have a Medical Advance Directive? Yes  Type of Advance Directive Out of facility DNR (pink MOST or yellow form)  Does patient want to make changes to medical advance directive? No - Patient declined  Copy of [place] in Chart? -  Would patient like information on creating a medical advance directive? -  Pre-existing out of facility DNR order (yellow form or pink MOST form) Yellow form placed in chart (order not valid for inpatient use)     Chief Complaint  Patient presents with  . Medical Management of Chronic Issues    Routine Visit     HPI:  Pt is a 84 y.o. female seen today for medical management of chronic diseases. She resides in memory care. She denies any concern. No acute concern from nursing. She takes her medications. She feeds herself. No fall reported. Tolerating levothyroxine well. Takes her protein supplement.    Past Medical History:  Diagnosis Date  . Arthritis   . First degree AV block   . Hypothyroidism   . Hypothyroidism    left thyroid  . Lichen sclerosus   . OA (osteoarthritis)    hands  . Osteoporosis   . Paroxysmal atrial fibrillation (HCC)   . Sick sinus syndrome ([place])   . Vitamin D deficiency    Past Surgical History:  Procedure Laterality Date  . ABDOMINAL HYSTERECTOMY  1974  . APPENDECTOMY  1951  . EP IMPLANTABLE DEVICE N/A 08/13/2014   Procedure: Pacemaker  Implant;  Surgeon: Deboraha Sprang, MD;  Location: [place] [facility];  Service: Cardiovascular;  Laterality: N/A;  . HAND SURGERY  20years ago   open dislocation  . TONSILLECTOMY  1942   x2  . TOTAL KNEE ARTHROPLASTY  01/25/2012   Procedure: TOTAL KNEE ARTHROPLASTY;  Surgeon: Gearlean Alf, MD;  Location: [facility];  Service: Orthopedics;  Laterality: Right;    Allergies  Allergen Reactions  . Augmentin [Amoxicillin-Pot Clavulanate] Nausea And Vomiting  . Quinine Derivatives Other (See Comments)    Pt states "im just allergic" unknown reaction  . Penicillins Rash    Outpatient Encounter Medications as of 09/13/2017  Medication Sig  . acetaminophen (TYLENOL) 500 MG tablet Take 500 mg by mouth every 6 (six) hours as needed (PAIN).   Marland Kitchen apixaban (ELIQUIS) 5 MG TABS tablet Take 1 tablet (5 mg total) by mouth 2 (two) times daily.  Marland Kitchen Apoaequorin (PREVAGEN) 10 MG CAPS Take 1 capsule by mouth daily.  . feeding supplement (BOOST / RESOURCE BREEZE) LIQD Take 1 Container by mouth daily.   . Homeopathic Products (MUSCLE THERAPY/ARNICA) GEL Apply 1 application topically at bedtime.  Marland Kitchen levothyroxine (SYNTHROID, LEVOTHROID) 75 MCG tablet Take 75 mcg by mouth daily before breakfast.  . Lutein-Zeaxanthin 20-1 MG CAPS Take 1 tablet by mouth daily.  . Magnesium 500 MG CAPS Take 1 capsule by mouth daily.  . Memantine HCl-Donepezil HCl (NAMZARIC) 21-10 MG CP24 Take 1 tablet by  mouth daily.  Vladimir Faster Glycol-Propyl Glycol (SYSTANE) 0.4-0.3 % SOLN Apply 1 drop to eye 2 (two) times daily. For both eyes.  . polyethylene glycol (MIRALAX / GLYCOLAX) packet Take 17 g by mouth daily.  Marland Kitchen triamcinolone cream (KENALOG) 0.1 % Apply 1 application topically 2 (two) times daily as needed. Apply to hands and legs  . [DISCONTINUED] cetaphil (CETAPHIL) cream Apply 1 application topically daily.  . [DISCONTINUED] cholecalciferol (VITAMIN D) 1000 units tablet Take 1,000 Units by mouth daily.   No facility-administered  encounter medications on file as of 09/13/2017.     Review of Systems  Unable to perform ROS: Dementia  Constitutional: Negative for appetite change and fever.  HENT: Negative for mouth sores.   Respiratory: Negative for cough and shortness of breath.   Cardiovascular: Negative for chest pain.  Gastrointestinal: Negative for abdominal pain, diarrhea and vomiting.  Genitourinary: Negative for dysuria.  Neurological: Negative for dizziness and headaches.    Immunization History  Administered Date(s) Administered  . Influenza-Unspecified 12/04/2015  . PPD Test 08/12/2015, 09/02/2015, 09/04/2015  . Tdap 08/10/2014   Pertinent  Health Maintenance Due  Topic Date Due  . INFLUENZA VACCINE  09/16/2017  . DEXA SCAN  Completed  . PNA vac Low Risk Adult  Completed   Fall Risk  10/16/2016  Falls in the past year? No   Functional Status Survey:    Vitals:   09/13/17 1132  BP: 120/60  Pulse: 60  Resp: 20  Temp: 99.1 F (37.3 C)  TempSrc: Oral  SpO2: 96%  Weight: 170 lb 12.8 oz (77.5 kg)  Height: 5\' 6"  (1.676 m)   Body mass index is 27.57 kg/m.   Wt Readings from Last 3 Encounters:  09/13/17 170 lb 12.8 oz (77.5 kg)  08/10/17 171 lb (77.6 kg)  07/20/17 168 lb (76.2 kg)   Physical Exam  Constitutional: No distress.  Overweight elderly female  HENT:  Head: Normocephalic and atraumatic.  Mouth/Throat: Oropharynx is clear and moist.  Eyes: Pupils are equal, round, and reactive to light. Conjunctivae and EOM are normal. Right eye exhibits no discharge. Left eye exhibits no discharge.  Has corrective glasses  Neck: Normal range of motion. Neck supple.  Cardiovascular: Normal rate and regular rhythm.  Pulmonary/Chest: Effort normal and breath sounds normal. She has no wheezes. She has no rales.  Abdominal: Soft. Bowel sounds are normal. There is no tenderness.  Musculoskeletal: She exhibits no edema.  Can move all 4 extremities, limited ROM with her knees left > right, needs  walker for ambulation  Lymphadenopathy:    She has no cervical adenopathy.  Neurological: She is alert.  Oriented to self  Skin: Skin is warm and dry. She is not diaphoretic.    Labs reviewed: Recent Labs    11/17/16 12/22/16 08/12/17 08/24/17  NA 141 140  --   --   K 4.0 4.3  --   --   BUN 13 16  --   --   CREATININE 0.8 0.8  --   --   CALCIUM  --   --  10.5 10.5   Recent Labs    11/17/16 02/05/17  AST 17 55*  ALT 15 79*  ALKPHOS 56 50   Recent Labs    03/23/17 08/12/17  WBC 5.4 4.7  HGB 13.3 13.9  HCT 38 41  PLT 157 152   Lab Results  Component Value Date   TSH 1.71 08/12/2017   No results found for: HGBA1C Lab Results  Component Value Date   CHOL 203 (A) 11/17/2016   HDL 39 11/17/2016   LDLCALC 126 11/17/2016   TRIG 236 (A) 11/17/2016    Significant Diagnostic Results in last 30 days:  No results found.  Assessment/Plan  1. Primary osteoarthritis of left knee Continue tylenol 500 mg q6h prn pain, walker for ambualtion, fall precautions  2. Hypothyroidism, unspecified type Lab Results  Component Value Date   TSH 1.71 08/12/2017   Continue levothyroxine 75 mcg daily, stable TSH  3. Paroxysmal A-fib (HCC) Controlled HR, not on any rate controlling agent. Continue apixaban for stroke prevention  4. Chronic constipation Continue daily miralax and maintain hydration    Family/ staff Communication: reviewed care plan with patient and charge nurse.    Labs/tests ordered:  none   Blanchie Serve, MD Internal Medicine [facility] [address] [place], [place] [address] Cell Phone (Monday-Friday 8 am - 5 pm): [phone] On Call: [phone] and follow prompts after 5 pm and on weekends Office Phone: [phone] Office Fax: [phone]

## 2017-09-29 ENCOUNTER — Encounter: Payer: Self-pay | Admitting: Internal Medicine

## 2017-10-13 ENCOUNTER — Encounter: Payer: Self-pay | Admitting: Nurse Practitioner

## 2017-10-13 ENCOUNTER — Non-Acute Institutional Stay (SKILLED_NURSING_FACILITY): Payer: Medicare Other | Admitting: Nurse Practitioner

## 2017-10-13 DIAGNOSIS — G301 Alzheimer's disease with late onset: Secondary | ICD-10-CM | POA: Diagnosis not present

## 2017-10-13 DIAGNOSIS — I480 Paroxysmal atrial fibrillation: Secondary | ICD-10-CM | POA: Diagnosis not present

## 2017-10-13 DIAGNOSIS — K5909 Other constipation: Secondary | ICD-10-CM

## 2017-10-13 DIAGNOSIS — F0280 Dementia in other diseases classified elsewhere without behavioral disturbance: Secondary | ICD-10-CM

## 2017-10-13 DIAGNOSIS — H1131 Conjunctival hemorrhage, right eye: Secondary | ICD-10-CM

## 2017-10-13 NOTE — Assessment & Plan Note (Signed)
No pain or change of vision, it should heal. Update CBC in setting of chronic anticoagulation with Apixaban.

## 2017-10-13 NOTE — Assessment & Plan Note (Signed)
Heart rate is in control, continue Apixaban 5mg bid for thromboembolic risk reduction.  

## 2017-10-13 NOTE — Assessment & Plan Note (Signed)
Resides in SNF [facility] for care and safety needs. Continue Namzaric 21/10mg  po qd.

## 2017-10-13 NOTE — Assessment & Plan Note (Signed)
Serum Ca remains 10s, the patient is asymptomatic, continue to observe

## 2017-10-13 NOTE — Progress Notes (Signed)
Location:  [facility] Room Number: 110 Place of Service:  SNF ([phone]) Provider:  Mast, Lennie Odor  NP  Blanchie Serve, MD  Patient Care Team: Blanchie Serve, MD as PCP - General (Internal Medicine)  Extended Emergency Contact Information Primary Emergency Contact: [name],[name] Address: [address] [place] [address] [name] of [place] Phone: [phone] Mobile Phone: [phone] Relation: Other  Code Status:  DNR Goals of care: Advanced Directive information Advanced Directives 10/13/2017  Does Patient Have a Medical Advance Directive? Yes  Type of Advance Directive Out of facility DNR (pink MOST or yellow form)  Does patient want to make changes to medical advance directive? No - Patient declined  Copy of [place] in Chart? -  Would patient like information on creating a medical advance directive? -  Pre-existing out of facility DNR order (yellow form or pink MOST form) Yellow form placed in chart (order not valid for inpatient use)     Chief Complaint  Patient presents with  . Medical Management of Chronic Issues    F/u- Alzheimers, Hypothyroidism, constipation, C/o of redness to eye    HPI:  Pt is a 84 y.o. female seen today for medical management of chronic diseases.     The patient has Hx of constipation, stable on MiraLax daily. Hx of Afib, heart rate is in control, on Apixaban 5mg  bid for thromboembolic risk reduction. She resides in memory care unit [facility], self transfer and ambulating, on Namzaric 21/10 daily for memory. Hx of Hypercalcemia, serum Ca stays 10s.    The patient was noted to have the right conjunctival bleed, she denied pain, itching, change of vision, no drainage seen, uncertain of onset, duration is about a day.  Past Medical History:  Diagnosis Date  . Arthritis   . First degree AV block   . Hypothyroidism   . Hypothyroidism    left thyroid  . Lichen sclerosus   . OA  (osteoarthritis)    hands  . Osteoporosis   . Paroxysmal atrial fibrillation (HCC)   . Sick sinus syndrome ([place])   . Vitamin D deficiency    Past Surgical History:  Procedure Laterality Date  . ABDOMINAL HYSTERECTOMY  1974  . APPENDECTOMY  1951  . EP IMPLANTABLE DEVICE N/A 08/13/2014   Procedure: Pacemaker Implant;  Surgeon: Deboraha Sprang, MD;  Location: [facility];  Service: Cardiovascular;  Laterality: N/A;  . HAND SURGERY  20years ago   open dislocation  . TONSILLECTOMY  1942   x2  . TOTAL KNEE ARTHROPLASTY  01/25/2012   Procedure: TOTAL KNEE ARTHROPLASTY;  Surgeon: Gearlean Alf, MD;  Location: [facility];  Service: Orthopedics;  Laterality: Right;    Allergies  Allergen Reactions  . Augmentin [Amoxicillin-Pot Clavulanate] Nausea And Vomiting  . Quinine Derivatives Other (See Comments)    Pt states "im just allergic" unknown reaction  . Penicillins Rash    Outpatient Encounter Medications as of 10/13/2017  Medication Sig  . acetaminophen (TYLENOL) 500 MG tablet Take 500 mg by mouth every 6 (six) hours as needed (PAIN).   Marland Kitchen apixaban (ELIQUIS) 5 MG TABS tablet Take 1 tablet (5 mg total) by mouth 2 (two) times daily.  Marland Kitchen Apoaequorin (PREVAGEN) 10 MG CAPS Take 1 capsule by mouth daily.  . Homeopathic Products (MUSCLE THERAPY/ARNICA) GEL Apply 1 application topically at bedtime.  Marland Kitchen levothyroxine (SYNTHROID, LEVOTHROID) 75 MCG tablet Take 75 mcg  by mouth daily before breakfast.  . Lutein-Zeaxanthin 20-1 MG CAPS Take 1 tablet by mouth daily.  . Magnesium 500 MG CAPS Take 1 capsule by mouth daily.  . Memantine HCl-Donepezil HCl (NAMZARIC) 21-10 MG CP24 Take 1 tablet by mouth daily.  Vladimir Faster Glycol-Propyl Glycol (SYSTANE) 0.4-0.3 % SOLN Apply 1 drop to eye 2 (two) times daily. For both eyes.  . polyethylene glycol (MIRALAX / GLYCOLAX) packet Take 17 g by mouth daily.  Marland Kitchen triamcinolone cream (KENALOG) 0.1 % Apply 1 application topically 2 (two) times daily as needed. Apply to  hands and legs  . [DISCONTINUED] feeding supplement (BOOST / RESOURCE BREEZE) LIQD Take 1 Container by mouth daily.    No facility-administered encounter medications on file as of 10/13/2017.    ROS was provided with assistance of staff Review of Systems  Constitutional: Negative for activity change, appetite change, chills, diaphoresis, fatigue and fever.  HENT: Positive for hearing loss. Negative for congestion and voice change.   Eyes: Positive for redness. Negative for pain, discharge, itching and visual disturbance.       Right eye  Respiratory: Negative for cough and shortness of breath.   Cardiovascular: Negative for chest pain, palpitations and leg swelling.  Gastrointestinal: Negative for abdominal distention, abdominal pain, constipation, diarrhea, nausea and vomiting.  Genitourinary: Negative for difficulty urinating, dysuria and urgency.  Musculoskeletal: Positive for arthralgias and gait problem.       Mostly complained left knee pain  Skin: Negative for color change and pallor.  Neurological: Negative for dizziness, speech difficulty, weakness and headaches.       Dementia    Immunization History  Administered Date(s) Administered  . Influenza-Unspecified 12/04/2015  . PPD Test 08/12/2015, 09/02/2015, 09/04/2015  . Pneumococcal Conjugate-13 10/13/2010  . Tdap 08/10/2014   Pertinent  Health Maintenance Due  Topic Date Due  . INFLUENZA VACCINE  09/16/2017  . DEXA SCAN  Completed  . PNA vac Low Risk Adult  Completed   Fall Risk  10/16/2016  Falls in the past year? No   Functional Status Survey:    Vitals:   10/13/17 1331  BP: 120/72  Pulse: 70  Resp: 18  Temp: (!) 97.5 F (36.4 C)  SpO2: 96%  Weight: 171 lb 9.6 oz (77.8 kg)  Height: 5\' 6"  (1.676 m)   Body mass index is 27.7 kg/m. Physical Exam  Constitutional: She appears well-developed.  HENT:  Head: Normocephalic and atraumatic.  Eyes: Pupils are equal, round, and reactive to light. EOM are normal.   Subconjunctival hemorrhage right eye  Neck: Normal range of motion. Neck supple. No JVD present. No thyromegaly present.  Cardiovascular: Normal rate.  No murmur heard. Irregular heart beats  Pulmonary/Chest: She has no wheezes. She has no rales.  Abdominal: Soft. Bowel sounds are normal. She exhibits no distension. There is no tenderness.  Musculoskeletal: She exhibits no edema.  Chronic left knee pain, ambulates with walker.   Neurological: She is alert. No cranial nerve deficit. She exhibits normal muscle tone. Coordination normal.  Oriented to person and her room on unit.   Skin: Skin is warm and dry.  Psychiatric: She has a normal mood and affect. Her behavior is normal.    Labs reviewed: Recent Labs    11/17/16 12/22/16 08/12/17 08/24/17  NA 141 140  --   --   K 4.0 4.3  --   --   BUN 13 16  --   --   CREATININE 0.8 0.8  --   --  CALCIUM  --   --  10.5 10.5   Recent Labs    11/17/16 02/05/17  AST 17 55*  ALT 15 79*  ALKPHOS 56 50   Recent Labs    03/23/17 08/12/17  WBC 5.4 4.7  HGB 13.3 13.9  HCT 38 41  PLT 157 152   Lab Results  Component Value Date   TSH 1.71 08/12/2017   No results found for: HGBA1C Lab Results  Component Value Date   CHOL 203 (A) 11/17/2016   HDL 39 11/17/2016   LDLCALC 126 11/17/2016   TRIG 236 (A) 11/17/2016    Significant Diagnostic Results in last 30 days:  No results found.  Assessment/Plan Paroxysmal A-fib (HCC) Heart rate is in control, continue Apixaban 5mg  bid for thromboembolic risk reduction.   Chronic constipation Stable, continue MiraLax daily.   Late onset Alzheimer's disease without behavioral disturbance Resides in SNF [facility] for care and safety needs. Continue Namzaric 21/10mg  po qd.   Hypercalcemia Serum Ca remains 10s, the patient is asymptomatic, continue to observe   Subconjunctival hematoma, right No pain or change of vision, it should heal. Update CBC in setting of chronic anticoagulation with  Apixaban.      Family/ staff Communication: plan of care reviewed with the patient and charge nurse.   Labs/tests ordered: CBC  Time spend 25 minutes.

## 2017-10-13 NOTE — Assessment & Plan Note (Signed)
Stable, continue MiraLax daily.  

## 2017-10-14 LAB — CBC AND DIFFERENTIAL
HEMATOCRIT: 39 (ref 36–46)
Hemoglobin: 13.1 (ref 12.0–16.0)
PLATELETS: 153 (ref 150–399)
WBC: 5.2

## 2017-10-15 ENCOUNTER — Other Ambulatory Visit: Payer: Self-pay | Admitting: *Deleted

## 2017-10-26 ENCOUNTER — Non-Acute Institutional Stay (SKILLED_NURSING_FACILITY): Payer: Medicare Other

## 2017-10-26 DIAGNOSIS — Z0000 Encounter for general adult medical examination without abnormal findings: Secondary | ICD-10-CM

## 2017-10-26 NOTE — Progress Notes (Signed)
Subjective:   Kristine Holden is a 84 y.o. female who presents for Medicare Annual (Subsequent) preventive examination at [facility]  Last AWV-10/16/2016  Objective:     Vitals: BP 125/70 (BP Location: Left Arm, Patient Position: Sitting)   Pulse 65   Temp 98.1 F (36.7 C) (Oral)   Ht 5\' 6"  (1.676 m)   Wt 171 lb (77.6 kg)   LMP  (LMP Unknown)   BMI 27.60 kg/m   Body mass index is 27.6 kg/m.  Advanced Directives 10/26/2017 10/13/2017 09/13/2017 07/20/2017 07/15/2017 06/07/2017 05/14/2017  Does Patient Have a Medical Advance Directive? Yes Yes Yes Yes Yes Yes Yes  Type of Advance Directive Out of facility DNR (pink MOST or yellow form) Out of facility DNR (pink MOST or yellow form) Out of facility DNR (pink MOST or yellow form) Out of facility DNR (pink MOST or yellow form) Out of facility DNR (pink MOST or yellow form) Out of facility DNR (pink MOST or yellow form) Out of facility DNR (pink MOST or yellow form)  Does patient want to make changes to medical advance directive? No - Patient declined No - Patient declined No - Patient declined No - Patient declined No - Patient declined No - Patient declined No - Patient declined  Copy of [place] in [place]  Would patient like information on creating a medical advance directive? - - - - - - -  Pre-existing out of facility DNR order (yellow form or pink MOST form) Yellow form placed in chart (order not valid for inpatient use) Yellow form placed in chart (order not valid for inpatient use) Yellow form placed in chart (order not valid for inpatient use) Yellow form placed in chart (order not valid for inpatient use) Yellow form placed in chart (order not valid for inpatient use) Yellow form placed in chart (order not valid for inpatient use) Yellow form placed in chart (order not valid for inpatient use)    Tobacco Social History   Tobacco Use  Smoking Status Never Smoker  Smokeless Tobacco  Never Used     Counseling given: Not Answered   Clinical Intake:  Pre-visit preparation completed: No  Pain : No/denies pain     Nutritional Risks: None Diabetes: No  How often do you need to have someone help you when you read instructions, pamphlets, or other written materials from your doctor or pharmacy?: 3 - Sometimes  Interpreter Needed?: No  Information entered by :: Tyson Dense, RN  Past Medical History:  Diagnosis Date  . Arthritis   . First degree AV block   . Hypothyroidism   . Hypothyroidism    left thyroid  . Lichen sclerosus   . OA (osteoarthritis)    hands  . Osteoporosis   . Paroxysmal atrial fibrillation (HCC)   . Sick sinus syndrome ([place])   . Vitamin D deficiency    Past Surgical History:  Procedure Laterality Date  . ABDOMINAL HYSTERECTOMY  1974  . APPENDECTOMY  1951  . EP IMPLANTABLE DEVICE N/A 08/13/2014   Procedure: Pacemaker Implant;  Surgeon: Deboraha Sprang, MD;  Location: [facility];  Service: Cardiovascular;  Laterality: N/A;  . HAND SURGERY  20years ago   open dislocation  . TONSILLECTOMY  1942   x2  . TOTAL KNEE ARTHROPLASTY  01/25/2012   Procedure: TOTAL KNEE ARTHROPLASTY;  Surgeon: Gearlean Alf, MD;  Location: [facility];  Service: Orthopedics;  Laterality: Right;   Family History  Problem Relation Age of Onset  . Heart attack Father   . Heart disease Father    Social History   Socioeconomic History  . Marital status: Single    Spouse name: Not on file  . Number of children: Not on file  . Years of education: Not on file  . Highest education level: Not on file  Occupational History  . Not on file  Social Needs  . Financial resource strain: Not on file  . Food insecurity:    Worry: Not on file    Inability: Not on file  . Transportation needs:    Medical: Not on file    Non-medical: Not on file  Tobacco Use  . Smoking status: Never Smoker  . Smokeless tobacco: Never Used  Substance and Sexual Activity  .  Alcohol use: No  . Drug use: No  . Sexual activity: Not on file  Lifestyle  . Physical activity:    Days per week: 2 days    Minutes per session: 30 min  . Stress: Only a little  Relationships  . Social connections:    Talks on phone: Not on file    Gets together: Not on file    Attends religious service: Never    Active member of club or organization: No    Attends meetings of clubs or organizations: Never    Relationship status: Not on file  Other Topics Concern  . Not on file  Social History Narrative  . Not on file    Outpatient Encounter Medications as of 10/26/2017  Medication Sig  . acetaminophen (TYLENOL) 500 MG tablet Take 500 mg by mouth every 6 (six) hours as needed (PAIN).   Marland Kitchen apixaban (ELIQUIS) 5 MG TABS tablet Take 1 tablet (5 mg total) by mouth 2 (two) times daily.  Marland Kitchen Apoaequorin (PREVAGEN) 10 MG CAPS Take 1 capsule by mouth daily.  . Homeopathic Products (MUSCLE THERAPY/ARNICA) GEL Apply 1 application topically at bedtime.  Marland Kitchen levothyroxine (SYNTHROID, LEVOTHROID) 75 MCG tablet Take 75 mcg by mouth daily before breakfast.  . Lutein-Zeaxanthin 20-1 MG CAPS Take 1 tablet by mouth daily.  . Magnesium 500 MG CAPS Take 1 capsule by mouth daily.  . Memantine HCl-Donepezil HCl (NAMZARIC) 21-10 MG CP24 Take 1 tablet by mouth daily.  Vladimir Faster Glycol-Propyl Glycol (SYSTANE) 0.4-0.3 % SOLN Apply 1 drop to eye 2 (two) times daily. For both eyes.  . polyethylene glycol (MIRALAX / GLYCOLAX) packet Take 17 g by mouth daily.  Marland Kitchen triamcinolone cream (KENALOG) 0.1 % Apply 1 application topically 2 (two) times daily as needed. Apply to hands and legs   No facility-administered encounter medications on file as of 10/26/2017.     Activities of Daily Living In your present state of health, do you have any difficulty performing the following activities: 10/26/2017  Hearing? N  Vision? N  Difficulty concentrating or making decisions? Y  Walking or climbing stairs? Y  Dressing or  bathing? Y  Doing errands, shopping? Y  Preparing Food and eating ? Y  Using the Toilet? Y  In the past six months, have you accidently leaked urine? N  Do you have problems with loss of bowel control? N  Managing your Medications? Y  Managing your Finances? Y  Housekeeping or managing your Housekeeping? Y  Some recent data might be hidden    Patient Care Team: Blanchie Serve, MD as PCP - General (Internal Medicine)    Assessment:  This is a routine wellness examination for [facility].  Exercise Activities and Dietary recommendations Current Exercise Habits: Structured exercise class, Type of exercise: stretching, Time (Minutes): 30, Frequency (Times/Week): 2, Weekly Exercise (Minutes/Week): 60, Intensity: Mild, Exercise limited by: None identified  Goals   None     Fall Risk Fall Risk  10/26/2017 10/16/2016  Falls in the past year? No No   Is the patient's home free of loose throw rugs in walkways, pet beds, electrical cords, etc?   yes      Grab bars in the bathroom? yes      Handrails on the stairs?   yes      Adequate lighting?   yes  Depression Screen PHQ 2/9 Scores 10/26/2017 10/16/2016  PHQ - 2 Score 0 0     Cognitive Function MMSE - Mini Mental State Exam 10/26/2017 10/16/2016  Orientation to time 1 0  Orientation to Place 0 2  Registration 3 3  Attention/ Calculation 0 0  Recall 0 0  Language- name 2 objects 2 2  Language- repeat 1 1  Language- follow 3 step command 3 3  Language- read & follow direction 1 1  Write a sentence 0 1  Copy design 0 1  Total score 11 14        Immunization History  Administered Date(s) Administered  . Influenza-Unspecified 12/04/2015  . PPD Test 08/12/2015, 09/02/2015, 09/04/2015  . Pneumococcal Conjugate-13 10/13/2010  . Tdap 08/10/2014    Qualifies for Shingles Vaccine? Not in past records  Screening Tests Health Maintenance  Topic Date Due  . INFLUENZA VACCINE  09/16/2017  . TETANUS/TDAP  08/09/2024  . DEXA SCAN   Completed  . PNA vac Low Risk Adult  Completed    Cancer Screenings: Lung: Low Dose CT Chest recommended if Age 65-80 years, 30 pack-year currently smoking OR have quit w/in 15years. Patient does not qualify. Breast:  Up to date on Mammogram? Yes   Up to date of Bone Density/Dexa? Yes Colorectal: up to date  Additional Screenings:  Hepatitis C Screening: unable to appropriately accept or decline Flu vaccine due: will receive at [facility]:  I have personally reviewed and addressed the Medicare Annual Wellness questionnaire and have noted the following in the patient's chart:  A. Medical and social history B. Use of alcohol, tobacco or illicit drugs  C. Current medications and supplements D. Functional ability and status E.  Nutritional status F.  Physical activity G. Advance directives H. List of other physicians I.  Hospitalizations, surgeries, and ER visits in previous 12 months J.  [place] to include hearing, vision, cognitive, depression L. Referrals and appointments - none  In addition, I have reviewed and discussed with patient certain preventive protocols, quality metrics, and best practice recommendations. A written personalized care plan for preventive services as well as general preventive health recommendations were provided to patient.  See attached scanned questionnaire for additional information.   Signed,   Tyson Dense, RN Nurse Health Advisor  Patient Concerns: None

## 2017-10-26 NOTE — Patient Instructions (Signed)
Ms. Kristine Holden , Thank you for taking time to come for your Medicare Wellness Visit. I appreciate your ongoing commitment to your health goals. Please review the following plan we discussed and let me know if I can assist you in the future.   Screening recommendations/referrals: Colonoscopy excluded, over age 82 Mammogram excluded, over age 53 Bone Density up to date Recommended yearly ophthalmology/optometry visit for glaucoma screening and checkup Recommended yearly dental visit for hygiene and checkup  Vaccinations: Influenza vaccine due, will receive at [facility] Pneumococcal vaccine up to date, completed Tdap vaccine up to date Shingles vaccine not in past records    Advanced directives: in chart  Conditions/risks identified: none  Next appointment: Dr. Bubba Camp makes rounds   Preventive Care 9 Years and Older, Female Preventive care refers to lifestyle choices and visits with your health care provider that can promote health and wellness. What does preventive care include?  A yearly physical exam. This is also called an annual well check.  Dental exams once or twice a year.  Routine eye exams. Ask your health care provider how often you should have your eyes checked.  Personal lifestyle choices, including:  Daily care of your teeth and gums.  Regular physical activity.  Eating a healthy diet.  Avoiding tobacco and drug use.  Limiting alcohol use.  Practicing safe sex.  Taking low-dose aspirin every day.  Taking vitamin and mineral supplements as recommended by your health care provider. What happens during an annual well check? The services and screenings done by your health care provider during your annual well check will depend on your age, overall health, lifestyle risk factors, and family history of disease. Counseling  Your health care provider may ask you questions about your:  Alcohol use.  Tobacco use.  Drug use.  Emotional well-being.  Home and  relationship well-being.  Sexual activity.  Eating habits.  History of falls.  Memory and ability to understand (cognition).  Work and work Statistician.  Reproductive health. Screening  You may have the following tests or measurements:  Height, weight, and BMI.  Blood pressure.  Lipid and cholesterol levels. These may be checked every 5 years, or more frequently if you are over 44 years old.  Skin check.  Lung cancer screening. You may have this screening every year starting at age 69 if you have a 30-pack-year history of smoking and currently smoke or have quit within the past 15 years.  Fecal occult blood test (FOBT) of the stool. You may have this test every year starting at age 63.  Flexible sigmoidoscopy or colonoscopy. You may have a sigmoidoscopy every 5 years or a colonoscopy every 10 years starting at age 45.  Hepatitis C blood test.  Hepatitis B blood test.  Sexually transmitted disease (STD) testing.  Diabetes screening. This is done by checking your blood sugar (glucose) after you have not eaten for a while (fasting). You may have this done every 1-3 years.  Bone density scan. This is done to screen for osteoporosis. You may have this done starting at age 30.  Mammogram. This may be done every 1-2 years. Talk to your health care provider about how often you should have regular mammograms. Talk with your health care provider about your test results, treatment options, and if necessary, the need for more tests. Vaccines  Your health care provider may recommend certain vaccines, such as:  Influenza vaccine. This is recommended every year.  Tetanus, diphtheria, and acellular pertussis (Tdap, Td) vaccine. You may  need a Td booster every 10 years.  Zoster vaccine. You may need this after age 93.  Pneumococcal 13-valent conjugate (PCV13) vaccine. One dose is recommended after age 18.  Pneumococcal polysaccharide (PPSV23) vaccine. One dose is recommended after  age 59. Talk to your health care provider about which screenings and vaccines you need and how often you need them. This information is not intended to replace advice given to you by your health care provider. Make sure you discuss any questions you have with your health care provider. Document Released: 03/01/2015 Document Revised: 10/23/2015 Document Reviewed: 12/04/2014 Elsevier Interactive Patient Education  2017 [place] Prevention in the Home Falls can cause injuries. They can happen to people of all ages. There are many things you can do to make your home safe and to help prevent falls. What can I do on the outside of my home?  Regularly fix the edges of walkways and driveways and fix any cracks.  Remove anything that might make you trip as you walk through a door, such as a raised step or threshold.  Trim any bushes or trees on the path to your home.  Use bright outdoor lighting.  Clear any walking paths of anything that might make someone trip, such as rocks or tools.  Regularly check to see if handrails are loose or broken. Make sure that both sides of any steps have handrails.  Any raised decks and porches should have guardrails on the edges.  Have any leaves, snow, or ice cleared regularly.  Use sand or salt on walking paths during winter.  Clean up any spills in your garage right away. This includes oil or grease spills. What can I do in the bathroom?  Use night lights.  Install grab bars by the toilet and in the tub and shower. Do not use towel bars as grab bars.  Use non-skid mats or decals in the tub or shower.  If you need to sit down in the shower, use a plastic, non-slip stool.  Keep the floor dry. Clean up any water that spills on the floor as soon as it happens.  Remove soap buildup in the tub or shower regularly.  Attach bath mats securely with double-sided non-slip rug tape.  Do not have throw rugs and other things on the floor that can  make you trip. What can I do in the bedroom?  Use night lights.  Make sure that you have a light by your bed that is easy to reach.  Do not use any sheets or blankets that are too big for your bed. They should not hang down onto the floor.  Have a firm chair that has side arms. You can use this for support while you get dressed.  Do not have throw rugs and other things on the floor that can make you trip. What can I do in the kitchen?  Clean up any spills right away.  Avoid walking on wet floors.  Keep items that you use a lot in easy-to-reach places.  If you need to reach something above you, use a strong step stool that has a grab bar.  Keep electrical cords out of the way.  Do not use floor polish or wax that makes floors slippery. If you must use wax, use non-skid floor wax.  Do not have throw rugs and other things on the floor that can make you trip. What can I do with my stairs?  Do not leave any items on  the stairs.  Make sure that there are handrails on both sides of the stairs and use them. Fix handrails that are broken or loose. Make sure that handrails are as long as the stairways.  Check any carpeting to make sure that it is firmly attached to the stairs. Fix any carpet that is loose or worn.  Avoid having throw rugs at the top or bottom of the stairs. If you do have throw rugs, attach them to the floor with carpet tape.  Make sure that you have a light switch at the top of the stairs and the bottom of the stairs. If you do not have them, ask someone to add them for you. What else can I do to help prevent falls?  Wear shoes that:  Do not have high heels.  Have rubber bottoms.  Are comfortable and fit you well.  Are closed at the toe. Do not wear sandals.  If you use a stepladder:  Make sure that it is fully opened. Do not climb a closed stepladder.  Make sure that both sides of the stepladder are locked into place.  Ask someone to hold it for you,  if possible.  Clearly mark and make sure that you can see:  Any grab bars or handrails.  First and last steps.  Where the edge of each step is.  Use tools that help you move around (mobility aids) if they are needed. These include:  Canes.  Walkers.  Scooters.  Crutches.  Turn on the lights when you go into a dark area. Replace any light bulbs as soon as they burn out.  Set up your furniture so you have a clear path. Avoid moving your furniture around.  If any of your floors are uneven, fix them.  If there are any pets around you, be aware of where they are.  Review your medicines with your doctor. Some medicines can make you feel dizzy. This can increase your chance of falling. Ask your doctor what other things that you can do to help prevent falls. This information is not intended to replace advice given to you by your health care provider. Make sure you discuss any questions you have with your health care provider. Document Released: 11/29/2008 Document Revised: 07/11/2015 Document Reviewed: 03/09/2014 Elsevier Interactive Patient Education  2017 [employer].

## 2017-11-12 ENCOUNTER — Encounter (HOSPITAL_COMMUNITY): Payer: Self-pay | Admitting: Emergency Medicine

## 2017-11-12 ENCOUNTER — Emergency Department (HOSPITAL_COMMUNITY): Payer: Medicare Other

## 2017-11-12 ENCOUNTER — Encounter: Payer: Self-pay | Admitting: Nurse Practitioner

## 2017-11-12 ENCOUNTER — Non-Acute Institutional Stay (SKILLED_NURSING_FACILITY): Payer: Medicare Other | Admitting: Nurse Practitioner

## 2017-11-12 ENCOUNTER — Inpatient Hospital Stay (HOSPITAL_COMMUNITY)
Admission: EM | Admit: 2017-11-12 | Discharge: 2017-11-19 | DRG: 070 | Disposition: A | Discharge status: Skilled Nursing Facility | Payer: Medicare Other | Attending: Internal Medicine | Admitting: Internal Medicine

## 2017-11-12 DIAGNOSIS — G969 Disorder of central nervous system, unspecified: Secondary | ICD-10-CM

## 2017-11-12 DIAGNOSIS — I440 Atrioventricular block, first degree: Secondary | ICD-10-CM | POA: Diagnosis present

## 2017-11-12 DIAGNOSIS — M810 Age-related osteoporosis without current pathological fracture: Secondary | ICD-10-CM | POA: Diagnosis present

## 2017-11-12 DIAGNOSIS — R001 Bradycardia, unspecified: Secondary | ICD-10-CM | POA: Diagnosis present

## 2017-11-12 DIAGNOSIS — M1710 Unilateral primary osteoarthritis, unspecified knee: Secondary | ICD-10-CM | POA: Diagnosis present

## 2017-11-12 DIAGNOSIS — Z881 Allergy status to other antibiotic agents status: Secondary | ICD-10-CM

## 2017-11-12 DIAGNOSIS — I480 Paroxysmal atrial fibrillation: Secondary | ICD-10-CM

## 2017-11-12 DIAGNOSIS — R569 Unspecified convulsions: Secondary | ICD-10-CM | POA: Diagnosis not present

## 2017-11-12 DIAGNOSIS — G301 Alzheimer's disease with late onset: Secondary | ICD-10-CM

## 2017-11-12 DIAGNOSIS — R4182 Altered mental status, unspecified: Secondary | ICD-10-CM | POA: Diagnosis not present

## 2017-11-12 DIAGNOSIS — G40209 Localization-related (focal) (partial) symptomatic epilepsy and epileptic syndromes with complex partial seizures, not intractable, without status epilepticus: Secondary | ICD-10-CM | POA: Diagnosis present

## 2017-11-12 DIAGNOSIS — E039 Hypothyroidism, unspecified: Secondary | ICD-10-CM | POA: Diagnosis present

## 2017-11-12 DIAGNOSIS — Z96651 Presence of right artificial knee joint: Secondary | ICD-10-CM | POA: Diagnosis present

## 2017-11-12 DIAGNOSIS — R011 Cardiac murmur, unspecified: Secondary | ICD-10-CM | POA: Diagnosis not present

## 2017-11-12 DIAGNOSIS — Z950 Presence of cardiac pacemaker: Secondary | ICD-10-CM | POA: Diagnosis not present

## 2017-11-12 DIAGNOSIS — I495 Sick sinus syndrome: Secondary | ICD-10-CM | POA: Diagnosis present

## 2017-11-12 DIAGNOSIS — Z66 Do not resuscitate: Secondary | ICD-10-CM | POA: Diagnosis present

## 2017-11-12 DIAGNOSIS — E559 Vitamin D deficiency, unspecified: Secondary | ICD-10-CM | POA: Diagnosis present

## 2017-11-12 DIAGNOSIS — E785 Hyperlipidemia, unspecified: Secondary | ICD-10-CM | POA: Diagnosis present

## 2017-11-12 DIAGNOSIS — Z90710 Acquired absence of both cervix and uterus: Secondary | ICD-10-CM

## 2017-11-12 DIAGNOSIS — R1312 Dysphagia, oropharyngeal phase: Secondary | ICD-10-CM | POA: Diagnosis present

## 2017-11-12 DIAGNOSIS — F0280 Dementia in other diseases classified elsewhere without behavioral disturbance: Secondary | ICD-10-CM | POA: Diagnosis present

## 2017-11-12 DIAGNOSIS — I5030 Unspecified diastolic (congestive) heart failure: Secondary | ICD-10-CM | POA: Diagnosis not present

## 2017-11-12 DIAGNOSIS — M1990 Unspecified osteoarthritis, unspecified site: Secondary | ICD-10-CM | POA: Diagnosis present

## 2017-11-12 DIAGNOSIS — F0390 Unspecified dementia without behavioral disturbance: Secondary | ICD-10-CM | POA: Diagnosis not present

## 2017-11-12 DIAGNOSIS — I609 Nontraumatic subarachnoid hemorrhage, unspecified: Secondary | ICD-10-CM

## 2017-11-12 DIAGNOSIS — Z8249 Family history of ischemic heart disease and other diseases of the circulatory system: Secondary | ICD-10-CM | POA: Diagnosis not present

## 2017-11-12 DIAGNOSIS — R29810 Facial weakness: Secondary | ICD-10-CM | POA: Diagnosis present

## 2017-11-12 DIAGNOSIS — G40211 Localization-related (focal) (partial) symptomatic epilepsy and epileptic syndromes with complex partial seizures, intractable, with status epilepticus: Secondary | ICD-10-CM

## 2017-11-12 DIAGNOSIS — R531 Weakness: Secondary | ICD-10-CM | POA: Diagnosis present

## 2017-11-12 DIAGNOSIS — Z7901 Long term (current) use of anticoagulants: Secondary | ICD-10-CM | POA: Diagnosis not present

## 2017-11-12 DIAGNOSIS — G939 Disorder of brain, unspecified: Secondary | ICD-10-CM | POA: Diagnosis not present

## 2017-11-12 DIAGNOSIS — M179 Osteoarthritis of knee, unspecified: Secondary | ICD-10-CM | POA: Diagnosis present

## 2017-11-12 DIAGNOSIS — G40901 Epilepsy, unspecified, not intractable, with status epilepticus: Secondary | ICD-10-CM | POA: Diagnosis not present

## 2017-11-12 DIAGNOSIS — Z880 Allergy status to penicillin: Secondary | ICD-10-CM | POA: Diagnosis not present

## 2017-11-12 DIAGNOSIS — R4701 Aphasia: Secondary | ICD-10-CM | POA: Diagnosis not present

## 2017-11-12 DIAGNOSIS — G8191 Hemiplegia, unspecified affecting right dominant side: Secondary | ICD-10-CM | POA: Diagnosis present

## 2017-11-12 DIAGNOSIS — G936 Cerebral edema: Secondary | ICD-10-CM | POA: Diagnosis present

## 2017-11-12 DIAGNOSIS — N390 Urinary tract infection, site not specified: Secondary | ICD-10-CM | POA: Diagnosis present

## 2017-11-12 DIAGNOSIS — G9389 Other specified disorders of brain: Principal | ICD-10-CM | POA: Diagnosis present

## 2017-11-12 DIAGNOSIS — Z781 Physical restraint status: Secondary | ICD-10-CM

## 2017-11-12 DIAGNOSIS — Z888 Allergy status to other drugs, medicaments and biological substances status: Secondary | ICD-10-CM

## 2017-11-12 DIAGNOSIS — Z79890 Hormone replacement therapy (postmenopausal): Secondary | ICD-10-CM

## 2017-11-12 DIAGNOSIS — R4781 Slurred speech: Secondary | ICD-10-CM | POA: Diagnosis not present

## 2017-11-12 LAB — COMPREHENSIVE METABOLIC PANEL
ALBUMIN: 3.4 g/dL — AB (ref 3.5–5.0)
ALT: 26 U/L (ref 0–44)
AST: 24 U/L (ref 15–41)
Alkaline Phosphatase: 68 U/L (ref 38–126)
Anion gap: 7 (ref 5–15)
BILIRUBIN TOTAL: 0.9 mg/dL (ref 0.3–1.2)
BUN: 10 mg/dL (ref 8–23)
CALCIUM: 10.5 mg/dL — AB (ref 8.9–10.3)
CO2: 23 mmol/L (ref 22–32)
Chloride: 109 mmol/L (ref 98–111)
Creatinine, Ser: 0.73 mg/dL (ref 0.44–1.00)
GFR calc Af Amer: >60 mL/min (ref >60)
Glucose, Bld: 108 mg/dL — ABNORMAL HIGH (ref 70–99)
POTASSIUM: 4.2 mmol/L (ref 3.5–5.1)
Sodium: 139 mmol/L (ref 135–145)
TOTAL PROTEIN: 6.6 g/dL (ref 6.5–8.1)

## 2017-11-12 LAB — RAPID URINE DRUG SCREEN, HOSP PERFORMED
Amphetamines: NOT DETECTED
Barbiturates: NOT DETECTED
Benzodiazepines: NOT DETECTED
Cocaine: NOT DETECTED
Opiates: NOT DETECTED
TETRAHYDROCANNABINOL: NOT DETECTED

## 2017-11-12 LAB — DIFFERENTIAL
ABS IMMATURE GRANULOCYTES: 0 10*3/uL (ref 0.0–0.1)
BASOS PCT: 0 %
Basophils Absolute: 0 10*3/uL (ref 0.0–0.1)
EOS ABS: 0.1 10*3/uL (ref 0.0–0.7)
EOS PCT: 1 %
IMMATURE GRANULOCYTES: 0 %
Lymphocytes Relative: 18 %
Lymphs Abs: 1.7 10*3/uL (ref 0.7–4.0)
Monocytes Absolute: 0.7 10*3/uL (ref 0.1–1.0)
Monocytes Relative: 7 %
NEUTROS PCT: 74 %
Neutro Abs: 6.7 10*3/uL (ref 1.7–7.7)

## 2017-11-12 LAB — I-STAT TROPONIN, ED: Troponin i, poc: 0 ng/mL (ref 0.00–0.08)

## 2017-11-12 LAB — I-STAT CHEM 8, ED
BUN: 11 mg/dL (ref 8–23)
Calcium, Ion: 1.34 mmol/L (ref 1.15–1.40)
Chloride: 107 mmol/L (ref 98–111)
Creatinine, Ser: 0.7 mg/dL (ref 0.44–1.00)
Glucose, Bld: 104 mg/dL — ABNORMAL HIGH (ref 70–99)
HCT: 43 % (ref 36.0–46.0)
Hemoglobin: 14.6 g/dL (ref 12.0–15.0)
Potassium: 4.2 mmol/L (ref 3.5–5.1)
Sodium: 139 mmol/L (ref 135–145)
TCO2: 22 mmol/L (ref 22–32)

## 2017-11-12 LAB — CBC
HCT: 42.5 % (ref 36.0–46.0)
Hemoglobin: 14 g/dL (ref 12.0–15.0)
MCH: 31.6 pg (ref 26.0–34.0)
MCHC: 32.9 g/dL (ref 30.0–36.0)
MCV: 95.9 fL (ref 78.0–100.0)
Platelets: 193 10*3/uL (ref 150–400)
RBC: 4.43 MIL/uL (ref 3.87–5.11)
RDW: 14.1 % (ref 11.5–15.5)
WBC: 9.2 10*3/uL (ref 4.0–10.5)

## 2017-11-12 LAB — URINALYSIS, ROUTINE W REFLEX MICROSCOPIC
Bilirubin Urine: NEGATIVE
Glucose, UA: NEGATIVE mg/dL
Hgb urine dipstick: NEGATIVE
Ketones, ur: NEGATIVE mg/dL
Nitrite: NEGATIVE
Protein, ur: NEGATIVE mg/dL
Specific Gravity, Urine: >1.046 — ABNORMAL HIGH (ref 1.005–1.030)
pH: 7 (ref 5.0–8.0)

## 2017-11-12 LAB — MAGNESIUM: MAGNESIUM: 2 mg/dL (ref 1.7–2.4)

## 2017-11-12 LAB — APTT: APTT: 28 s (ref 24–36)

## 2017-11-12 LAB — ETHANOL: Alcohol, Ethyl (B): <10 mg/dL (ref <10)

## 2017-11-12 LAB — TSH: TSH: 2.88 u[IU]/mL (ref 0.350–4.500)

## 2017-11-12 LAB — GLUCOSE, CAPILLARY: Glucose-Capillary: 86 mg/dL (ref 70–99)

## 2017-11-12 LAB — PROTIME-INR
INR: 1.13
PROTHROMBIN TIME: 14.4 s (ref 11.4–15.2)

## 2017-11-12 MED ORDER — DEXTROSE-NACL 5-0.45 % IV SOLN
INTRAVENOUS | Status: DC
  Administered 2017-11-12 – 2017-11-15 (×4): via INTRAVENOUS

## 2017-11-12 MED ORDER — SENNOSIDES-DOCUSATE SODIUM 8.6-50 MG PO TABS
1.0000 | ORAL_TABLET | Freq: Every evening | ORAL | Status: DC | PRN
  Administered 2017-11-18: 1 via ORAL
  Filled 2017-11-12: qty 1

## 2017-11-12 MED ORDER — INSULIN ASPART 100 UNIT/ML ~~LOC~~ SOLN
0.0000 [IU] | Freq: Three times a day (TID) | SUBCUTANEOUS | Status: DC
  Administered 2017-11-14 (×2): 1 [IU] via SUBCUTANEOUS
  Administered 2017-11-15: 2 [IU] via SUBCUTANEOUS
  Administered 2017-11-15: 1 [IU] via SUBCUTANEOUS

## 2017-11-12 MED ORDER — LEVOTHYROXINE SODIUM 100 MCG IV SOLR
50.0000 ug | Freq: Every day | INTRAVENOUS | Status: DC
  Administered 2017-11-13 – 2017-11-14 (×2): 50 ug via INTRAVENOUS
  Filled 2017-11-12 (×2): qty 5

## 2017-11-12 MED ORDER — LEVETIRACETAM IN NACL 1500 MG/100ML IV SOLN
1500.0000 mg | INTRAVENOUS | Status: AC
  Administered 2017-11-12: 1500 mg via INTRAVENOUS
  Filled 2017-11-12: qty 100

## 2017-11-12 MED ORDER — ACETAMINOPHEN 650 MG RE SUPP: 650.0000 mg | Freq: Four times a day (QID) | RECTAL | Status: DC | PRN

## 2017-11-12 MED ORDER — IOHEXOL 300 MG/ML  SOLN
100.0000 mL | Freq: Once | INTRAMUSCULAR | Status: AC | PRN
  Administered 2017-11-12: 100 mL via INTRAVENOUS

## 2017-11-12 MED ORDER — LORAZEPAM 2 MG/ML IJ SOLN
2.0000 mg | Freq: Once | INTRAMUSCULAR | Status: AC
  Administered 2017-11-12: 2 mg via INTRAVENOUS
  Filled 2017-11-12: qty 1

## 2017-11-12 MED ORDER — ACETAMINOPHEN 325 MG PO TABS
650.0000 mg | ORAL_TABLET | Freq: Four times a day (QID) | ORAL | Status: DC | PRN
  Administered 2017-11-18: 650 mg via ORAL
  Filled 2017-11-12: qty 2

## 2017-11-12 MED ORDER — LEVETIRACETAM IN NACL 500 MG/100ML IV SOLN
500.0000 mg | Freq: Two times a day (BID) | INTRAVENOUS | Status: DC
  Administered 2017-11-12 – 2017-11-15 (×6): 500 mg via INTRAVENOUS
  Filled 2017-11-12 (×7): qty 100

## 2017-11-12 NOTE — Progress Notes (Signed)
STAT EEG complete. Pt will be vLTM EEG due findings of spot EEG.

## 2017-11-12 NOTE — Progress Notes (Signed)
New Admission Note:  Arrival Method: From ER via stretcher Mental Orientation: pt alert but not oriented Telemetry: bedside monitor.  IV: R hand/ D5 w/0.45 normal saline @ 39ml Pain:0/10 Safety Measures: Safety Fall Prevention Plan was given, discussed. 2W58: Patient has been orientated to the room, unit and the staff. Family:none at the bedside   Orders have been reviewed and implemented. Will continue to monitor the patient. Call light has been placed within reach and bed alarm has been activated.   Arta Silence ,RN

## 2017-11-12 NOTE — ED Triage Notes (Signed)
Pt here via [facility] from [facility].  Staff noticed right sided weakness yesterday at 1300, has not been able to walk since then and normally can without assistance, speech is not as clear as normal.  Pt has right side twitching that began around 1040. Pt is on eliquis and has a pacemaker,  Pt is at baseline for mental status (dementia) but is not speaking normally.  Right side drift present.

## 2017-11-12 NOTE — Assessment & Plan Note (Signed)
Currently resides in memory care unit [facility], taking Namzaric 21/10mg  qd.

## 2017-11-12 NOTE — Procedures (Signed)
ELECTROENCEPHALOGRAM REPORT   Patient: Kristine Holden       Room #: C27C EEG No. ID: [id number] Age: 84 y.o.        Sex: female Referring Physician: Cheral Marker Report Date:  11/12/2017        Interpreting Physician: Alexis Goodell  History: [name] is an 84 y.o. female with new onset right sided weakness and change in speech  Medications:  Eliquis, Prevagen, Synthroid, Namzaric  Conditions of Recording:  This is a 21 channel routine scalp EEG performed with bipolar and monopolar montages arranged in accordance to the international 10/20 system of electrode placement. One channel was dedicated to EKG recording.  The patient is in the poorly responsive state.  Description:  The background activity is asymmetric.  The right hemispheric activity is low voltage and consists of diffusely distributed beta activity.  There are some occasional underlying slower frequencies noted as well.  The backgrounds activity over the left hemisphere is slower.  More theta activity is noted intermixed with the beta activity.  Also noted are frequent, intermittent bursts of moderate voltage polymorphic delta activity with intermixed parietal sharp transients.  These bursts can be short but often last from 20-40 seconds.  There is no change in clinical activity noted during these bursts.   Hyperventilation and intermittent photic stimulation were not performed.      IMPRESSION: This is an abnormal electroencephalogram secondary to frequent, intermittent periods of slowing with intermixed parietal sharp transients.  Findings concerning for subclinical seizure activity.     Results discussed with NH on call    Alexis Goodell, MD Neurology [phone] 11/12/2017, 1:48 PM

## 2017-11-12 NOTE — Assessment & Plan Note (Signed)
taking Levothyroxine 60mcg, last TSH 1.71 08/12/17

## 2017-11-12 NOTE — Assessment & Plan Note (Signed)
TIA vs acute CVA in setting of Hx of Afib, will have ED evaluation

## 2017-11-12 NOTE — Progress Notes (Signed)
Location:   SNF [facility] Room Number: N 110 A Place of Service:  SNF (31) Provider: Lennie Odor Quadasia Newsham NP  Blanchie Serve, MD  Patient Care Team: Blanchie Serve, MD as PCP - General (Internal Medicine)  Extended Emergency Contact Information Primary Emergency Contact: [name],[name] Address: [address] [place] [address] [name] of [place] Phone: [phone] Mobile Phone: [phone] Relation: Other  Code Status: DNR Goals of care: Advanced Directive information Advanced Directives 11/12/2017  Does Patient Have a Medical Advance Directive? Yes  Type of Advance Directive Out of facility DNR (pink MOST or yellow form)  Does patient want to make changes to medical advance directive? -  Copy of [place] in Chart? -  Would patient like information on creating a medical advance directive? -  Pre-existing out of facility DNR order (yellow form or pink MOST form) Yellow form placed in chart (order not valid for inpatient use)     Chief Complaint  Patient presents with  . Acute Visit    Resident is being seen due to difficuly concentrating and has right side weakness.     HPI:  Pt is a 84 y.o. female seen today for an acute visit for slurred speech and right sided weakness x1 day. 11/11/17 per nursing document: the patient was noted to be having trouble with her right hand coordination during ADLs and during breakfast. She was having difficulty picking up and using a spoon with her right hand, putting the spoon to her mouth/cloth protector on during meals. The patient's above change of condition was notified me by the staff nurse [facility] around 5:15pm 11/11/17, subsequently the pateitn was placed on  neuro checks, had ASA 81mg  x1, HPOA was updated the patient's condition and the plan of care. 11/12/17 persisted above symptoms, no living will located.  The patient denied headache, dizziness, change of vision, chest pain/pressrue,  palpitation, nausea, vomiting. Hx of Afib, heart rate is in control, on Eliquis 5mg  bid. Dementia, on Namzaric 21/10mg  qd. Hypothyroidism, on Levothyroxine 40mcg, last TSH 1.71 08/12/17   Past Medical History:  Diagnosis Date  . Arthritis   . First degree AV block   . Hypothyroidism   . Hypothyroidism    left thyroid  . Lichen sclerosus   . OA (osteoarthritis)    hands  . Osteoporosis   . Paroxysmal atrial fibrillation (HCC)   . Sick sinus syndrome ([place])   . Vitamin D deficiency    Past Surgical History:  Procedure Laterality Date  . ABDOMINAL HYSTERECTOMY  1974  . APPENDECTOMY  1951  . EP IMPLANTABLE DEVICE N/A 08/13/2014   Procedure: Pacemaker Implant;  Surgeon: Deboraha Sprang, MD;  Location: [place] [facility];  Service: Cardiovascular;  Laterality: N/A;  . HAND SURGERY  20years ago   open dislocation  . TONSILLECTOMY  1942   x2  . TOTAL KNEE ARTHROPLASTY  01/25/2012   Procedure: TOTAL KNEE ARTHROPLASTY;  Surgeon: Gearlean Alf, MD;  Location: [facility];  Service: Orthopedics;  Laterality: Right;    Allergies  Allergen Reactions  . Augmentin [Amoxicillin-Pot Clavulanate] Nausea And Vomiting  . Quinine Derivatives Other (See Comments)    Pt states "im just allergic" unknown reaction  . Penicillins Rash    Allergies as of 11/12/2017      Reactions   Augmentin [amoxicillin-pot Clavulanate] Nausea And Vomiting   Quinine Derivatives Other (See Comments)   Pt states "im just  allergic" unknown reaction   Penicillins Rash      Medication List        Accurate as of 11/12/17 10:55 AM. Always use your most recent med list.          acetaminophen 500 MG tablet Commonly known as:  TYLENOL Take 500 mg by mouth every 6 (six) hours as needed (PAIN).   apixaban 5 MG Tabs tablet Commonly known as:  ELIQUIS Take 1 tablet (5 mg total) by mouth 2 (two) times daily.   levothyroxine 75 MCG tablet Commonly known as:  SYNTHROID, LEVOTHROID Take 75 mcg by mouth daily before  breakfast.   Lutein-Zeaxanthin 20-1 MG Caps Take 1 tablet by mouth daily.   Magnesium 500 MG Caps Take 1 capsule by mouth daily.   MUSCLE THERAPY/ARNICA Gel Apply 1 application topically at bedtime.   NAMZARIC 21-10 MG Cp24 Generic drug:  Memantine HCl-Donepezil HCl Take 1 tablet by mouth daily.   polyethylene glycol packet Commonly known as:  MIRALAX / GLYCOLAX Take 17 g by mouth daily.   PREVAGEN 10 MG Caps Generic drug:  Apoaequorin Take 1 capsule by mouth daily.   SYSTANE 0.4-0.3 % Soln Generic drug:  Polyethyl Glycol-Propyl Glycol Apply 1 drop to eye 2 (two) times daily. For both eyes.   triamcinolone cream 0.1 % Commonly known as:  KENALOG Apply 1 application topically 2 (two) times daily as needed. Apply to hands and legs      ROS was provided with assistance of staff Review of Systems  Constitutional: Positive for activity change, appetite change and fatigue. Negative for chills, diaphoresis and fever.  HENT: Positive for hearing loss. Negative for congestion and voice change.   Respiratory: Negative for cough, shortness of breath and wheezing.   Cardiovascular: Negative for chest pain, palpitations and leg swelling.  Gastrointestinal: Negative for abdominal distention, abdominal pain, constipation, diarrhea, nausea and vomiting.  Genitourinary: Negative for difficulty urinating, dysuria and urgency.  Musculoskeletal:       The patient was seen in bed  Skin: Negative for color change and pallor.  Neurological: Positive for facial asymmetry, speech difficulty, weakness and headaches. Negative for dizziness, tremors, seizures, syncope and light-headedness.       Dementia.   Psychiatric/Behavioral: Positive for confusion. Negative for agitation, behavioral problems, hallucinations and sleep disturbance. The patient is not nervous/anxious.     Immunization History  Administered Date(s) Administered  . Influenza-Unspecified 12/04/2015  . PPD Test 08/12/2015,  09/02/2015, 09/04/2015  . Pneumococcal Conjugate-13 10/13/2010  . Tdap 08/10/2014   Pertinent  Health Maintenance Due  Topic Date Due  . INFLUENZA VACCINE  12/16/2017 (Originally 09/16/2017)  . DEXA SCAN  Completed  . PNA vac Low Risk Adult  Completed   Fall Risk  10/26/2017 10/16/2016  Falls in the past year? No No   Functional Status Survey:    Vitals:   11/12/17 0915  BP: 138/76  Pulse: 83  Resp: 18  Temp: 98.4 F (36.9 C)  TempSrc: Oral  SpO2: 96%  Weight: 174 lb (78.9 kg)  Height: 5\' 6"  (1.676 m)   Body mass index is 28.08 kg/m. Physical Exam  Constitutional: She appears well-developed and well-nourished. No distress.  HENT:  Head: Normocephalic.  Eyes: Pupils are equal, round, and reactive to light. EOM are normal.  Neck: Normal range of motion. Neck supple. No JVD present. No thyromegaly present.  Cardiovascular: Normal rate.  No murmur heard. Irregular heart beats.   Pulmonary/Chest: Effort normal. She has no wheezes. She has  no rales.  Abdominal: Soft. Bowel sounds are normal. She exhibits no distension. There is no tenderness. There is no rebound and no guarding.  Musculoskeletal: She exhibits no edema.  Neurological: She is alert. She displays abnormal reflex. A cranial nerve deficit is present. She exhibits abnormal muscle tone. Coordination abnormal.  Oriented to self. Slightly right facial weakness. Slurred speech. Follows simple directions. Right arm muscle strength 2-3/5, right leg muscle strength 3-4/5.   Skin: Skin is warm and dry. She is not diaphoretic.  Psychiatric: She has a normal mood and affect. Her behavior is normal.    Labs reviewed: Recent Labs    11/17/16 12/22/16 08/12/17 08/24/17  NA 141 140  --   --   K 4.0 4.3  --   --   BUN 13 16  --   --   CREATININE 0.8 0.8  --   --   CALCIUM  --   --  10.5 10.5   Recent Labs    11/17/16 02/05/17  AST 17 55*  ALT 15 79*  ALKPHOS 56 50   Recent Labs    03/23/17 08/12/17 10/14/17  WBC 5.4  4.7 5.2  HGB 13.3 13.9 13.1  HCT 38 41 39  PLT 157 152 153   Lab Results  Component Value Date   TSH 1.71 08/12/2017   No results found for: HGBA1C Lab Results  Component Value Date   CHOL 203 (A) 11/17/2016   HDL 39 11/17/2016   LDLCALC 126 11/17/2016   TRIG 236 (A) 11/17/2016    Significant Diagnostic Results in last 30 days:  No results found.  Assessment/Plan: Right sided weakness TIA vs acute CVA in setting of Hx of Afib, will have ED evaluation  Paroxysmal A-fib (HCC) Heart rate is in control. Currently taking Eliquis 5mg  bid for thromboembolic risk reduction.   Hypothyroidism taking Levothyroxine 75mcg, last TSH 1.71 08/12/17   Late onset Alzheimer's disease without behavioral disturbance Currently resides in memory care unit [facility], taking Namzaric 21/10mg  qd.     Family/ staff Communication: plan of care reviewed with the patient and charge nurse.   Labs/tests ordered: none  Time spend 25 minutes.

## 2017-11-12 NOTE — H&P (Signed)
History and Physical    Kristine Holden ZOX:[id number] DOB: Jan 10, 1928 DOA: 11/12/2017  PCP: Blanchie Serve, MD Patient coming from: Nursing home  Chief Complaint: Weakness and slurred speech  HPI: Kristine Holden is a 84 y.o. female with medical history significant of with past medical history of atrial fibrillation on Eliquis, hypothyroidism, dementia, osteoarthritis, AV block with pacemaker in place was sent to the hospital for evaluation of right-sided weakness and slurred speech.  Apparently patient developed right-sided arm weakness yesterday and over there she was given some aspirin but today developed slurred speech December she was brought to the hospital for further evaluation.  History is extremely limited from the patient as she is not able to effectively communicate due to her slurred speech and dementia.  No family member the bedside.  She does have most form which states DNR/DNI. Patient had a CT of the head done in the ER which showed ring-enhancing mass in the posterior left frontal lobe with vasogenic edema concerning for abscess versus malignancy versus hematoma this is 26 x 14 x 26 mm.  Patient was seen by neurology in the ER.  There was concerns that she may have had a partial seizure in the ER due to twitching of her face and her arm.  Loading dose of IV Keppra was started and was given 1 time dose of 2 mg of IV Ativan.  EEG was started as well and medical team was requested to admit the patient.   Review of Systems: As per HPI otherwise 10 point review of systems negative.  Review of Systems Unable to obtain full review of system given her mentation.  Past Medical History:  Diagnosis Date  . Arthritis   . First degree AV block   . Hypothyroidism   . Hypothyroidism    left thyroid  . Lichen sclerosus   . OA (osteoarthritis)    hands  . Osteoporosis   . Paroxysmal atrial fibrillation (HCC)   . Sick sinus syndrome ([place])   . Vitamin D deficiency     Past Surgical  History:  Procedure Laterality Date  . ABDOMINAL HYSTERECTOMY  1974  . APPENDECTOMY  1951  . EP IMPLANTABLE DEVICE N/A 08/13/2014   Procedure: Pacemaker Implant;  Surgeon: Deboraha Sprang, MD;  Location: [facility];  Service: Cardiovascular;  Laterality: N/A;  . HAND SURGERY  20years ago   open dislocation  . TONSILLECTOMY  1942   x2  . TOTAL KNEE ARTHROPLASTY  01/25/2012   Procedure: TOTAL KNEE ARTHROPLASTY;  Surgeon: Gearlean Alf, MD;  Location: [facility];  Service: Orthopedics;  Laterality: Right;    SOCIAL HISTORY:  reports that she has never smoked. She has never used smokeless tobacco. She reports that she does not drink alcohol or use drugs.  Allergies  Allergen Reactions  . Augmentin [Amoxicillin-Pot Clavulanate] Nausea And Vomiting  . Quinine Derivatives Other (See Comments)    Pt states "im just allergic" unknown reaction  . Penicillins Rash    FAMILY HISTORY: Family History  Problem Relation Age of Onset  . Heart attack Father   . Heart disease Father      Prior to Admission medications   Medication Sig Start Date End Date Taking? Authorizing Provider  acetaminophen (TYLENOL) 500 MG tablet Take 500 mg by mouth every 6 (six) hours as needed (PAIN).    Yes [provider]  apixaban (ELIQUIS) 5 MG TABS tablet Take 1 tablet (5 mg total) by mouth 2 (two) times daily.  12/11/14  Yes Jerline Pain, MD  Apoaequorin (PREVAGEN) 10 MG CAPS Take 1 capsule by mouth daily.    Yes [provider]  Homeopathic Products (MUSCLE THERAPY/ARNICA) GEL Apply 1 application topically at bedtime.   Yes [provider]  levothyroxine (SYNTHROID, LEVOTHROID) 75 MCG tablet Take 75 mcg by mouth daily before breakfast.   Yes [provider]  Lutein-Zeaxanthin 20-1 MG CAPS Take 1 tablet by mouth daily.   Yes [provider]  Magnesium 500 MG CAPS Take 1 capsule by mouth daily.   Yes [provider]  Memantine HCl-Donepezil HCl  (NAMZARIC) 21-10 MG CP24 Take 1 tablet by mouth daily.   Yes [provider]  Polyethyl Glycol-Propyl Glycol (SYSTANE) 0.4-0.3 % SOLN Apply 1 drop to eye 2 (two) times daily. For both eyes.   Yes [provider]  polyethylene glycol (MIRALAX / GLYCOLAX) packet Take 17 g by mouth daily.   Yes [provider]  triamcinolone cream (KENALOG) 0.1 % Apply 1 application topically 2 (two) times daily as needed. Apply to hands and legs   Yes [provider]    Physical Exam: Vitals:   11/12/17 1300 11/12/17 1315 11/12/17 1330 11/12/17 1345  BP: 121/68 113/62 (!) 117/56 (!) 103/58  Pulse: 60 60 (!) 59 (!) 59  Resp: 14 17 16 20   Temp:      TempSrc:      SpO2: 99% 97% 95% 97%      Constitutional: NAD, calm, comfortable; currently attached to EEG.  Elderly frail-appearing Eyes: PERRL, lids and conjunctivae normal ENMT: Mucous membranes are dry posterior pharynx clear of any exudate or lesions.Normal dentition.  Neck: normal, supple, no masses, no thyromegaly Respiratory: clear to auscultation bilaterally, no wheezing, no crackles. Normal respiratory effort. No accessory muscle use.  Cardiovascular: Regular rate and rhythm, no murmurs / rubs / gallops. No extremity edema. 2+ pedal pulses. No carotid bruits.  Abdomen: no tenderness, no masses palpated. No hepatosplenomegaly. Bowel sounds positive.  Musculoskeletal: no clubbing / cyanosis.  Skin: no rashes, lesions, ulcers. No induration Neurologic: Unable to get full neuro exam as patient is laying in her bed mumbling words.  It appears that she is trying to say something.  She clearly does respond to her name but unable to communicate clearly. Psychiatric: N unable to fully assess    Labs on Admission: I have personally reviewed following labs and imaging studies  CBC: Recent Labs  Lab 11/12/17 1131 11/12/17 1136  WBC 9.2  --   NEUTROABS 6.7  --   HGB 14.0 14.6  HCT 42.5 43.0  MCV 95.9  --   PLT 193   --    Basic Metabolic Panel: Recent Labs  Lab 11/12/17 1131 11/12/17 1136  NA 139 139  K 4.2 4.2  CL 109 107  CO2 23  --   GLUCOSE 108* 104*  BUN 10 11  CREATININE 0.73 0.70  CALCIUM 10.5*  --    GFR: Estimated Creatinine Clearance: 51.5 mL/min (by C-G formula based on SCr of 0.7 mg/dL). Liver Function Tests: Recent Labs  Lab 11/12/17 1131  AST 24  ALT 26  ALKPHOS 68  BILITOT 0.9  PROT 6.6  ALBUMIN 3.4*   No results for input(s): LIPASE, AMYLASE in the last 168 hours. No results for input(s): AMMONIA in the last 168 hours. Coagulation Profile: Recent Labs  Lab 11/12/17 1131  INR 1.13   Cardiac Enzymes: No results for input(s): CKTOTAL, CKMB, CKMBINDEX, TROPONINI in the last  168 hours. BNP (last 3 results) No results for input(s): PROBNP in the last 8760 hours. HbA1C: No results for input(s): HGBA1C in the last 72 hours. CBG: No results for input(s): GLUCAP in the last 168 hours. Lipid Profile: No results for input(s): CHOL, HDL, LDLCALC, TRIG, CHOLHDL, LDLDIRECT in the last 72 hours. Thyroid Function Tests: No results for input(s): TSH, T4TOTAL, FREET4, T3FREE, THYROIDAB in the last 72 hours. Anemia Panel: No results for input(s): VITAMINB12, FOLATE, FERRITIN, TIBC, IRON, RETICCTPCT in the last 72 hours. Urine analysis:    Component Value Date/Time   COLORURINE YELLOW 08/10/2014 1834   APPEARANCEUR CLEAR 08/10/2014 1834   LABSPEC 1.009 08/10/2014 1834   PHURINE 7.5 08/10/2014 1834   GLUCOSEU NEGATIVE 08/10/2014 1834   HGBUR NEGATIVE 08/10/2014 1834   BILIRUBINUR NEGATIVE 08/10/2014 1834   KETONESUR 15 (A) 08/10/2014 1834   PROTEINUR NEGATIVE 08/10/2014 1834   UROBILINOGEN 0.2 08/10/2014 1834   NITRITE NEGATIVE 08/10/2014 1834   LEUKOCYTESUR NEGATIVE 08/10/2014 1834   Sepsis Labs: !!!!!!!!!!!!!!!!!!!!!!!!!!!!!!!!!!!!!!!!!!!! @LABRCNTIP (procalcitonin:4,lacticidven:4) )No results found for this or any previous visit (from the past 240 hour(s)).    Radiological Exams on Admission: Ct Head W Contrast  Result Date: 11/12/2017 CLINICAL DATA:  Right-sided weakness, focal seizure. EXAM: CT HEAD WITH CONTRAST TECHNIQUE: Contiguous axial images were obtained from the base of the skull through the vertex with intravenous contrast. CONTRAST:  135mL OMNIPAQUE IOHEXOL 300 MG/ML  SOLN COMPARISON:  Head CT 08/10/2014 FINDINGS: Brain: 23 x 14 x 26 mm ring-enhancing mass in the posterior left frontal lobe with vasogenic edema. No second mass is seen. There is a small focus of gliosis in the anterior left frontal lobe where there was traumatic hemorrhage in 2016. Generalized atrophy with ventriculomegaly. No infarct, high-density blood products, hydrocephalus, or collection. Vascular: No hyperdense vessel or unexpected calcification. Visible vessels are patent. Skull: No acute or aggressive finding. Sinuses/Orbits: Negative IMPRESSION: 23 x 14 x 26 mm solitary ring-enhancing mass in the posterior left frontal lobe with vasogenic edema. Abscess, malignancy, or subacute hematoma could have this appearance. Electronically Signed   By: Monte Fantasia M.D.   On: 11/12/2017 14:39   Ct Head Code Stroke Wo Contrast  Result Date: 11/12/2017 CLINICAL DATA:  Code stroke.  Right-sided weakness and aphasia. EXAM: CT HEAD WITHOUT CONTRAST TECHNIQUE: Contiguous axial images were obtained from the base of the skull through the vertex without intravenous contrast. COMPARISON:  08/10/2014 FINDINGS: Brain: There is moderately extensive vasogenic edema in the posterior left frontal lobe extending from the vertex into the operculum with the overlying cortex largely appearing preserved. An underlying centrally hypoattenuating lesion is suspected. No midline shift, acute intracranial hemorrhage, or extra-axial fluid collection is identified. There is mild-to-moderate cerebral atrophy. Vascular: Calcified atherosclerosis at the skull base. No hyperdense vessel. Skull: No acute fracture  or focal osseous lesion. Interval healing of the depressed posterior skull fracture on the prior study. Sinuses/Orbits: Minimal right sphenoid sinus fluid. Moderate right mastoid effusion. Bilateral cataract extraction. Other: None. ASPECTS [facility] Stroke Program Early CT Score) Not scored due to suspected mass. IMPRESSION: 1. Extensive posterior left frontal lobe vasogenic edema with suspected underlying mass or possibly infection/abscess. Contrast-enhanced brain MRI is recommended for further evaluation. 2. No acute intracranial hemorrhage or definite infarct. These results were communicated to Dr. Cheral Marker at 11:43 am on 11/12/2017 by text page via the [facility] messaging system. Electronically Signed   By: Logan Bores M.D.   On: 11/12/2017 11:45     All images have been reviewed  by me personally.    Assessment/Plan Principal Problem:   Seizure ([place]) Active Problems:   OA (osteoarthritis) of knee   First degree AV block   Sinus bradycardia   SAH (subarachnoid hemorrhage) (HCC)   Hypothyroidism   Paroxysmal A-fib (HCC)    Acute slurred speech and right-sided weakness Facial twitching concerns for seizures Extensive posterior left frontal lobe edema concerning for mass versus infection versus abscess versus hematoma - Admit the patient to the stepdown unit for closer monitoring due to concerns of recurrent seizures.  Currently patient is on EEG. -Neurology has been consulted. -CT that shows extensive posterior left frontal lobe vasogenic edema with concerns for partial complex seizures -Patient is on Eliquis but due to also concerns for hematoma I will hold off on any anticoagulation. - Ativan as necessary, Keppra load has been started followed by 500 mg IV twice daily -Repeat CT head with contrast.  Seizure precautions, neurochecks  History of atrial fibrillation -Currently rate controlled.  Unable to take any p.o. medications.  Eliquis is on hold due to concerns of hematoma.  History  of AV block - Appears that she has a pacemaker in place.  All checks x-ray from 2016 reviewed.  Not a candidate for MRI.  History of dementia -Holding off on p.o. medications.  History of hypothyroidism -Check TSH, will convert p.o. levothyroxine to IV Synthroid.    DVT prophylaxis: SCDs Code Status: DNR Family Communication: None  Disposition Plan: To be determined Consults called: Neurology Admission status: Stepdown   Time Spent: 65 minutes.  >50% of the time was devoted to discussing the patients care, assessment, plan and disposition with other care givers along with counseling the patient about the risks and benefits of treatment.   Please Note: This patient record was dictated using Editor, commissioning. Chart creation errors have been sought, but may not always have been located. Such creation errors do not reflect on the Standard of Medical Care.   Ankit Arsenio Loader MD [facility] Pager [phone]  If 7PM-7AM, please contact night-coverage [email or web address] Password [facility]  11/12/2017, 4:07 PM

## 2017-11-12 NOTE — Progress Notes (Signed)
Transported eeg equipment with Pt to [id number]. Pt's EEG is running

## 2017-11-12 NOTE — ED Provider Notes (Signed)
[facility] EMERGENCY DEPARTMENT Provider Note   CSN: [id number] Arrival date & time: 11/12/17  1047     History   Chief Complaint No chief complaint on file.   HPI Kristine Holden is a 84 y.o. female.  The history is provided by the patient and the EMS personnel. No language interpreter was used.   Kristine Holden is a 84 y.o. female who presents to the Emergency Department complaining of weakness. She presents to the emergency department with complaints of right sided weakness that began at 1300 yesterday. Level V caveat due to dementia and aphasia. She developed sudden onset right upper extremity weakness that began yesterday. She did have some subtle twitching to the face and arm that started around 1040 today. Past Medical History:  Diagnosis Date  . Arthritis   . First degree AV block   . Hypothyroidism   . Hypothyroidism    left thyroid  . Lichen sclerosus   . OA (osteoarthritis)    hands  . Osteoporosis   . Paroxysmal atrial fibrillation (HCC)   . Sick sinus syndrome ([place])   . Vitamin D deficiency     Patient Active Problem List   Diagnosis Date Noted  . Right sided weakness 11/12/2017  . Subconjunctival hematoma, right 10/13/2017  . Hyperlipidemia 01/19/2017  . Liver function test abnormality 01/19/2017  . Weight loss 01/18/2017  . Allergic rhinitis 01/15/2017  . Hypercalcemia 12/11/2016  . Osteoporosis without current pathological fracture 11/12/2016  . Atypical mole 10/01/2016  . Chronic anticoagulation 09/16/2016  . Chronic constipation 09/16/2016  . Late onset Alzheimer's disease without behavioral disturbance 07/21/2016  . Paroxysmal A-fib ([place]) 07/21/2016  . 1st degree AV block 08/12/2014  . Hypothyroidism 08/11/2014  . Stenosed aortic valve 08/11/2014  . Lichen sclerosus 26/94/8546  . Tachycardia with sick sinus syndrome, CHad2Vasc2=3, HASBLED=1 08/11/2014  . History of fracture of skull   . Atrial fibrillation with RVR ([place])  08/10/2014  . SAH (subarachnoid hemorrhage) ([place]) 08/10/2014  . Syncope and collapse 08/10/2014  . S/P cataract surgery, right eye 08/10/2014  . First degree AV block 01/04/2014  . Primary osteoarthritis of left knee 01/04/2014  . Sinus bradycardia 01/04/2014  . OA (osteoarthritis) of knee 01/25/2012    Past Surgical History:  Procedure Laterality Date  . ABDOMINAL HYSTERECTOMY  1974  . APPENDECTOMY  1951  . EP IMPLANTABLE DEVICE N/A 08/13/2014   Procedure: Pacemaker Implant;  Surgeon: Deboraha Sprang, MD;  Location: [facility];  Service: Cardiovascular;  Laterality: N/A;  . HAND SURGERY  20years ago   open dislocation  . TONSILLECTOMY  1942   x2  . TOTAL KNEE ARTHROPLASTY  01/25/2012   Procedure: TOTAL KNEE ARTHROPLASTY;  Surgeon: Gearlean Alf, MD;  Location: [facility];  Service: Orthopedics;  Laterality: Right;     OB History   None      Home Medications    Prior to Admission medications   Medication Sig Start Date End Date Taking? Authorizing Provider  acetaminophen (TYLENOL) 500 MG tablet Take 500 mg by mouth every 6 (six) hours as needed (PAIN).    Yes [provider]  apixaban (ELIQUIS) 5 MG TABS tablet Take 1 tablet (5 mg total) by mouth 2 (two) times daily. 12/11/14  Yes Jerline Pain, MD  Apoaequorin (PREVAGEN) 10 MG CAPS Take 1 capsule by mouth daily.    Yes [provider]  Homeopathic Products (MUSCLE THERAPY/ARNICA) GEL Apply 1 application topically at bedtime.  Yes [provider]  levothyroxine (SYNTHROID, LEVOTHROID) 75 MCG tablet Take 75 mcg by mouth daily before breakfast.   Yes [provider]  Lutein-Zeaxanthin 20-1 MG CAPS Take 1 tablet by mouth daily.   Yes [provider]  Magnesium 500 MG CAPS Take 1 capsule by mouth daily.   Yes [provider]  Memantine HCl-Donepezil HCl (NAMZARIC) 21-10 MG CP24 Take 1 tablet by mouth daily.   Yes [provider]  Polyethyl Glycol-Propyl Glycol  (SYSTANE) 0.4-0.3 % SOLN Apply 1 drop to eye 2 (two) times daily. For both eyes.   Yes [provider]  polyethylene glycol (MIRALAX / GLYCOLAX) packet Take 17 g by mouth daily.   Yes [provider]  triamcinolone cream (KENALOG) 0.1 % Apply 1 application topically 2 (two) times daily as needed. Apply to hands and legs   Yes [provider]    Family History Family History  Problem Relation Age of Onset  . Heart attack Father   . Heart disease Father     Social History Social History   Tobacco Use  . Smoking status: Never Smoker  . Smokeless tobacco: Never Used  Substance Use Topics  . Alcohol use: No  . Drug use: No     Allergies   Augmentin [amoxicillin-pot clavulanate]; Quinine derivatives; and Penicillins   Review of Systems Review of Systems  Unable to perform ROS: Dementia     Physical Exam Updated Vital Signs BP (!) 103/58   Pulse (!) 59   Temp 98.7 F (37.1 C) (Oral)   Resp 20   LMP  (LMP Unknown)   SpO2 97%   Physical Exam  Constitutional: She appears well-developed and well-nourished.  HENT:  Head: Normocephalic and atraumatic.  Cardiovascular: Normal rate and regular rhythm.  No murmur heard. Pulmonary/Chest: Effort normal and breath sounds normal. No respiratory distress.  Abdominal: Soft. There is no tenderness. There is no rebound and no guarding.  Musculoskeletal: She exhibits no edema or tenderness.  Neurological: She is alert.  Follow simple commands. Expressive aphasia. Weakness of the right upper extremity with drift. Mild right facial weakness. There is a slight twitch to the right cheek, right shoulder. Sensation to light touch intact in all four extremities. Five out of five strength and left upper extremity, bilateral lower extremities. Visual fields are grossly intact.  Skin: Skin is warm and dry.  Psychiatric: She has a normal mood and affect. Her behavior is normal.  Nursing note and vitals  reviewed.    ED Treatments / Results  Labs (all labs ordered are listed, but only abnormal results are displayed) Labs Reviewed  COMPREHENSIVE METABOLIC PANEL - Abnormal; Notable for the following components:      Result Value   Glucose, Bld 108 (*)    Calcium 10.5 (*)    Albumin 3.4 (*)    All other components within normal limits  I-STAT CHEM 8, ED - Abnormal; Notable for the following components:   Glucose, Bld 104 (*)    All other components within normal limits  ETHANOL  PROTIME-INR  APTT  CBC  DIFFERENTIAL  RAPID URINE DRUG SCREEN, HOSP PERFORMED  URINALYSIS, ROUTINE W REFLEX MICROSCOPIC  I-STAT TROPONIN, ED    EKG EKG Interpretation  Date/Time:  Friday November 12 2017 10:49:55 EDT Ventricular Rate:  60 PR Interval:    QRS Duration: 157 QT Interval:  464 QTC Calculation: 464 R Axis:   -74 Text Interpretation:  Sinus rhythm Prolonged PR interval Nonspecific IVCD with  LAD LVH with secondary repolarization abnormality Confirmed by Quintella Reichert [phone]) on 11/12/2017 11:13:25 AM Also confirmed by Quintella Reichert [phone]), editor Lynder Parents [phone])  on 11/12/2017 3:44:37 PM   Radiology Ct Head W Contrast  Result Date: 11/12/2017 CLINICAL DATA:  Right-sided weakness, focal seizure. EXAM: CT HEAD WITH CONTRAST TECHNIQUE: Contiguous axial images were obtained from the base of the skull through the vertex with intravenous contrast. CONTRAST:  124mL OMNIPAQUE IOHEXOL 300 MG/ML  SOLN COMPARISON:  Head CT 08/10/2014 FINDINGS: Brain: 23 x 14 x 26 mm ring-enhancing mass in the posterior left frontal lobe with vasogenic edema. No second mass is seen. There is a small focus of gliosis in the anterior left frontal lobe where there was traumatic hemorrhage in 2016. Generalized atrophy with ventriculomegaly. No infarct, high-density blood products, hydrocephalus, or collection. Vascular: No hyperdense vessel or unexpected calcification. Visible vessels are patent. Skull: No acute  or aggressive finding. Sinuses/Orbits: Negative IMPRESSION: 23 x 14 x 26 mm solitary ring-enhancing mass in the posterior left frontal lobe with vasogenic edema. Abscess, malignancy, or subacute hematoma could have this appearance. Electronically Signed   By: Monte Fantasia M.D.   On: 11/12/2017 14:39   Ct Head Code Stroke Wo Contrast  Result Date: 11/12/2017 CLINICAL DATA:  Code stroke.  Right-sided weakness and aphasia. EXAM: CT HEAD WITHOUT CONTRAST TECHNIQUE: Contiguous axial images were obtained from the base of the skull through the vertex without intravenous contrast. COMPARISON:  08/10/2014 FINDINGS: Brain: There is moderately extensive vasogenic edema in the posterior left frontal lobe extending from the vertex into the operculum with the overlying cortex largely appearing preserved. An underlying centrally hypoattenuating lesion is suspected. No midline shift, acute intracranial hemorrhage, or extra-axial fluid collection is identified. There is mild-to-moderate cerebral atrophy. Vascular: Calcified atherosclerosis at the skull base. No hyperdense vessel. Skull: No acute fracture or focal osseous lesion. Interval healing of the depressed posterior skull fracture on the prior study. Sinuses/Orbits: Minimal right sphenoid sinus fluid. Moderate right mastoid effusion. Bilateral cataract extraction. Other: None. ASPECTS [facility] Stroke Program Early CT Score) Not scored due to suspected mass. IMPRESSION: 1. Extensive posterior left frontal lobe vasogenic edema with suspected underlying mass or possibly infection/abscess. Contrast-enhanced brain MRI is recommended for further evaluation. 2. No acute intracranial hemorrhage or definite infarct. These results were communicated to Dr. Cheral Marker at 11:43 am on 11/12/2017 by text page via the [facility] messaging system. Electronically Signed   By: Logan Bores M.D.   On: 11/12/2017 11:45    Procedures Procedures (including critical care time) CRITICAL  CARE Performed by: Quintella Reichert   Total critical care time: 40 minutes  Critical care time was exclusive of separately billable procedures and treating other patients.  Critical care was necessary to treat or prevent imminent or life-threatening deterioration.  Critical care was time spent personally by me on the following activities: development of treatment plan with patient and/or surrogate as well as nursing, discussions with consultants, evaluation of patient's response to treatment, examination of patient, obtaining history from patient or surrogate, ordering and performing treatments and interventions, ordering and review of laboratory studies, ordering and review of radiographic studies, pulse oximetry and re-evaluation of patient's condition.  Medications Ordered in ED Medications  levETIRAcetam (KEPPRA) IVPB 500 mg/100 mL premix (has no administration in time range)  LORazepam (ATIVAN) injection 2 mg (2 mg Intravenous Given 11/12/17 1138)  levETIRAcetam (KEPPRA) IVPB 1500 mg/ 100 mL premix (0 mg Intravenous Stopped 11/12/17 1303)  iohexol (OMNIPAQUE) 300 MG/ML solution 100  mL (100 mLs Intravenous Contrast Given 11/12/17 1408)     Initial Impression / Assessment and Plan / ED Course  I have reviewed the triage vital signs and the nursing notes.  Pertinent labs & imaging results that were available during my care of the patient were reviewed by me and considered in my medical decision making (see chart for details).    patient here for evaluation of right arm weakness and difficulty speaking since yesterday. Patient with expressive aphasia, small amount of twitching to face and arm concerning for possible seizure. Twitching did develop about an hour prior to ED arrival. Code stroke was activated due to patient having deficits and being LVL positive. She was evaluated by neurology in the emergency department and code stroke was canceled. She was treated with Ativan and Keppra for  seizures. Due to her pacemaker she is not an MRI candidate and a CT head with contrast was obtained to further evaluate brain mass. Seizure activity did appear to terminate after Ativan and Keppra administration. Discussed with patient's niece and power of attorney findings of studies and recommendation for admission. She states the patient is DNR and would not want resuscitation or invasive interventions such as mechanical ventilation or surgical procedures. These plans to come from [place] by tomorrow and she is reachable by phone. Hospitalist consulted for admission for further workup and treatment.  Gabriel Rainwater, Niece and POA [phone] (work until [employer]) [phone] (cell phone)   Final Clinical Impressions(s) / ED Diagnoses   Final diagnoses:  None    ED Discharge Orders    None       Quintella Reichert, MD 11/12/17 1606

## 2017-11-12 NOTE — Progress Notes (Signed)
Pt pulled off all EEG electrodes. No skin breakdown . EEG leads re-applied.

## 2017-11-12 NOTE — Assessment & Plan Note (Signed)
Heart rate is in control. Currently taking Eliquis 5mg  bid for thromboembolic risk reduction.

## 2017-11-12 NOTE — Code Documentation (Signed)
84yo female arriving to [facility] via [place] at 36. Patient from nursing facility where she was noted to have difficulty using her right hand yesterday. Today patient's symptoms persisted and EMS was called. Patient presented to the ED where a code stroke was called. Patient to CT. Stroke team to the bedside. CT completed. NIHSS 8, see documentation for details and code stroke times. Of note, patient with h/o dementia and takes Eliquis for atrial fibrillation. Patient with expressive aphasia on exam, but able to follow simple commands. She has difficulty with complex commands. Patient with right arm and leg drift as well as focal right face and arm twitching on exam. Dr. Cheral Marker to the bedside. CT reviewed. Patient is outside the window and contraindicated for tPA. Patient is not an endovascular intervention candidate due to changes on CT. No acute stroke treatment at this time. Ativan ordered. Bedside handoff with ED RN Lonn Georgia.

## 2017-11-12 NOTE — ED Notes (Signed)
Patient transported to CT 

## 2017-11-12 NOTE — Progress Notes (Signed)
RN went to check on pt and found pt attempting to get out of bed. Pt is confused , not following command. Pt has pulled off all her EEG strips, telemetry, purewhick and gown. RN has tried to re-orient pt; but not successful. MD made aware.

## 2017-11-12 NOTE — Consult Note (Addendum)
NEURO HOSPITALIST CONSULT NOTE   Requesting physician: Dr. Ralene Bathe  Reason for Consult: Right sided weakness with twitching  History obtained from:   Chart    HPI:                                                                                                                                          Kristine Holden is an 84 y.o. female with Alzheimer disease, atrial fibrillation on Eliquis, and hypothyroidism on levothyroxine, presenting via EMS with right sided weakness first noticed yesterday at 1300. She has not been able to walk since then. At baseline she ambulates without assistance. Speech also has not been as clear as normal. Today at 1040, right sided twitching was also noted. She has a pacemaker.   Past Medical History:  Diagnosis Date  . Arthritis   . First degree AV block   . Hypothyroidism   . Hypothyroidism    left thyroid  . Lichen sclerosus   . OA (osteoarthritis)    hands  . Osteoporosis   . Paroxysmal atrial fibrillation (HCC)   . Sick sinus syndrome ([place])   . Vitamin D deficiency     Past Surgical History:  Procedure Laterality Date  . ABDOMINAL HYSTERECTOMY  1974  . APPENDECTOMY  1951  . EP IMPLANTABLE DEVICE N/A 08/13/2014   Procedure: Pacemaker Implant;  Surgeon: Deboraha Sprang, MD;  Location: [facility];  Service: Cardiovascular;  Laterality: N/A;  . HAND SURGERY  20years ago   open dislocation  . TONSILLECTOMY  1942   x2  . TOTAL KNEE ARTHROPLASTY  01/25/2012   Procedure: TOTAL KNEE ARTHROPLASTY;  Surgeon: Gearlean Alf, MD;  Location: [facility];  Service: Orthopedics;  Laterality: Right;    Family History  Problem Relation Age of Onset  . Heart attack Father   . Heart disease Father               Social History:  reports that she has never smoked. She has never used smokeless tobacco. She reports that she does not drink alcohol or use drugs.  Allergies  Allergen Reactions  . Augmentin [Amoxicillin-Pot Clavulanate]  Nausea And Vomiting  . Quinine Derivatives Other (See Comments)    Pt states "im just allergic" unknown reaction  . Penicillins Rash    HOME MEDICATIONS:  ROS:                                                                                                                                       Unable to obtain due to aphasia. She does not appear to be in any pain.    Blood pressure 136/66, pulse (!) 59, temperature 98.7 F (37.1 C), temperature source Oral, resp. rate 14, SpO2 98 %.   General Examination:                                                                                                       Physical Exam  HEENT-  [place]/AT   Lungs- Respirations unlabored Extremities- No edema  Neurological Examination Mental Status: Awake and alert. Receptive and expressive aphasia noted. She has dysarthric, fragmentary, hesitant speech with inappropriate, garbled words. Understands about 40% of simple motor commands.  Cranial Nerves: II: Will gaze towards visual stimuli in left and right temporal hemifields. Unable to assess visual fields more definitively due to aphasia. PERRL.   III,IV, VI: Squints right eye. EOMI with saccadic pursuits noted. No nystagmus. No forced gaze deviation.  V,VII: No definite facial droop. Equivocal sensory function on the right. Intermittent twitching of right side of face VIII: hearing intact to voice IX,X: Does not open mouth for assessment of palate XI: Head at midline XII: midline tongue extension Motor: RUE: Flailing movements in conjunction with poor motor control when asked to elevate UE antigravity. Will resist examiner with 4/5 strength proximal and distal. Intermittent twitching of RUE.  RLE: 4/5 with drift LUE and LLE: 5/5 No rigidity or spasticity noted.  Sensory: Reacts less briskly to right upper and lower extremity  stimulation than on the left. Indicates that she can feel temperature sensation bilaterally to UEs Deep Tendon Reflexes: 1+ bilateral biceps and brachioradialis. 1+ patellae bilaterally. 0 achilles bilaterally. Right toe upgoing, left toe downgoing.   Cerebellar: No ataxia with FNF on left. Severe dysmetria with RUE movements.  Gait: Deferred   Lab Results: Basic Metabolic Panel: No results for input(s): NA, K, CL, CO2, GLUCOSE, BUN, CREATININE, CALCIUM, MG, PHOS in the last 168 hours.  CBC: No results for input(s): WBC, NEUTROABS, HGB, HCT, MCV, PLT in the last 168 hours.  Cardiac Enzymes: No results for input(s): CKTOTAL, CKMB, CKMBINDEX, TROPONINI in the last 168 hours.  Lipid Panel: No results for input(s): CHOL, TRIG, HDL, CHOLHDL, VLDL, LDLCALC in the last 168 hours.   CT head: 1. Extensive posterior left  frontal lobe vasogenic edema with suspected underlying mass or possibly infection/abscess. Contrast-enhanced brain MRI is recommended for further evaluation. 2. No acute intracranial hemorrhage or definite infarct.  Spot EEG report conclusions:  This is an abnormal electroencephalogram secondary to frequent, intermittent periods of slowing with intermixed parietal sharp transients.  Findings concerning for subclinical seizure activity.  Assessment: 84 year old female with acute onset right sided weakness yesterday at 1300, followed by twitching of RUE and face beginning at 1040 today 1. CT head reveals extensive posterior left frontal lobe vasogenic edema with suspected underlying mass or possibly infection/abscess. 2. Not a tPA candidate due to time criteria. Not an endovascular candidate due to probable left cerebral hemisphere mass 3. Probable partial complex seizure. Most likely secondary to the lesion seen on CT head.  4. Atrial fibrillation on Eliquis  Recommendations: 1. STAT Ativan IV 2 mg 2. Load with Keppra 1500 mg IV x 1, then 500 mg IV BID scheduled 3. STAT  EEG 4. CT head with contrast 5. Seizure precautions 6. Frequent neuro checks.  7. Continue Eliquis  40 minutes spent in the emergent neurological evaluation and management of this critically ill patient  Addendum 1:44 PM:  - Keppra load completed - Electrographic seizures have resolved. Discussed with Dr. Doy Mince.  - Is sleeping currently, without clinical seizure activity - CT head with contrast is pending - LTM EEG running. EEG tech will help with disconnecting/reconnecting EEG machine for CT with contrast.   Addendum 8:24 PM: Patient has removed leads. EEG tech called in to replace. Will need two-point wrist restraints (ordered). LTM EEG tracings from prior to lead removal reviewed, with diffuse slowing but no electrographic seizures seen.   Electronically signed: Dr. Kerney Elbe 11/12/2017, 11:34 AM

## 2017-11-13 ENCOUNTER — Inpatient Hospital Stay (HOSPITAL_COMMUNITY): Payer: Medicare Other

## 2017-11-13 DIAGNOSIS — G40901 Epilepsy, unspecified, not intractable, with status epilepticus: Secondary | ICD-10-CM

## 2017-11-13 LAB — COMPREHENSIVE METABOLIC PANEL
ALBUMIN: 3.4 g/dL — AB (ref 3.5–5.0)
ALT: 26 U/L (ref 0–44)
AST: 38 U/L (ref 15–41)
Alkaline Phosphatase: 73 U/L (ref 38–126)
Anion gap: 7 (ref 5–15)
BUN: 8 mg/dL (ref 8–23)
CHLORIDE: 106 mmol/L (ref 98–111)
CO2: 22 mmol/L (ref 22–32)
CREATININE: 0.76 mg/dL (ref 0.44–1.00)
Calcium: 10.1 mg/dL (ref 8.9–10.3)
GFR calc non Af Amer: >60 mL/min (ref >60)
Glucose, Bld: 134 mg/dL — ABNORMAL HIGH (ref 70–99)
Potassium: 4.5 mmol/L (ref 3.5–5.1)
SODIUM: 135 mmol/L (ref 135–145)
Total Bilirubin: 1.7 mg/dL — ABNORMAL HIGH (ref 0.3–1.2)
Total Protein: 6.6 g/dL (ref 6.5–8.1)

## 2017-11-13 LAB — GLUCOSE, CAPILLARY
GLUCOSE-CAPILLARY: 129 mg/dL — AB (ref 70–99)
GLUCOSE-CAPILLARY: 117 mg/dL — AB (ref 70–99)

## 2017-11-13 LAB — PROTIME-INR
INR: 1.07
Prothrombin Time: 13.8 seconds (ref 11.4–15.2)

## 2017-11-13 LAB — CBC
HEMATOCRIT: 43.3 % (ref 36.0–46.0)
HEMOGLOBIN: 14.1 g/dL (ref 12.0–15.0)
MCH: 31.2 pg (ref 26.0–34.0)
MCHC: 32.6 g/dL (ref 30.0–36.0)
MCV: 95.8 fL (ref 78.0–100.0)
Platelets: 186 10*3/uL (ref 150–400)
RBC: 4.52 MIL/uL (ref 3.87–5.11)
RDW: 14.1 % (ref 11.5–15.5)
WBC: 10.5 10*3/uL (ref 4.0–10.5)

## 2017-11-13 LAB — APTT: aPTT: 28 seconds (ref 24–36)

## 2017-11-13 MED ORDER — SODIUM CHLORIDE 0.9 % IV SOLN
1.0000 g | INTRAVENOUS | Status: DC
  Administered 2017-11-13 – 2017-11-14 (×2): 1 g via INTRAVENOUS
  Filled 2017-11-13 (×3): qty 10

## 2017-11-13 NOTE — Progress Notes (Signed)
Subjective: Has been agitated, requiring restraints. Continues to be aphasic.  Objective: Current vital signs: BP (!) 128/54 (BP Location: Left Arm)   Pulse 61   Temp 98.6 F (37 C) (Axillary)   Resp 19   LMP  (LMP Unknown)   SpO2 95%  Vital signs in last 24 hours: Temp:  [97.7 F (36.5 C)-98.8 F (37.1 C)] 98.6 F (37 C) (09/28 0500) Pulse Rate:  [59-83] 61 (09/28 0743) Resp:  [11-24] 19 (09/28 0743) BP: (103-151)/(54-87) 128/54 (09/28 0743) SpO2:  [95 %-100 %] 95 % (09/28 0743) Weight:  [78.9 kg] 78.9 kg (09/27 0915)  Intake/Output from previous day: 09/27 0701 - 09/28 0700 In: 673.4 [I.V.:473.4; IV Piggyback:200] Out: 700 [Urine:700] Intake/Output this shift: No intake/output data recorded. Nutritional status:  Diet Order            Diet NPO time specified  Diet effective now              Neurologic Exam: Ment: Awake and alert. Speech dysarthric, halting, fragmentary and unintelligible. Unable to follow most verbal commands.  CN: EOMI. PERRL. No nystagmus. Mild right facial droop.  Motor:  LUE and LLE: 5/5 RUE and RLE 3-4/5. Moves RUE and RLE less briskly than on the left.  Sensory: Reacts to tactile stimulation.  Reflexes: Unremarkable.   Lab Results: Results for orders placed or performed during the hospital encounter of 11/12/17 (from the past 48 hour(s))  Ethanol     Status: None   Collection Time: 11/12/17 11:30 AM  Result Value Ref Range   Alcohol, Ethyl (B) <10 <10 mg/dL    Comment: (NOTE) Lowest detectable limit for serum alcohol is 10 mg/dL. For medical purposes only. Performed at [facility], [place] [address], [place], [place] [address]   Protime-INR     Status: None   Collection Time: 11/12/17 11:31 AM  Result Value Ref Range   Prothrombin Time 14.4 11.4 - 15.2 seconds   INR 1.13     Comment: Performed at [facility] [address] [place], [place] [address]  APTT     Status: None   Collection Time: 11/12/17 11:31 AM   Result Value Ref Range   aPTT 28 24 - 36 seconds    Comment: Performed at [facility] [address], [place] [address]  CBC     Status: None   Collection Time: 11/12/17 11:31 AM  Result Value Ref Range   WBC 9.2 4.0 - 10.5 K/uL   RBC 4.43 3.87 - 5.11 MIL/uL   Hemoglobin 14.0 12.0 - 15.0 g/dL   HCT 42.5 36.0 - 46.0 %   MCV 95.9 78.0 - 100.0 fL   MCH 31.6 26.0 - 34.0 pg   MCHC 32.9 30.0 - 36.0 g/dL   RDW 14.1 11.5 - 15.5 %   Platelets 193 150 - 400 K/uL    Comment: Performed at [facility], [place] [address] [place], [place] [address]  Differential     Status: None   Collection Time: 11/12/17 11:31 AM  Result Value Ref Range   Neutrophils Relative % 74 %   Neutro Abs 6.7 1.7 - 7.7 K/uL   Lymphocytes Relative 18 %   Lymphs Abs 1.7 0.7 - 4.0 K/uL   Monocytes Relative 7 %   Monocytes Absolute 0.7 0.1 - 1.0 K/uL   Eosinophils Relative 1 %   Eosinophils Absolute 0.1 0.0 - 0.7 K/uL   Basophils Relative 0 %  Basophils Absolute 0.0 0.0 - 0.1 K/uL   Immature Granulocytes 0 %   Abs Immature Granulocytes 0.0 0.0 - 0.1 K/uL    Comment: Performed at [facility], [place] [address], [place], [place] [address]  Comprehensive metabolic panel     Status: Abnormal   Collection Time: 11/12/17 11:31 AM  Result Value Ref Range   Sodium 139 135 - 145 mmol/L   Potassium 4.2 3.5 - 5.1 mmol/L   Chloride 109 98 - 111 mmol/L   CO2 23 22 - 32 mmol/L   Glucose, Bld 108 (H) 70 - 99 mg/dL   BUN 10 8 - 23 mg/dL   Creatinine, Ser 0.73 0.44 - 1.00 mg/dL   Calcium 10.5 (H) 8.9 - 10.3 mg/dL   Total Protein 6.6 6.5 - 8.1 g/dL   Albumin 3.4 (L) 3.5 - 5.0 g/dL   AST 24 15 - 41 U/L   ALT 26 0 - 44 U/L   Alkaline Phosphatase 68 38 - 126 U/L   Total Bilirubin 0.9 0.3 - 1.2 mg/dL   GFR calc non Af Amer >60 >60 mL/min   GFR calc Af Amer >60 >60 mL/min    Comment: (NOTE) The eGFR has been calculated using the CKD EPI equation. This calculation has not been validated in all clinical  situations. eGFR's persistently <60 mL/min signify possible Chronic Kidney Disease.    Anion gap 7 5 - 15    Comment: Performed at [facility] [address], [place], [place] [address]  I-stat troponin, ED     Status: None   Collection Time: 11/12/17 11:34 AM  Result Value Ref Range   Troponin i, poc 0.00 0.00 - 0.08 ng/mL   Comment 3            Comment: Due to the release kinetics of cTnI, a negative result within the first hours of the onset of symptoms does not rule out myocardial infarction with certainty. If myocardial infarction is still suspected, repeat the test at appropriate intervals.   I-Stat Chem 8, ED     Status: Abnormal   Collection Time: 11/12/17 11:36 AM  Result Value Ref Range   Sodium 139 135 - 145 mmol/L   Potassium 4.2 3.5 - 5.1 mmol/L   Chloride 107 98 - 111 mmol/L   BUN 11 8 - 23 mg/dL   Creatinine, Ser 0.70 0.44 - 1.00 mg/dL   Glucose, Bld 104 (H) 70 - 99 mg/dL   Calcium, Ion 1.34 1.15 - 1.40 mmol/L   TCO2 22 22 - 32 mmol/L   Hemoglobin 14.6 12.0 - 15.0 g/dL   HCT 43.0 36.0 - 46.0 %  Glucose, capillary     Status: None   Collection Time: 11/12/17  5:05 PM  Result Value Ref Range   Glucose-Capillary 86 70 - 99 mg/dL   Comment 1 Notify RN    Comment 2 Document in Chart   Magnesium     Status: None   Collection Time: 11/12/17  5:18 PM  Result Value Ref Range   Magnesium 2.0 1.7 - 2.4 mg/dL    Comment: Performed at [facility], [place] [address], [place], [place] [address]  TSH     Status: None   Collection Time: 11/12/17  5:18 PM  Result Value Ref Range   TSH 2.880 0.350 - 4.500 uIU/mL    Comment: Performed by a 3rd Generation assay with a functional sensitivity of <=0.01 uIU/mL. Performed at [facility],  [address], [place], [place] [address]   Urine rapid drug screen (hosp performed)     Status: None   Collection Time: 11/12/17  6:38 PM  Result Value Ref Range   Opiates NONE DETECTED NONE DETECTED   Cocaine NONE  DETECTED NONE DETECTED   Benzodiazepines NONE DETECTED NONE DETECTED   Amphetamines NONE DETECTED NONE DETECTED   Tetrahydrocannabinol NONE DETECTED NONE DETECTED   Barbiturates NONE DETECTED NONE DETECTED    Comment: (NOTE) DRUG SCREEN FOR MEDICAL PURPOSES ONLY.  IF CONFIRMATION IS NEEDED FOR ANY PURPOSE, NOTIFY LAB WITHIN 5 DAYS. LOWEST DETECTABLE LIMITS FOR URINE DRUG SCREEN Drug Class                     Cutoff (ng/mL) Amphetamine and metabolites    1000 Barbiturate and metabolites    200 Benzodiazepine                 962 Tricyclics and metabolites     300 Opiates and metabolites        300 Cocaine and metabolites        300 THC                            50 Performed at [facility], [place] [address] [place], [place] [address]   Urinalysis, Routine w reflex microscopic     Status: Abnormal   Collection Time: 11/12/17  6:38 PM  Result Value Ref Range   Color, Urine YELLOW YELLOW   APPearance CLEAR CLEAR   Specific Gravity, Urine >1.046 (H) 1.005 - 1.030   pH 7.0 5.0 - 8.0   Glucose, UA NEGATIVE NEGATIVE mg/dL   Hgb urine dipstick NEGATIVE NEGATIVE   Bilirubin Urine NEGATIVE NEGATIVE   Ketones, ur NEGATIVE NEGATIVE mg/dL   Protein, ur NEGATIVE NEGATIVE mg/dL   Nitrite NEGATIVE NEGATIVE   Leukocytes, UA MODERATE (A) NEGATIVE   RBC / HPF 0-5 0 - 5 RBC/hpf   WBC, UA 6-10 0 - 5 WBC/hpf   Bacteria, UA FEW (A) NONE SEEN   Squamous Epithelial / LPF 0-5 0 - 5   Amorphous Crystal PRESENT     Comment: Performed at [facility] [address], [place], [place] [address]  Glucose, capillary     Status: Abnormal   Collection Time: 11/13/17  4:38 AM  Result Value Ref Range   Glucose-Capillary 129 (H) 70 - 99 mg/dL  Comprehensive metabolic panel     Status: Abnormal   Collection Time: 11/13/17  4:39 AM  Result Value Ref Range   Sodium 135 135 - 145 mmol/L   Potassium 4.5 3.5 - 5.1 mmol/L   Chloride 106 98 - 111 mmol/L   CO2 22 22 - 32 mmol/L   Glucose, Bld 134  (H) 70 - 99 mg/dL   BUN 8 8 - 23 mg/dL   Creatinine, Ser 0.76 0.44 - 1.00 mg/dL   Calcium 10.1 8.9 - 10.3 mg/dL   Total Protein 6.6 6.5 - 8.1 g/dL   Albumin 3.4 (L) 3.5 - 5.0 g/dL   AST 38 15 - 41 U/L   ALT 26 0 - 44 U/L   Alkaline Phosphatase 73 38 - 126 U/L   Total Bilirubin 1.7 (H) 0.3 - 1.2 mg/dL   GFR calc non Af Amer >60 >60 mL/min   GFR calc Af Amer >60 >60 mL/min    Comment: (NOTE) The eGFR has been calculated  using the CKD EPI equation. This calculation has not been validated in all clinical situations. eGFR's persistently <60 mL/min signify possible Chronic Kidney Disease.    Anion gap 7 5 - 15    Comment: Performed at [facility] [address], [place] [address]  CBC     Status: None   Collection Time: 11/13/17  4:39 AM  Result Value Ref Range   WBC 10.5 4.0 - 10.5 K/uL   RBC 4.52 3.87 - 5.11 MIL/uL   Hemoglobin 14.1 12.0 - 15.0 g/dL   HCT 43.3 36.0 - 46.0 %   MCV 95.8 78.0 - 100.0 fL   MCH 31.2 26.0 - 34.0 pg   MCHC 32.6 30.0 - 36.0 g/dL   RDW 14.1 11.5 - 15.5 %   Platelets 186 150 - 400 K/uL    Comment: Performed at [facility], [place] [address], [place], [place] [address]  Protime-INR     Status: None   Collection Time: 11/13/17  4:39 AM  Result Value Ref Range   Prothrombin Time 13.8 11.4 - 15.2 seconds   INR 1.07     Comment: Performed at [facility] [address], [place], [place] [address]  APTT     Status: None   Collection Time: 11/13/17  4:39 AM  Result Value Ref Range   aPTT 28 24 - 36 seconds    Comment: Performed at [facility] [address], [place], [place] [address]    No results found for this or any previous visit (from the past 240 hour(s)).  Lipid Panel No results for input(s): CHOL, TRIG, HDL, CHOLHDL, VLDL, LDLCALC in the last 72 hours.  Studies/Results: Ct Head W Contrast  Result Date: 11/12/2017 CLINICAL DATA:  Right-sided weakness, focal seizure. EXAM: CT HEAD WITH CONTRAST TECHNIQUE:  Contiguous axial images were obtained from the base of the skull through the vertex with intravenous contrast. CONTRAST:  180m OMNIPAQUE IOHEXOL 300 MG/ML  SOLN COMPARISON:  Head CT 08/10/2014 FINDINGS: Brain: 23 x 14 x 26 mm ring-enhancing mass in the posterior left frontal lobe with vasogenic edema. No second mass is seen. There is a small focus of gliosis in the anterior left frontal lobe where there was traumatic hemorrhage in 2016. Generalized atrophy with ventriculomegaly. No infarct, high-density blood products, hydrocephalus, or collection. Vascular: No hyperdense vessel or unexpected calcification. Visible vessels are patent. Skull: No acute or aggressive finding. Sinuses/Orbits: Negative IMPRESSION: 23 x 14 x 26 mm solitary ring-enhancing mass in the posterior left frontal lobe with vasogenic edema. Abscess, malignancy, or subacute hematoma could have this appearance. Electronically Signed   By: JMonte FantasiaM.D.   On: 11/12/2017 14:39   Ct Head Code Stroke Wo Contrast  Result Date: 11/12/2017 CLINICAL DATA:  Code stroke.  Right-sided weakness and aphasia. EXAM: CT HEAD WITHOUT CONTRAST TECHNIQUE: Contiguous axial images were obtained from the base of the skull through the vertex without intravenous contrast. COMPARISON:  08/10/2014 FINDINGS: Brain: There is moderately extensive vasogenic edema in the posterior left frontal lobe extending from the vertex into the operculum with the overlying cortex largely appearing preserved. An underlying centrally hypoattenuating lesion is suspected. No midline shift, acute intracranial hemorrhage, or extra-axial fluid collection is identified. There is mild-to-moderate cerebral atrophy. Vascular: Calcified atherosclerosis at the skull base. No hyperdense vessel. Skull: No acute fracture or focal osseous lesion. Interval healing of the depressed posterior skull fracture on the prior study. Sinuses/Orbits: Minimal right sphenoid sinus  fluid. Moderate right  mastoid effusion. Bilateral cataract extraction. Other: None. ASPECTS [facility] Stroke Program Early CT Score) Not scored due to suspected mass. IMPRESSION: 1. Extensive posterior left frontal lobe vasogenic edema with suspected underlying mass or possibly infection/abscess. Contrast-enhanced brain MRI is recommended for further evaluation. 2. No acute intracranial hemorrhage or definite infarct. These results were communicated to Dr. Cheral Marker at 11:43 am on 11/12/2017 by text page via the [facility] messaging system. Electronically Signed   By: Logan Bores M.D.   On: 11/12/2017 11:45    Medications:  Scheduled: . insulin aspart  0-9 Units Subcutaneous TID WC  . levothyroxine  50 mcg Intravenous Daily   Continuous: . dextrose 5 % and 0.45% NaCl 50 mL/hr at 11/13/17 0403  . levETIRAcetam Stopped (11/12/17 2320)    LTM EEG report for this morning (9/28): Background activities marked by slowing with more prominent slowing across left hemisphere. Faster frequencies were noted across right hemisphere with paracentral dominance.  Several brief electrographic seizures were present during first part of the recording maximum negativity at the left central parietal region.  Last seizure around 1330. Since that time background activities were marked by continues a left hemispheric slowing in the delta range attenuated.  Superimposed left central parietal sharp waves present. Clinical interpretation: This is day 1 of intensive EEG monitoring with simultaneous video monitoring is abnormal for several reasons #1 persistent left hemispheric slowing and attenuation suggestive of neuronal dysfunction on a structural vascular or degenerative basis across left hemispheric.  #2 several brief electrographic seizures arising from left central parietal cortex were present during first half of the recording.  #3 left central parietal sharp waves were noted throughout the remaining of the recording.  These findings suggestive of  neuronal dysfunction and cortical irritability across left hemisphere particular left central parietal cortex.    Assessment: 84 year old female with newly diagnosed irregular ring enhancing left posterior frontal lobe lesion with surrounding vasogenic edema. Presented with new onset partial complex seizure.  1. CT head with contrast reveals a 23 x 14 x 26 mm solitary ring-enhancing mass in the posterior left frontal lobe with vasogenic edema. Abscess, malignancy, or subacute hematoma could have this appearance. The best option for narrowing the DDx for the left frontal lobe lesion would be an MRI brain. 4. Atrial fibrillation on Eliquis as outpatient  Recommendations: 1. Eliquis has been discontinued, as subacute ICH is on the DDx for the left frontal lobe lesion. Benefits of discontinuation are significantly greater than risks of being off Eliquis until work up can be completed.  2. Start SCDs for DVT prophylaxis (ordered).  3. Continue Keppra at 500 mg IV BID 4. Discontinue LTM EEG.  5. CT of chest/abdomen/pelvis to with contrast to assess for possible primary malignancy, as metastasis is on the DDx for the left frontal lobe lesion. 6. Cardiology consult to assist with determining if her pacemaker is MRI-safe and if so, assistance for deactivating/reactivating pacemaker when obtaining MRI.  7. If MRI is possible, obtain with and without contrast 8. Empiric antibiotic treatment for suspected cerebral abscess is recommended. Will need ID input for this.  9. LP with relatively high probability of being non-diagnostic if this is a cerebral abscess, as the lesion is intraaxial and the patient is without neck stiffness or fever to suggest meningeal involvement. Also some risk for LP given presence of mass lesion, although it does not look highly worrisome for sufficient mass effect to precipitate herniation if an LP is determined to  be the only diagnostic alternative. If an LP is performed, will  require to be at least 24 hours off Eliquis.  10. If MRI shows findings most consistent with abscess, continued empiric antibiotic treatment versus aspiration of abscess contents by Neurosurgery followed by microscopic examination, culture and sensitivity would be the two main options.  11. TTE to assess for possible SBE.      LOS: 1 day   @Electronically  signed: Dr. Kerney Elbe 11/13/2017  8:15 AM

## 2017-11-13 NOTE — Procedures (Signed)
Electroencephalography report.  Long-term monitoring  Day 1 CPT 95951 Recording begins 11/12/2017 at 11 23  Recording ends 11/13/2017 at 07 30  Data acquisition: International 10-20 for  electrode placement.  18 channels EEG and EKG.  This EEG was requested for this patient with suspected seizures  Background activities marked by slowing with more prominent slowing across left hemisphere.  Faster frequencies were noted across right hemisphere with paracentral dominance.  Several brief electrographic seizures were present during first part of the recording maximum negativity at the left central parietal region.  Last seizure around 1330. Since that time background activities were marked by continues a left hemispheric slowing in the delta range attenuated.  Superimposed left central parietal sharp waves present.  Clinical interpretation this is day 1 of intensive EEG monitoring with simultaneous video monitoring is abnormal for several reasons #1 persistent left hemispheric slowing and attenuation suggestive of neuronal dysfunction on a structural vascular or degenerative basis across left hemispheric.  #2 several brief electrographic seizures arising from left central parietal cortex were present during first half of the recording.  #3 left central parietal sharp waves were noted throughout the remaining of the recording.  These findings suggestive of neuronal dysfunction and cortical irritability across left hemisphere particular left central parietal cortex.  Clinical correlation is advised

## 2017-11-13 NOTE — Progress Notes (Addendum)
PROGRESS NOTE    Kristine Holden  QVZ:[id number] DOB: [birth year] DOA: 11/12/2017 PCP: Blanchie Serve, MD   Brief Narrative:  Kristine Holden is a 84 y.o. female with medical history significant of with past medical history of atrial fibrillation on Eliquis, hypothyroidism, dementia, osteoarthritis, AV block with pacemaker in place was sent to the hospital for evaluation of right-sided weakness and slurred speech. CT of the head done in the ER which showed ring-enhancing mass in the posterior left frontal lobe with vasogenic edema concerning for abscess versus malignancy versus hematoma this is 26 x 14 x 26 mm.  Patient was seen by neurology in the ER.  There was concerns that she may have had a partial seizure in the ER due to twitching of her face and her arm.  Loading dose of IV Keppra was started and was given 1 time dose of 2 mg of IV Ativan.  EEG was started as well and medical team was requested to admit the patient.   Assessment & Plan:   Principal Problem:   Seizure ([place]) Active Problems:   OA (osteoarthritis) of knee   First degree AV block   Sinus bradycardia   SAH (subarachnoid hemorrhage) (HCC)   Hypothyroidism   Paroxysmal A-fib (HCC)  Acute slurred speech and right-sided weakness, still persist Facial twitching concerns for seizures, still persist Extensive posterior left frontal lobe edema concerning for mass versus infection versus abscess versus hematoma - Maintain stepdown unit stay for closer monitoring. -Appreciate neurology input -CT that shows extensive posterior left frontal lobe vasogenic edema with concerns for partial complex seizures -Patient is on Eliquis but due to also concerns for hematoma I will hold off on any anticoagulation. - Ativan as necessary, Keppra load has been started followed by 500 mg IV twice daily. EEG shows- seizure spikes -Seizure precautions and neurochecks -Spoke with Dr Rayann Heman and Keenan Bachelor [facility] rep). Her Pacemaker isnt MRI compatible  therefore will coordinate with MRI team to deactivate and reactive for MRI.  - Neurology considering possible underlying malignancy therefore may want CT chest abdomen pelvis, LP?. -Echocardiogram ordered to look for any intracardiac pathology. -Will also spoke with ID for their input in case if we empirically need to tx with Abx.   Addendum 3pm= Spoke with Dr Baxter Flattery around 12pm about her case. Recommends holding off any any Abx for cerebral abscess until MRI Brain is done. Suspicion is low at the moment. She will be avaliable tomorrow as well to further discuss once MRI is back.   Appears patient has UTI as well therefore will empirically start her on Rocephin, In the meantime Bcx and Ucx done.   History of atrial fibrillation -Currently rate controlled.  Unable to take any p.o. medications.  Eliquis is on hold due to concerns of hematoma.  History of AV block with Sinus Bradycardia - Appears that she has a pacemaker in place.  All checks x-ray from 2016 reviewed.  Not a candidate for MRI- but asked Cardiology for assistance.   History of dementia -Holding off on p.o. medications.  History of hypothyroidism -TSH = WNL, will convert p.o. levothyroxine to IV Synthroid.   DVT prophylaxis: SCDs Code Status: DNR Family Communication:  TBD Disposition Plan: Cont Stepdown  Consultants:   Neurology- spoke with Dr Luciana Axe   Curbsided Cardiology to help with ICD for MRI  Procedures:   EEG  Antimicrobials:   None   Subjective: Patient is laying in the bed and opens her eyes to the name and  she traces across the room.  Does not follow any other commands.  Review of Systems Otherwise negative except as per HPI, including: Difficult to obtain given her mentation  Objective: Vitals:   11/12/17 2100 11/13/17 0007 11/13/17 0500 11/13/17 0743  BP: 138/76 (!) 142/63 120/60 (!) 128/54  Pulse: 74 68 60 61  Resp: (!) 21 (!) 21 (!) 21 19  Temp: 98.2 F (36.8 C) 98.8 F (37.1 C)  98.6 F (37 C)   TempSrc: Oral Axillary Axillary   SpO2: 96% 97% 99% 95%    Intake/Output Summary (Last 24 hours) at 11/13/2017 0900 Last data filed at 11/13/2017 0403 Gross per 24 hour  Intake 673.42 ml  Output 700 ml  Net -26.58 ml   There were no vitals filed for this visit.  Examination:  General exam: Awake and alert but does not follow commands.  She tries to respond to her name but unable to communicate.  Traces me across the room.  Currently EEG electrodes in place. Respiratory system: Clear to auscultation. Respiratory effort normal. Cardiovascular system: S1 & S2 heard, RRR. No JVD, murmurs, rubs, gallops or clicks. No pedal edema. Gastrointestinal system: Abdomen is nondistended, soft and nontender. No organomegaly or masses felt. Normal bowel sounds heard. Central nervous system: Alert and follows basic commands but unable to perform full neurologic exam given her mentation. Extremities: No gross deformity Skin: No rashes, lesions or ulcers Psychiatry: Unable to fully assess    Data Reviewed:   CBC: Recent Labs  Lab 11/12/17 1131 11/12/17 1136 11/13/17 0439  WBC 9.2  --  10.5  NEUTROABS 6.7  --   --   HGB 14.0 14.6 14.1  HCT 42.5 43.0 43.3  MCV 95.9  --  95.8  PLT 193  --  563   Basic Metabolic Panel: Recent Labs  Lab 11/12/17 1131 11/12/17 1136 11/12/17 1718 11/13/17 0439  NA 139 139  --  135  K 4.2 4.2  --  4.5  CL 109 107  --  106  CO2 23  --   --  22  GLUCOSE 108* 104*  --  134*  BUN 10 11  --  8  CREATININE 0.73 0.70  --  0.76  CALCIUM 10.5*  --   --  10.1  MG  --   --  2.0  --    GFR: Estimated Creatinine Clearance: 51.5 mL/min (by C-G formula based on SCr of 0.76 mg/dL). Liver Function Tests: Recent Labs  Lab 11/12/17 1131 11/13/17 0439  AST 24 38  ALT 26 26  ALKPHOS 68 73  BILITOT 0.9 1.7*  PROT 6.6 6.6  ALBUMIN 3.4* 3.4*   No results for input(s): LIPASE, AMYLASE in the last 168 hours. No results for input(s): AMMONIA in  the last 168 hours. Coagulation Profile: Recent Labs  Lab 11/12/17 1131 11/13/17 0439  INR 1.13 1.07   Cardiac Enzymes: No results for input(s): CKTOTAL, CKMB, CKMBINDEX, TROPONINI in the last 168 hours. BNP (last 3 results) No results for input(s): PROBNP in the last 8760 hours. HbA1C: No results for input(s): HGBA1C in the last 72 hours. CBG: Recent Labs  Lab 11/12/17 1705 11/13/17 0438  GLUCAP 86 129*   Lipid Profile: No results for input(s): CHOL, HDL, LDLCALC, TRIG, CHOLHDL, LDLDIRECT in the last 72 hours. Thyroid Function Tests: Recent Labs    11/12/17 1718  TSH 2.880   Anemia Panel: No results for input(s): VITAMINB12, FOLATE, FERRITIN, TIBC, IRON, RETICCTPCT in the last 72 hours. Sepsis  Labs: No results for input(s): PROCALCITON, LATICACIDVEN in the last 168 hours.  No results found for this or any previous visit (from the past 240 hour(s)).       Radiology Studies: Ct Head W Contrast  Result Date: 11/12/2017 CLINICAL DATA:  Right-sided weakness, focal seizure. EXAM: CT HEAD WITH CONTRAST TECHNIQUE: Contiguous axial images were obtained from the base of the skull through the vertex with intravenous contrast. CONTRAST:  11mL OMNIPAQUE IOHEXOL 300 MG/ML  SOLN COMPARISON:  Head CT 08/10/2014 FINDINGS: Brain: 23 x 14 x 26 mm ring-enhancing mass in the posterior left frontal lobe with vasogenic edema. No second mass is seen. There is a small focus of gliosis in the anterior left frontal lobe where there was traumatic hemorrhage in 2016. Generalized atrophy with ventriculomegaly. No infarct, high-density blood products, hydrocephalus, or collection. Vascular: No hyperdense vessel or unexpected calcification. Visible vessels are patent. Skull: No acute or aggressive finding. Sinuses/Orbits: Negative IMPRESSION: 23 x 14 x 26 mm solitary ring-enhancing mass in the posterior left frontal lobe with vasogenic edema. Abscess, malignancy, or subacute hematoma could have this  appearance. Electronically Signed   By: Monte Fantasia M.D.   On: 11/12/2017 14:39   Ct Head Code Stroke Wo Contrast  Result Date: 11/12/2017 CLINICAL DATA:  Code stroke.  Right-sided weakness and aphasia. EXAM: CT HEAD WITHOUT CONTRAST TECHNIQUE: Contiguous axial images were obtained from the base of the skull through the vertex without intravenous contrast. COMPARISON:  08/10/2014 FINDINGS: Brain: There is moderately extensive vasogenic edema in the posterior left frontal lobe extending from the vertex into the operculum with the overlying cortex largely appearing preserved. An underlying centrally hypoattenuating lesion is suspected. No midline shift, acute intracranial hemorrhage, or extra-axial fluid collection is identified. There is mild-to-moderate cerebral atrophy. Vascular: Calcified atherosclerosis at the skull base. No hyperdense vessel. Skull: No acute fracture or focal osseous lesion. Interval healing of the depressed posterior skull fracture on the prior study. Sinuses/Orbits: Minimal right sphenoid sinus fluid. Moderate right mastoid effusion. Bilateral cataract extraction. Other: None. ASPECTS [facility] Stroke Program Early CT Score) Not scored due to suspected mass. IMPRESSION: 1. Extensive posterior left frontal lobe vasogenic edema with suspected underlying mass or possibly infection/abscess. Contrast-enhanced brain MRI is recommended for further evaluation. 2. No acute intracranial hemorrhage or definite infarct. These results were communicated to Dr. Cheral Marker at 11:43 am on 11/12/2017 by text page via the [facility] messaging system. Electronically Signed   By: Logan Bores M.D.   On: 11/12/2017 11:45        Scheduled Meds: . insulin aspart  0-9 Units Subcutaneous TID WC  . levothyroxine  50 mcg Intravenous Daily   Continuous Infusions: . dextrose 5 % and 0.45% NaCl 50 mL/hr at 11/13/17 0403  . levETIRAcetam Stopped (11/12/17 2320)     LOS: 1 day   Time spent= 40  mins    Ankit Arsenio Loader, MD [facility] Pager [phone]   If 7PM-7AM, please contact night-coverage [email or web address] Password [email or web address] 11/13/2017, 9:00 AM

## 2017-11-13 NOTE — Progress Notes (Signed)
11/13/17 @ 15:21, pt has unsafe pacemaker for MRI. RN aware.

## 2017-11-13 NOTE — Progress Notes (Signed)
EEG leads removed, no skin break down.

## 2017-11-13 NOTE — Progress Notes (Signed)
SLP Cancellation Note  Patient Details Name: DAILA ELBERT MRN: [id number] DOB: 03/04/1927   Cancelled treatment:       Reason Eval/Treat Not Completed: Other (comment). Despite max interventions pt refused any PO. Alert but still very confused.  Would advise allowing RN to offer sips of water and puree and call SLP if concerning signs of aspiration occur. Otherwise will f/u for assessment tomorrow.   Herbie [place], MA CCC-SLP  Acute Rehabilitation Services Pager [phone] W/e pager after 1 pm: [phone] Office [phone]  Lynann Beaver 11/13/2017, 7:35 AM

## 2017-11-14 ENCOUNTER — Inpatient Hospital Stay (HOSPITAL_COMMUNITY): Payer: Medicare Other

## 2017-11-14 DIAGNOSIS — I5030 Unspecified diastolic (congestive) heart failure: Secondary | ICD-10-CM

## 2017-11-14 LAB — GLUCOSE, CAPILLARY
GLUCOSE-CAPILLARY: 142 mg/dL — AB (ref 70–99)
Glucose-Capillary: 135 mg/dL — ABNORMAL HIGH (ref 70–99)
Glucose-Capillary: 149 mg/dL — ABNORMAL HIGH (ref 70–99)

## 2017-11-14 LAB — ECHOCARDIOGRAM COMPLETE
Height: 66 in
Weight: 2828.94 oz

## 2017-11-14 MED ORDER — MEMANTINE HCL ER 7 MG PO CP24
21.0000 mg | ORAL_CAPSULE | Freq: Every day | ORAL | Status: DC
  Administered 2017-11-14 – 2017-11-19 (×6): 21 mg via ORAL
  Filled 2017-11-14 (×6): qty 1

## 2017-11-14 MED ORDER — LEVOTHYROXINE SODIUM 75 MCG PO TABS
75.0000 ug | ORAL_TABLET | Freq: Every day | ORAL | Status: DC
  Administered 2017-11-15 – 2017-11-19 (×5): 75 ug via ORAL
  Filled 2017-11-14 (×5): qty 1

## 2017-11-14 MED ORDER — CHLORHEXIDINE GLUCONATE 0.12 % MT SOLN
15.0000 mL | Freq: Two times a day (BID) | OROMUCOSAL | Status: DC
  Administered 2017-11-14 – 2017-11-19 (×11): 15 mL via OROMUCOSAL
  Filled 2017-11-14 (×11): qty 15

## 2017-11-14 MED ORDER — POLYETHYLENE GLYCOL 3350 17 G PO PACK
17.0000 g | PACK | Freq: Every day | ORAL | Status: DC
  Administered 2017-11-15 – 2017-11-19 (×5): 17 g via ORAL
  Filled 2017-11-14 (×6): qty 1

## 2017-11-14 MED ORDER — MEMANTINE HCL-DONEPEZIL HCL ER 21-10 MG PO CP24: 1.0000 | ORAL_CAPSULE | Freq: Every day | ORAL | Status: DC

## 2017-11-14 MED ORDER — POLYETHYL GLYCOL-PROPYL GLYCOL 0.4-0.3 % OP SOLN: 1.0000 [drp] | Freq: Two times a day (BID) | OPHTHALMIC | Status: DC

## 2017-11-14 MED ORDER — DONEPEZIL HCL 10 MG PO TABS
10.0000 mg | ORAL_TABLET | Freq: Every day | ORAL | Status: DC
  Administered 2017-11-14 – 2017-11-19 (×6): 10 mg via ORAL
  Filled 2017-11-14 (×6): qty 1

## 2017-11-14 MED ORDER — ORAL CARE MOUTH RINSE
15.0000 mL | Freq: Two times a day (BID) | OROMUCOSAL | Status: DC
  Administered 2017-11-14 – 2017-11-19 (×8): 15 mL via OROMUCOSAL

## 2017-11-14 MED ORDER — IOHEXOL 300 MG/ML  SOLN
100.0000 mL | Freq: Once | INTRAMUSCULAR | Status: AC | PRN
  Administered 2017-11-14: 100 mL via INTRAVENOUS

## 2017-11-14 MED ORDER — POLYVINYL ALCOHOL 1.4 % OP SOLN
1.0000 [drp] | OPHTHALMIC | Status: DC | PRN
  Administered 2017-11-14 – 2017-11-16 (×2): 1 [drp] via OPHTHALMIC
  Filled 2017-11-14: qty 15

## 2017-11-14 NOTE — Evaluation (Signed)
Clinical/Bedside Swallow Evaluation Patient Details  Name: Kristine Holden MRN: [id number] Date of Birth: 07-30-1927  Today's Date: 11/14/2017 Time: SLP Start Time (ACUTE ONLY): 0840 SLP Stop Time (ACUTE ONLY): 0910 SLP Time Calculation (min) (ACUTE ONLY): 30 min  Past Medical History:  Past Medical History:  Diagnosis Date  . Arthritis   . First degree AV block   . Hypothyroidism   . Hypothyroidism    left thyroid  . Lichen sclerosus   . OA (osteoarthritis)    hands  . Osteoporosis   . Paroxysmal atrial fibrillation (HCC)   . Sick sinus syndrome ([place])   . Vitamin D deficiency    Past Surgical History:  Past Surgical History:  Procedure Laterality Date  . ABDOMINAL HYSTERECTOMY  1974  . APPENDECTOMY  1951  . EP IMPLANTABLE DEVICE N/A 08/13/2014   Procedure: Pacemaker Implant;  Surgeon: Deboraha Sprang, MD;  Location: [place] [facility];  Service: Cardiovascular;  Laterality: N/A;  . HAND SURGERY  20years ago   open dislocation  . TONSILLECTOMY  1942   x2  . TOTAL KNEE ARTHROPLASTY  01/25/2012   Procedure: TOTAL KNEE ARTHROPLASTY;  Surgeon: Gearlean Alf, MD;  Location: [facility];  Service: Orthopedics;  Laterality: Right;   HPI:  Kristine Holden is a 84 y.o. female with medical history significant of with past medical history of atrial fibrillation on Eliquis, hypothyroidism, dementia, osteoarthritis, AV block with pacemaker in place was sent to the hospital for evaluation of right-sided weakness and slurred speech. Patient had a CT of the head done in the ER which showed ring-enhancing mass in the posterior left frontal lobe with vasogenic edema concerning for abscess versus malignancy versus hematoma this is 26 x 14 x 26 mm.  Patient was seen by neurology in the ER.  There was concerns that she may have had a partial seizure in the ER due to twitching of her face and her arm.  Loading dose of IV Keppra was started and was given 1 time dose of 2 mg of IV Ativan.     Assessment /  Plan / Recommendation Clinical Impression   Patient is alert and more cooperative with assessment today. Difficult to assess orientation given aphasia. She follows motor commands with visual cues; initially has difficulty with tongue protrusion, question oral apraxia. No apparent weakness, though her speech is dysarthric and paraphasic. She is able to self-feed with set up and occasional assistance; initially has difficulty with proprioception when bringing spoon to mouth. There are no overt signs of aspiration with any consistency, and she passes 3 oz water swallow challenge. Mild intermittent oral holding with liquids. In general her movements are slowed; most notable with prolonged mastication of cracker. She had difficulty biting regular solid cracker; this appears due to cognitive impairments vs weakness. Recommend initiating dys 3 (mechanical soft) with thin liquids, medications whole in puree; aspiration risk mild due to cognitive impairments. Advancement likely with improvements in mentation. Will follow.   SLP Visit Diagnosis: Dysphagia, unspecified (R13.10)    Aspiration Risk  Mild aspiration risk    Diet Recommendation Dysphagia 3 (Mech soft);Thin liquid   Liquid Administration via: Cup;Straw Medication Administration: Whole meds with puree Supervision: Assist with self-feeding (needs set up and occasional assistance) Compensations: Slow rate;Small sips/bites    Other  Recommendations Oral Care Recommendations: Oral care BID   Follow up Recommendations Other (comment)(tbd)      Frequency and Duration min 1 x/week  1 week  Prognosis Prognosis for Safe Diet Advancement: Good Barriers to Reach Goals: Cognitive deficits;Language deficits      Swallow Study   General Date of Onset: 11/12/17 HPI: Kristine Holden is a 84 y.o. female with medical history significant of with past medical history of atrial fibrillation on Eliquis, hypothyroidism, dementia, osteoarthritis, AV  block with pacemaker in place was sent to the hospital for evaluation of right-sided weakness and slurred speech. Patient had a CT of the head done in the ER which showed ring-enhancing mass in the posterior left frontal lobe with vasogenic edema concerning for abscess versus malignancy versus hematoma this is 26 x 14 x 26 mm.  Patient was seen by neurology in the ER.  There was concerns that she may have had a partial seizure in the ER due to twitching of her face and her arm.  Loading dose of IV Keppra was started and was given 1 time dose of 2 mg of IV Ativan.   Type of Study: Bedside Swallow Evaluation Diet Prior to this Study: NPO Temperature Spikes Noted: No Respiratory Status: Room air History of Recent Intubation: No Behavior/Cognition: Alert;Cooperative Oral Cavity Assessment: Dry Oral Care Completed by SLP: Yes Oral Cavity - Dentition: Adequate natural dentition Vision: Functional for self-feeding Self-Feeding Abilities: Needs assist;Needs set up Patient Positioning: Upright in bed Baseline Vocal Quality: Normal(speech is dysarthric) Volitional Cough: Cognitively unable to elicit Volitional Swallow: Able to elicit    Oral/Motor/Sensory Function Overall Oral Motor/Sensory Function: Within functional limits(tongue midline, no facial droop or noted weakness)   Ice Chips Ice chips: Impaired Oral Phase Functional Implications: Oral holding;Prolonged oral transit   Thin Liquid Thin Liquid: Impaired Presentation: Cup;Straw;Self Fed Oral Phase Impairments: Reduced labial seal(with cup; appears cognitive vs weakness) Oral Phase Functional Implications: Left anterior spillage Pharyngeal  Phase Impairments: (WFL)    Nectar Thick Nectar Thick Liquid: Not tested   Honey Thick Honey Thick Liquid: Not tested   Puree Puree: Within functional limits Presentation: Self Fed;Spoon Other Comments: proprioception impaired when self feeding with spoon   Solid     Solid: Impaired Presentation:  Self Fed Oral Phase Impairments: Poor awareness of bolus;Impaired mastication Oral Phase Functional Implications: Prolonged oral transit;Impaired mastication     Kristine Holden, [place], CCC-SLP Speech-Language Pathologist Acute Rehabilitation Services Pager: [phone] Office: [phone]  Kristine Holden 11/14/2017,9:22 AM

## 2017-11-14 NOTE — Progress Notes (Signed)
  Echocardiogram 2D Echocardiogram has been performed.  Kristine Holden 11/14/2017, 4:39 PM

## 2017-11-14 NOTE — Progress Notes (Signed)
[place] TEAM 1 - Stepdown/ICU TEAM  Kristine Holden  EXN:[id number] DOB: [birth year] DOA: 11/12/2017 PCP: Blanchie Serve, MD    Brief Narrative:  84 y.o.femalew/ a hx of atrial fibrillation on Eliquis, hypothyroidism, dementia, osteoarthritis, and AV block with a pacemaker in place who was sent to the hospital for evaluation of right-sided weakness and slurred speech. CT of the head showed a 26 x 14 x 26 mm ring-enhancing mass in the posterior left frontal lobe with vasogenic edema concerning for abscess versus malignancy versus hematoma.Patient was seen by Neurology in the ER. There was concern that she suffered a partial seizure in the ER due to twitching of her face and her arm.   Subjective: The pt is awake but is not able to communicate due expressive aphasia. Some "automatic speech" is intact, but complex direct answers are lacking. She does not appear to be in pain, nor is she in resp distress. She does not follow simple commands.    Assessment & Plan:  Acute slurred speech and right-sided weakness - Newly diagnosed posterior L frontal brain lesion w/ mass effect/edema  Unclear if this is a hemorrhage, abscess, or metastatic lesion - clincally infection is less likely - her pacer is not MRI compatible - begin eval w/ CT chest/abdom/pelvis to search for a primary CA - if this is unrevealing may have to consider de-activating pacer to allow for MRI to better define the lesion - off anticoag   Facial twitching concerns for seizures - Keppra per Neuro   Marginal UA - possible UTI Has empirically been placed on rocephin - follow cx data   Chronic Atrial fibrillation rate controlled - Eliquis is on hold   AV block with Sinus Bradycardia s/p pacer   Dementia Resume home tx regimen   Hypothyroidism TSH WNL - cont synthroid   DVT prophylaxis: SCDs Code Status: DNR - NO CODE  Family Communication: spoke w/ niece at bedside - pt has no surviving spouse or children    Disposition Plan: SDU  Consultants:  Neurology   Antimicrobials:  none  Objective: Blood pressure 133/63, pulse 62, temperature 98.9 F (37.2 C), temperature source Axillary, resp. rate 18, weight 80.2 kg, SpO2 97 %.  Intake/Output Summary (Last 24 hours) at 11/14/2017 1023 Last data filed at 11/14/2017 0454 Gross per 24 hour  Intake 1702.77 ml  Output 1100 ml  Net 602.77 ml   Filed Weights   11/14/17 0500  Weight: 80.2 kg    Examination: General: No acute respiratory distress Lungs: Clear to auscultation bilaterally without wheezes or crackles Cardiovascular: Regular rate without murmur gallop or rub normal S1 and S2 Abdomen: Nontender, nondistended, soft, bowel sounds positive, no rebound, no ascites, no appreciable mass Extremities: No significant cyanosis, clubbing, or edema bilateral lower extremities  CBC: Recent Labs  Lab 11/12/17 1131 11/12/17 1136 11/13/17 0439  WBC 9.2  --  10.5  NEUTROABS 6.7  --   --   HGB 14.0 14.6 14.1  HCT 42.5 43.0 43.3  MCV 95.9  --  95.8  PLT 193  --  496   Basic Metabolic Panel: Recent Labs  Lab 11/12/17 1131 11/12/17 1136 11/12/17 1718 11/13/17 0439  NA 139 139  --  135  K 4.2 4.2  --  4.5  CL 109 107  --  106  CO2 23  --   --  22  GLUCOSE 108* 104*  --  134*  BUN 10 11  --  8  CREATININE 0.73 0.70  --  0.76  CALCIUM 10.5*  --   --  10.1  MG  --   --  2.0  --    GFR: Estimated Creatinine Clearance: 52 mL/min (by C-G formula based on SCr of 0.76 mg/dL).  Liver Function Tests: Recent Labs  Lab 11/12/17 1131 11/13/17 0439  AST 24 38  ALT 26 26  ALKPHOS 68 73  BILITOT 0.9 1.7*  PROT 6.6 6.6  ALBUMIN 3.4* 3.4*   Coagulation Profile: Recent Labs  Lab 11/12/17 1131 11/13/17 0439  INR 1.13 1.07    CBG: Recent Labs  Lab 11/12/17 1705 11/13/17 0438 11/13/17 1226 11/13/17 2358 11/14/17 0638  GLUCAP 86 129* 117* 149* 142*    Scheduled Meds: . insulin aspart  0-9 Units Subcutaneous TID WC  .  levothyroxine  50 mcg Intravenous Daily   Continuous Infusions: . cefTRIAXone (ROCEPHIN)  IV Stopped (11/13/17 1717)  . dextrose 5 % and 0.45% NaCl 75 mL/hr at 11/14/17 0612  . levETIRAcetam Stopped (11/13/17 2239)     LOS: 2 days   Cherene Altes, MD [facility] Office  [phone] Pager - Text Page per Amion  If 7PM-7AM, please contact night-coverage per Amion 11/14/2017, 10:23 AM

## 2017-11-14 NOTE — Progress Notes (Signed)
Sent urine for C/S at 0240, no complications during collection

## 2017-11-15 DIAGNOSIS — G939 Disorder of brain, unspecified: Secondary | ICD-10-CM

## 2017-11-15 DIAGNOSIS — Z880 Allergy status to penicillin: Secondary | ICD-10-CM

## 2017-11-15 DIAGNOSIS — Z950 Presence of cardiac pacemaker: Secondary | ICD-10-CM

## 2017-11-15 DIAGNOSIS — Z8249 Family history of ischemic heart disease and other diseases of the circulatory system: Secondary | ICD-10-CM

## 2017-11-15 DIAGNOSIS — I480 Paroxysmal atrial fibrillation: Secondary | ICD-10-CM

## 2017-11-15 DIAGNOSIS — R011 Cardiac murmur, unspecified: Secondary | ICD-10-CM

## 2017-11-15 DIAGNOSIS — I440 Atrioventricular block, first degree: Secondary | ICD-10-CM

## 2017-11-15 DIAGNOSIS — R531 Weakness: Secondary | ICD-10-CM

## 2017-11-15 DIAGNOSIS — M1710 Unilateral primary osteoarthritis, unspecified knee: Secondary | ICD-10-CM

## 2017-11-15 DIAGNOSIS — Z881 Allergy status to other antibiotic agents status: Secondary | ICD-10-CM

## 2017-11-15 DIAGNOSIS — E039 Hypothyroidism, unspecified: Secondary | ICD-10-CM

## 2017-11-15 DIAGNOSIS — G969 Disorder of central nervous system, unspecified: Secondary | ICD-10-CM

## 2017-11-15 DIAGNOSIS — R569 Unspecified convulsions: Secondary | ICD-10-CM

## 2017-11-15 DIAGNOSIS — Z7901 Long term (current) use of anticoagulants: Secondary | ICD-10-CM

## 2017-11-15 DIAGNOSIS — R4781 Slurred speech: Secondary | ICD-10-CM

## 2017-11-15 DIAGNOSIS — F0390 Unspecified dementia without behavioral disturbance: Secondary | ICD-10-CM

## 2017-11-15 DIAGNOSIS — R4182 Altered mental status, unspecified: Secondary | ICD-10-CM

## 2017-11-15 DIAGNOSIS — Z888 Allergy status to other drugs, medicaments and biological substances status: Secondary | ICD-10-CM

## 2017-11-15 LAB — BASIC METABOLIC PANEL
ANION GAP: 5 (ref 5–15)
BUN: 6 mg/dL — AB (ref 8–23)
CALCIUM: 9.8 mg/dL (ref 8.9–10.3)
CO2: 23 mmol/L (ref 22–32)
CREATININE: 0.72 mg/dL (ref 0.44–1.00)
Chloride: 104 mmol/L (ref 98–111)
GFR calc Af Amer: >60 mL/min (ref >60)
GFR calc non Af Amer: >60 mL/min (ref >60)
GLUCOSE: 157 mg/dL — AB (ref 70–99)
Potassium: 3.5 mmol/L (ref 3.5–5.1)
Sodium: 132 mmol/L — ABNORMAL LOW (ref 135–145)

## 2017-11-15 LAB — GLUCOSE, CAPILLARY
GLUCOSE-CAPILLARY: 141 mg/dL — AB (ref 70–99)
GLUCOSE-CAPILLARY: 149 mg/dL — AB (ref 70–99)
Glucose-Capillary: 142 mg/dL — ABNORMAL HIGH (ref 70–99)
Glucose-Capillary: 162 mg/dL — ABNORMAL HIGH (ref 70–99)

## 2017-11-15 LAB — CBC
HCT: 39.7 % (ref 36.0–46.0)
HEMOGLOBIN: 13.1 g/dL (ref 12.0–15.0)
MCH: 31.1 pg (ref 26.0–34.0)
MCHC: 33 g/dL (ref 30.0–36.0)
MCV: 94.3 fL (ref 78.0–100.0)
PLATELETS: 148 10*3/uL — AB (ref 150–400)
RBC: 4.21 MIL/uL (ref 3.87–5.11)
RDW: 13.4 % (ref 11.5–15.5)
WBC: 9.6 10*3/uL (ref 4.0–10.5)

## 2017-11-15 MED ORDER — LEVETIRACETAM 500 MG PO TABS
500.0000 mg | ORAL_TABLET | Freq: Two times a day (BID) | ORAL | Status: DC
  Administered 2017-11-15 – 2017-11-18 (×6): 500 mg via ORAL
  Filled 2017-11-15 (×6): qty 1

## 2017-11-15 MED ORDER — ENSURE ENLIVE PO LIQD
237.0000 mL | Freq: Three times a day (TID) | ORAL | Status: DC
  Administered 2017-11-15 – 2017-11-19 (×12): 237 mL via ORAL

## 2017-11-15 NOTE — Consult Note (Signed)
[facility]    Date of Admission:  11/12/2017   Total days of antibiotics 2        Day 2 Ceftriaxone 1gm QD               Reason for Consult: Brain mass     Referring Provider: Thereasa Solo  Primary Care Provider: Blanchie Serve, MD   Assessment: Kristine Holden is a 84 y.o. F admitted with weakness, slurred speech and seizure. Work up found to have mass within her L parietal lobe that is ring-enhancing on CT scan. Less likely this is a primary malignancy and no evidence on CT scans to suggest this to be a metastatic process from alternative primary cancer. Her anticoagulation is being held. Would ask neurosurgery for opinion regarding aspiration of abscess to guide treatment decisions. Will stop ceftriaxone in anticipation of this to not interfere with biopsy and recovery of organism should this prove to be infectious abscess. No family in the room to facilitate history. Will send fungal antibody panel with uncertainty about her history.   Plan: 1. Stop ceftriaxone  2. Serum fungal antibody panel  3. Neurosurgery evaluation to consider aspiration of brain mass contents   Principal Problem:   Seizure ([place]) Active Problems:   OA (osteoarthritis) of knee   First degree AV block   Sinus bradycardia   SAH (subarachnoid hemorrhage) (HCC)   Hypothyroidism   Paroxysmal A-fib ([place])   . chlorhexidine  15 mL Mouth Rinse BID  . donepezil  10 mg Oral Q1200  . levETIRAcetam  500 mg Oral BID  . levothyroxine  75 mcg Oral QAC breakfast  . mouth rinse  15 mL Mouth Rinse q12n4p  . memantine  21 mg Oral Daily  . polyethylene glycol  17 g Oral Daily    HPI: Kristine Holden is a 84 y.o. female with pmhx significant for AFib (on eliquis), hypothyroidism, dementia, OA, AV block with PPM in place. She arrived to the hospital from SNF on 9/27 with slurred speech and weakness. Head CT was obtained with code stroke and revealed 23 x 14 x 53mm solitary ring-enhancing mass  in the posterior left frontal lobe with vasogenic edema. Possible abscess, malignancy, subacute hematoma on differential. CT c/a/p obtained and no sources to suggest primary malignancy in chest, abdomen or pelvis. In the ER she was seen by neurology team - concern for seizure (twitching her arm/face); she is still unable to move her R arm or leg much at all. She is aphasic. With concern for hematoma she is off anticoagulation in the setting of uncharacterized brain mass (PPM not MRI compatible). Blood cultures were drawn on 9/28 (no growth). She has had one recorded temp of 100.4 F in the hospital with normal WBC count. She received ceftriaxone 1g QD x 2d. ECG 1st degree AV block. TTE 55 - 76%, grade 2 diastolic dysfunction; negative otherwise and specifically no signs of bacterial endocarditis.   Neurology team requested ID input for empiric treatment of potential brain abscess.  Review of Systems: Review of Systems  Unable to perform ROS: Dementia    Past Medical History:  Diagnosis Date  . Arthritis   . First degree AV block   . Hypothyroidism   . Hypothyroidism    left thyroid  . Lichen sclerosus   . OA (osteoarthritis)    hands  . Osteoporosis   . Paroxysmal atrial fibrillation (HCC)   . Sick sinus syndrome (  [place])   . Vitamin D deficiency      Social History   Tobacco Use  . Smoking status: Never Smoker  . Smokeless tobacco: Never Used  Substance Use Topics  . Alcohol use: No  . Drug use: No    Family History  Problem Relation Age of Onset  . Heart attack Father   . Heart disease Father    Allergies  Allergen Reactions  . Augmentin [Amoxicillin-Pot Clavulanate] Nausea And Vomiting  . Quinine Derivatives Other (See Comments)    Pt states "im just allergic" unknown reaction  . Penicillins Rash    OBJECTIVE: Blood pressure (!) 167/67, pulse 65, temperature 98.6 F (37 C), temperature source Axillary, resp. rate 19, weight 81.2 kg, SpO2 97 %.  Physical Exam    Constitutional: She appears well-developed and well-nourished.  Resting in bed.   HENT:  Mouth/Throat: Mucous membranes are normal. No oral lesions. Normal dentition. No dental abscesses. No oropharyngeal exudate.  Eyes: Pupils are equal, round, and reactive to light. Conjunctivae are normal.  Cardiovascular: Normal rate and regular rhythm.  Murmur (soft ) heard. Pulmonary/Chest: Effort normal and breath sounds normal.  Abdominal: Soft. She exhibits no distension. There is no tenderness.  Musculoskeletal: She exhibits no edema.  Lymphadenopathy:    She has no cervical adenopathy.  Neurological: She is alert.  Grunts with answering occasionally. Moves RLE<LLE. Cannot stick tongue out when asked.   Skin: Skin is warm and dry. No rash noted.  Psychiatric: She has a normal mood and affect. Judgment normal.  Vitals reviewed.  Lab Results Lab Results  Component Value Date   WBC 9.6 11/15/2017   HGB 13.1 11/15/2017   HCT 39.7 11/15/2017   MCV 94.3 11/15/2017   PLT 148 (L) 11/15/2017    Lab Results  Component Value Date   CREATININE 0.72 11/15/2017   BUN 6 (L) 11/15/2017   NA 132 (L) 11/15/2017   K 3.5 11/15/2017   CL 104 11/15/2017   CO2 23 11/15/2017    Lab Results  Component Value Date   ALT 26 11/13/2017   AST 38 11/13/2017   ALKPHOS 73 11/13/2017   BILITOT 1.7 (H) 11/13/2017     Microbiology: Recent Results (from the past 240 hour(s))  Culture, blood (Routine X 2) w Reflex to ID Panel     Status: None (Preliminary result)   Collection Time: 11/13/17  9:48 AM  Result Value Ref Range Status   Specimen Description BLOOD LEFT ANTECUBITAL  Final   Special Requests   Final    BOTTLES DRAWN AEROBIC ONLY Blood Culture adequate volume   Culture   Final    NO GROWTH 1 DAY Performed at [facility], [place] [address], [place], [place] [address]    Report Status PENDING  Incomplete  Culture, blood (Routine X 2) w Reflex to ID Panel     Status: None (Preliminary result)    Collection Time: 11/13/17  9:53 AM  Result Value Ref Range Status   Specimen Description BLOOD FINGER LEFT  Final   Special Requests   Final    BOTTLES DRAWN AEROBIC ONLY Blood Culture adequate volume   Culture   Final    NO GROWTH 1 DAY Performed at [facility], [place] [address], [place],  [address]    Report Status PENDING  Incomplete    Janene Madeira, MSN, NP-C [facility] [place] Cell: [phone] Pager: [phone]  11/15/2017 12:29 PM

## 2017-11-15 NOTE — Care Management Note (Signed)
Case Management Note  Patient Details  Name: Kristine Holden MRN: [id number] Date of Birth: 03/13/1927  Subjective/Objective:    Pt in with seizures. She is from [place] care.  PCP:   Dr Bubba Camp              Action/Plan: Awaiting decision on brain mass. Currently the plan is for patient to return to [facility] memory care when medically ready. CM following.  Expected Discharge Date:                  Expected Discharge Plan:     In-House Referral:     Discharge planning Services     Post Acute Care Choice:    Choice offered to:     DME Arranged:    DME Agency:     HH Arranged:    HH Agency:     Status of Service:  In process, will continue to follow  If discussed at Long Length of Stay Meetings, dates discussed:    Additional Comments:  Pollie Friar, RN 11/15/2017, 12:15 PM

## 2017-11-15 NOTE — Evaluation (Signed)
Physical Therapy Evaluation Patient Details Name: Kristine Holden MRN: [id number] DOB: [birth year] Today's Date: 11/15/2017   History of Present Illness  84 y.o. 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 CT of the head showed a 26 x 14 x 26 mm ring-enhancing mass in the posterior left frontal lobe with vasogenic edema concerning for abscess versus malignancy versus hematoma. Seizure in ED.   Clinical Impression  Pt admitted with above diagnosis. Pt currently with functional limitations due to the deficits listed below (see PT Problem List). Total assist for supine to sit, mod assist for sitting balance. Pt did not respond verbally to questions, did not follow commands. SNF recommended.  Pt will benefit from skilled PT to increase their independence and safety with mobility to allow discharge to the venue listed below.       Follow Up Recommendations SNF;Supervision/Assistance - 24 hour    Equipment Recommendations  None recommended by PT    Recommendations for Other Services       Precautions / Restrictions Precautions Precautions: Fall Restrictions Weight Bearing Restrictions: No      Mobility  Bed Mobility Overal bed mobility: Needs Assistance Bed Mobility: Supine to Sit;Sit to Supine     Supine to sit: Total assist Sit to supine: Total assist   General bed mobility comments: assist to advance BLEs and raise trunk (pt 0%)  Transfers                 General transfer comment: unable  Ambulation/Gait                Stairs            Wheelchair Mobility    Modified Rankin (Stroke Patients Only)       Balance Overall balance assessment: Needs assistance Sitting-balance support: Feet supported;Single extremity supported Sitting balance-Leahy Scale: Poor Sitting balance -  Comments: mod A to maintain static sitting balance 2* R lateral lean Postural control: Right lateral lean                                   Pertinent Vitals/Pain Pain Assessment: Faces Faces Pain Scale: No hurt    Home Living Family/patient expects to be discharged to:: Skilled nursing facility                      Prior Function           Comments: pt unable to provide PLOF, no family present     Hand Dominance   Dominant Hand: Right    Extremity/Trunk Assessment   Upper Extremity Assessment Upper Extremity Assessment: Defer to OT evaluation;Difficult to assess due to impaired cognition RUE Deficits / Details: no active movement of RUE observed, RUE flaccid    Lower Extremity Assessment Lower Extremity Assessment: RLE deficits/detail;Difficult to assess due to impaired cognition(pt unable to follow commands so difficult to assess LLE) RLE Deficits / Details: no active movement of RLE observed       Communication   Communication: Expressive difficulties;Receptive difficulties(pt did not verbalize during PT session, did not follow commands)  Cognition Arousal/Alertness: Lethargic Behavior During Therapy: Flat affect Overall Cognitive Status: No family/caregiver present to determine baseline cognitive functioning  General Comments      Exercises     Assessment/Plan    PT Assessment Patient needs continued PT services  PT Problem List Decreased strength;Decreased balance;Decreased activity tolerance;Decreased mobility;Decreased cognition;Impaired tone;Decreased safety awareness;Decreased coordination       PT Treatment Interventions Functional mobility training;Therapeutic activities;Therapeutic exercise;Balance training;Neuromuscular re-education;Cognitive remediation;Patient/family education    PT Goals (Current goals can be found in the Care Plan section)  Acute Rehab PT  Goals PT Goal Formulation: Patient unable to participate in goal setting Time For Goal Achievement: 11/29/17 Potential to Achieve Goals: Fair    Frequency Min 2X/week   Barriers to discharge        Co-evaluation               AM-PAC PT "6 Clicks" Daily Activity  Outcome Measure Difficulty turning over in bed (including adjusting bedclothes, sheets and blankets)?: Unable Difficulty moving from lying on back to sitting on the side of the bed? : Unable Difficulty sitting down on and standing up from a chair with arms (e.g., wheelchair, bedside commode, etc,.)?: Unable Help needed moving to and from a bed to chair (including a wheelchair)?: Total Help needed walking in hospital room?: Total Help needed climbing 3-5 steps with a railing? : Total 6 Click Score: 6    End of Session   Activity Tolerance: Patient tolerated treatment well Patient left: in bed;with call bell/phone within reach;with bed alarm set Nurse Communication: Mobility status;Need for lift equipment PT Visit Diagnosis: Muscle weakness (generalized) (M62.81);Difficulty in walking, not elsewhere classified (R26.2);Hemiplegia and hemiparesis Hemiplegia - Right/Left: Right    Time: 4098-1191 PT Time Calculation (min) (ACUTE ONLY): 8 min   Charges:   PT Evaluation $PT Eval Moderate Complexity: 1 Mod         Philomena Doheny PT 11/15/2017  Acute Rehabilitation Services Pager [phone] Office [phone]

## 2017-11-15 NOTE — Progress Notes (Signed)
Subjective: Patient is awake, nonverbal, does track me in the room.  Does not follow any commands, is not agitated at this time.  No facial twitching noted  Exam: Vitals:   11/15/17 0258 11/15/17 0734  BP: (!) 145/62 (!) 156/60  Pulse:    Resp:    Temp: (!) 100.4 F (38 C) 98.7 F (37.1 C)  SpO2:      Physical Exam   HEENT-  Normocephalic, no lesions, without obvious abnormality.  Normal external eye and conjunctiva.   Extremities- Warm, dry and intact Musculoskeletal-no joint tenderness, deformity or swelling Skin-warm and dry, no hyperpigmentation, vitiligo, or suspicious lesions    Neuro:  Mental Status: Patient is awake, she does track my movements in the room, she does not follow any commands, she is mute.  I did not notice any verbal output today. Cranial Nerves: II: Blinks to threat bilaterally III,IV, VI: As stated patient is able to follow me in the room left to right however I need to show her facial recognition as she will not follow my fingers.  Pupils reactive. V,VII: Mild right facial droop,  VIII: No significant response to verbal stimuli  Motor: No significant motor movement other than to noxious stimuli Sensory: Withdraws from noxious stimuli Deep Tendon Reflexes: 2+ upper extremities 1+ at knee jerk no ankle jerk      Medications:  Prior to Admission:  Medications Prior to Admission  Medication Sig Dispense Refill Last Dose  . acetaminophen (TYLENOL) 500 MG tablet Take 500 mg by mouth every 6 (six) hours as needed (PAIN).    06/10/17 at prn  . apixaban (ELIQUIS) 5 MG TABS tablet Take 1 tablet (5 mg total) by mouth 2 (two) times daily. 60 tablet 6 11/11/2017 at 1900  . Apoaequorin (PREVAGEN) 10 MG CAPS Take 1 capsule by mouth daily.    11/11/2017 at Unknown time  . Homeopathic Products (MUSCLE THERAPY/ARNICA) GEL Apply 1 application topically at bedtime.   11/11/2017 at Unknown time  . levothyroxine (SYNTHROID, LEVOTHROID) 75 MCG tablet Take 75 mcg by  mouth daily before breakfast.   11/12/2017 at Unknown time  . Lutein-Zeaxanthin 20-1 MG CAPS Take 1 tablet by mouth daily.   11/11/2017 at Unknown time  . Magnesium 500 MG CAPS Take 1 capsule by mouth daily.   11/11/2017 at Unknown time  . Memantine HCl-Donepezil HCl (NAMZARIC) 21-10 MG CP24 Take 1 tablet by mouth daily.   11/11/2017 at Unknown time  . Polyethyl Glycol-Propyl Glycol (SYSTANE) 0.4-0.3 % SOLN Apply 1 drop to eye 2 (two) times daily. For both eyes.   11/11/2017 at Unknown time  . polyethylene glycol (MIRALAX / GLYCOLAX) packet Take 17 g by mouth daily.   11/11/2017 at Unknown time  . triamcinolone cream (KENALOG) 0.1 % Apply 1 application topically 2 (two) times daily as needed. Apply to hands and legs   09/17/2017 at prn   Scheduled: . chlorhexidine  15 mL Mouth Rinse BID  . donepezil  10 mg Oral Q1200  . insulin aspart  0-9 Units Subcutaneous TID WC  . levothyroxine  75 mcg Oral QAC breakfast  . mouth rinse  15 mL Mouth Rinse q12n4p  . memantine  21 mg Oral Daily  . polyethylene glycol  17 g Oral Daily   Continuous: . cefTRIAXone (ROCEPHIN)  IV 200 mL/hr at 11/14/17 1700  . dextrose 5 % and 0.45% NaCl Stopped (11/14/17 1650)  . levETIRAcetam 500 mg (11/14/17 2311)    Pertinent Labs/Diagnostics:   Ct Chest W  Contrast  Result Date: 11/14/2017 CLINICAL DATA:  Brain mass, evaluate for primary malignancy EXAM: CT CHEST, ABDOMEN, AND PELVIS WITH  IMPRESSION: No findings suspicious for primary malignancy in the chest, abdomen, or pelvis. Moderate to severe hepatic steatosis. Gallbladder sludge versus noncalcified gallstones, without associated inflammatory changes. Status post appendectomy and hysterectomy. Electronically Signed   By: Julian Hy M.D.   On: 11/14/2017 22:46   Ct Abdomen Pelvis W Contrast  Result Date: 11/14/2017 . IMPRESSION: No findings suspicious for primary malignancy in the chest, abdomen, or pelvis. Moderate to severe hepatic steatosis. Gallbladder sludge  versus noncalcified gallstones, without associated inflammatory changes. Status post appendectomy and hysterectomy. Electronically Signed   By: Julian Hy M.D.   On: 11/14/2017 22:46   CT head with contrast: IMPRESSION: 23 x 14 x 26 mm solitary ring-enhancing mass in the posterior left frontal lobe with vasogenic edema. Abscess, malignancy, or subacute hematoma could have this appearance.  Etta Quill PA-C [facility] [phone]   Assessment: 84 year old female with newly diagnosed irregular ring-enhancing left posterior frontal lobe lesion with surrounding vasogenic edema, along with new onset seizure.  Unfortunately patient cannot have MRI.  As noted prior Eliquis has been discontinued due to possible ICH.   Recommendations: -Continue Keppra 500 mg IV twice daily - ID input would be helpful     11/15/2017, 10:24 AM

## 2017-11-15 NOTE — Evaluation (Signed)
Speech Language Pathology Evaluation Patient Details Name: Kristine Holden MRN: [id number] DOB: May 23, 1927 Today's Date: 11/15/2017 Time: 1856-3149 SLP Time Calculation (min) (ACUTE ONLY): 30 min  Problem List:  Patient Active Problem List   Diagnosis Date Noted  . Right sided weakness 11/12/2017  . Seizure ([place]) 11/12/2017  . Subconjunctival hematoma, right 10/13/2017  . Hyperlipidemia 01/19/2017  . Liver function test abnormality 01/19/2017  . Weight loss 01/18/2017  . Allergic rhinitis 01/15/2017  . Hypercalcemia 12/11/2016  . Osteoporosis without current pathological fracture 11/12/2016  . Atypical mole 10/01/2016  . Chronic anticoagulation 09/16/2016  . Chronic constipation 09/16/2016  . Late onset Alzheimer's disease without behavioral disturbance 07/21/2016  . Paroxysmal A-fib ([place]) 07/21/2016  . 1st degree AV block 08/12/2014  . Hypothyroidism 08/11/2014  . Stenosed aortic valve 08/11/2014  . Lichen sclerosus 70/26/3785  . Tachycardia with sick sinus syndrome, CHad2Vasc2=3, HASBLED=1 08/11/2014  . History of fracture of skull   . Atrial fibrillation with RVR ([place]) 08/10/2014  . SAH (subarachnoid hemorrhage) ([place]) 08/10/2014  . Syncope and collapse 08/10/2014  . S/P cataract surgery, right eye 08/10/2014  . First degree AV block 01/04/2014  . Primary osteoarthritis of left knee 01/04/2014  . Sinus bradycardia 01/04/2014  . OA (osteoarthritis) of knee 01/25/2012   Past Medical History:  Past Medical History:  Diagnosis Date  . Arthritis   . First degree AV block   . Hypothyroidism   . Hypothyroidism    left thyroid  . Lichen sclerosus   . OA (osteoarthritis)    hands  . Osteoporosis   . Paroxysmal atrial fibrillation ([facility])   . Sick sinus syndrome ([place])   . Vitamin D deficiency    Past Surgical History:  Past Surgical History:  Procedure Laterality Date  . ABDOMINAL HYSTERECTOMY  1974  . APPENDECTOMY  1951  . EP IMPLANTABLE DEVICE N/A 08/13/2014   Procedure: Pacemaker Implant;  Surgeon: Deboraha Sprang, MD;  Location: [facility];  Service: Cardiovascular;  Laterality: N/A;  . HAND SURGERY  20years ago   open dislocation  . TONSILLECTOMY  1942   x2  . TOTAL KNEE ARTHROPLASTY  01/25/2012   Procedure: TOTAL KNEE ARTHROPLASTY;  Surgeon: Gearlean Alf, MD;  Location: [facility];  Service: Orthopedics;  Laterality: Right;   HPI:  Kristine Holden is a 84 y.o. female with medical history significant of with past medical history of atrial fibrillation on Eliquis, hypothyroidism, dementia, osteoarthritis, AV block with pacemaker in place was sent to the hospital for evaluation of right-sided weakness and slurred speech. Patient had a CT of the head done in the ER which showed ring-enhancing mass in the posterior left frontal lobe with vasogenic edema concerning for abscess versus malignancy versus hematoma this is 26 x 14 x 26 mm.  Patient was seen by neurology in the ER.  There was concerns that she may have had a partial seizure in the ER due to twitching of her face and her arm.  Loading dose of IV Keppra was started and was given 1 time dose of 2 mg of IV Ativan.     Assessment / Plan / Recommendation Clinical Impression  Patient presents with expressive more than receptive aphasia, dysarthria and suspected oral dysphagia.  She is able to answering yes/no for immediate needs appropriately approx 75% of the time via head nods in current environment.  Automatic speech tasks - most notably singing "happy birthday" effectively completed by pt with 85% accuracy over all.  Counting was  more difficult for pt with her only stating "3" on last attempt for counting 1-3.  Repetition of automatic speech tasks improves her articulation.  Pt able to identify her name from choice of two *when name on left side - but not on right - Suspect right inattention/visual deficits possible.  Pt will benefit from aggressive SLP to help maximize her        SLP Assessment   SLP Recommendation/Assessment: Patient needs continued Speech Lanaguage Pathology Services SLP Visit Diagnosis: Dysphagia, oral phase (R13.11);Dysarthria and anarthria (R47.1);Aphasia (R47.01)    Follow Up Recommendations  Skilled Nursing facility    Frequency and Duration min 2x/week  2 weeks      SLP Evaluation Cognition  Overall Cognitive Status: No family/caregiver present to determine baseline cognitive functioning Arousal/Alertness: Awake/alert Orientation Level: Disoriented to place;Disoriented to time;Disoriented to situation;Oriented to person Attention: Sustained Sustained Attention: Impaired Sustained Attention Impairment: Verbal complex(complext tasks difficult for pt to focus) Memory: (difficult to assess, but relocated call bell at end of session) Awareness: (intact for basic language deficits) Problem Solving: (able to use napkin to clean face independently, further assessment needed) Safety/Judgment: Impaired(pt with mitt for safety and bed alarm in place, decreased safety awareness)       Comprehension  Auditory Comprehension Overall Auditory Comprehension: Impaired Yes/No Questions: Impaired Basic Biographical Questions: 76-100% accurate(2/2) Basic Immediate Environment Questions: 0-24% accurate(0/2) Commands: Impaired One Step Basic Commands: 25-49% accurate Interfering Components: Processing speed;Motor planning EffectiveTechniques: Repetition;Visual/Gestural cues Visual Recognition/Discrimination Discrimination: Exceptions to [facility] L/R Discrimination: Not tested Common Objects: Unable to indentify Black/White Line Drawings: Unable to identify Reading Comprehension Reading Status: Impaired(pt able to identify her first name (large print, left side), ? some right inattention? as she did not identify last name from choice of two (correct name on right side)) Word level: Impaired Sentence Level: Not tested Paragraph Level: Not tested Functional Environmental  (signs, name badge): Not tested Interfering Components: Right neglect/inattention(suspect right inattention) Effective Techniques: Large print;Tactile cueing    Expression Expression Primary Mode of Expression: Verbal Verbal Expression Overall Verbal Expression: Impaired Initiation: Impaired Automatic Speech: Singing;Counting(1-10- last attempt pt stated "3", happy birthday with 85% accuracy overall) Level of Generative/Spontaneous Verbalization: Word(attempts) Repetition: Impaired Level of Impairment: Word level Naming: Impairment Responsive: 0-25% accurate Confrontation: Not tested Common Objects: Unable to indentify [employer] Drawings: Unable to identify Other Naming Comments: naming items 0% even with repetition Verbal Errors: Not aware of errors Pragmatics: No impairment(pt uses social cues very appropriately) Interfering Components: Speech intelligibility Non-Verbal Means of Communication: (head nodding "yes, no" appropriately for immediate needs in current environment) Written Expression Dominant Hand: Right Written Expression: Not tested   Oral / Motor  Oral Motor/Sensory Function Overall Oral Motor/Sensory Function: Within functional limits(tongue midline, no facial droop or noted weakness) Motor Speech Overall Motor Speech: Impaired Respiration: Impaired Phonation: Low vocal intensity Articulation: Impaired Level of Impairment: Word(sound level) Intelligibility: Intelligibility reduced Word: 0-24% accurate Phrase: Not tested Sentence: Not tested Conversation: Not tested Motor Planning: Not tested(suspect largely intact, no groping noted)   GO                    Macario Golds 11/15/2017, 10:15 AM   Luanna Salk, MS [place] Pager [phone] Office [phone]

## 2017-11-15 NOTE — Progress Notes (Signed)
Initial Nutrition Assessment  DOCUMENTATION CODES:   Not applicable  INTERVENTION:  Provide Ensure Enlive po TID, each supplement provides 350 kcal and 20 grams of protein.  Encourage adequate PO intake.   NUTRITION DIAGNOSIS:    Inadequate oral intake related to dysphagia as evidenced by meal completion < 25%.  GOAL:   Patient will meet greater than or equal to 90% of their needs  MONITOR:   PO intake, Supplement acceptance, Labs, Weight trends, I & O's, Skin, Diet advancement  REASON FOR ASSESSMENT:   Low Braden    ASSESSMENT:   84 y.o. 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 CT of the head showed a 26 x 14 x 26 mm ring-enhancing mass in the posterior left frontal lobe with vasogenic edema concerning for abscess versus malignancy versus hematoma.  Patient was seen by Neurology in the ER. There was concern that she suffered a partial seizure in the ER due to twitching of her face and her arm.    Nurse tech feeding pt lunch during time of visit. Pt only consumed a couple of bites of food from meal tray. RD unable to obtain pt nutrition history. SLP evaluation today revealed pt with significantly delayed swallow/mastication thus may affect adequate nutrition intake. SLP recommends maximizing intake with the aid of nutritional supplements. RD to order Ensure to aid in caloric and protein needs.   Unable to complete Nutrition-Focused physical exam at this time.   Labs and medications reviewed.   Diet Order:   Diet Order            DIET DYS 3 Room service appropriate? Yes; Fluid consistency: Thin  Diet effective now              EDUCATION NEEDS:   Not appropriate for education at this time  Skin:  Skin Assessment: Reviewed RN Assessment  Last BM:  Unknown  Height:   Ht Readings from Last  1 Encounters:  11/12/17 5\' 6"  (1.676 m)    Weight:   Wt Readings from Last 1 Encounters:  11/15/17 81.2 kg    Ideal Body Weight:  59 kg  BMI:  Body mass index is 28.89 kg/m.  Estimated Nutritional Needs:   Kcal:  1750-1900  Protein:  80-90 grams  Fluid:  1.7 -1.9 L/day    Corrin Parker, MS, RD, LDN Pager # [phone] After hours/ weekend pager # [phone]

## 2017-11-15 NOTE — Progress Notes (Signed)
  Speech Language Pathology Treatment: Dysphagia  Patient Details Name: Kristine Holden MRN: [id number] DOB: 12/22/1927 Today's Date: 11/15/2017 Time: 2426-8341 SLP Time Calculation (min) (ACUTE ONLY): 25 min  Assessment / Plan / Recommendation Clinical Impression  Pt today alert and willing to accept intake of breakfast including muffin, french toast, milk, water and po medication given by RN.  Oral retention of pill noted on right superior tongue without awareness, use of several boluses of applesauce facilitated clearance.  SLP "loaded" spoon/fork for pt to self feed allowing automaticity to aid swallowing.  Liquids provided via straw on her left side.  No indication of airway compromise with po noted but pt is SIGNIFICANTLY delayed with her swallow/mastication,etc and adequate nutrition may be challenging for her to consume.    Recommend dys3/ground meats/thin diet continue with strict aspiration precautions including checking for oral residuals on right.  Maximizing liquid nutrition may be helpful to maximize intake.    Also ? If pt may be some right sided inattention?/visual deficits?    Pt will benefit from follow up for SLP cognitive linguistic and dysphagia treatment.  Thanks for this consult.      HPI HPI: Kristine Holden is a 84 y.o. female with medical history significant of with past medical history of atrial fibrillation on Eliquis, hypothyroidism, dementia, osteoarthritis, AV block with pacemaker in place was sent to the hospital for evaluation of right-sided weakness and slurred speech. Patient had a CT of the head done in the ER which showed ring-enhancing mass in the posterior left frontal lobe with vasogenic edema concerning for abscess versus malignancy versus hematoma this is 26 x 14 x 26 mm.  Patient was seen by neurology in the ER.  There was concerns that she may have had a partial seizure in the ER due to twitching of her face and her arm.  Loading dose of IV Keppra was started  and was given 1 time dose of 2 mg of IV Ativan.        SLP Plan  Continue with current plan of care  Patient needs continued Speech Lanaguage Pathology Services    Recommendations  Diet recommendations: Dysphagia 3 (mechanical soft);Thin liquid Liquids provided via: Cup;Straw Medication Administration: Crushed with puree Supervision: Staff to assist with self feeding Compensations: Minimize environmental distractions;Slow rate;Small sips/bites;Lingual sweep for clearance of pocketing(drink liquids t/o meal, allow time for delayed dry swallows) Postural Changes and/or Swallow Maneuvers: Seated upright 90 degrees;Upright 30-60 min after meal                Oral Care Recommendations: Oral care before and after PO Follow up Recommendations: Skilled Nursing facility SLP Visit Diagnosis: Dysphagia, oral phase (R13.11) Plan: Continue with current plan of care       GO                Macario Golds 11/15/2017, 10:22 AM  Luanna Salk, MS [facility] SLP Acute Rehab Services Pager [phone] Office [phone]

## 2017-11-15 NOTE — Progress Notes (Signed)
[facility] TEAM 1 - Stepdown/ICU TEAM  Kristine Holden  BDZ:[id number] DOB: [birth year] DOA: 11/12/2017 PCP: Blanchie Serve, MD    Brief Narrative:  84 y.o.femalew/ a hx of atrial fibrillation on Eliquis, hypothyroidism, dementia, osteoarthritis, and AV block with a pacemaker in place who was sent to the hospital for evaluation of right-sided weakness and slurred speech. CT of the head showed a 26 x 14 x 26 mm ring-enhancing mass in the posterior left frontal lobe with vasogenic edema concerning for abscess versus malignancy versus hematoma.Patient was seen by Neurology in the ER. There was concern that she suffered a partial seizure in the ER due to twitching of her face and her arm.   Subjective: The pt appears ever so slightly more interactive today. She is still not moving her R arm or leg to an appreciable extent. She is in no apparent resp distress or uncontrolled pain   Assessment & Plan:  Acute slurred speech and right-sided weakness - Newly diagnosed posterior L frontal brain lesion w/ mass effect/edema  Unclear if this is a hemorrhage, abscess, or metastatic lesion - clincally infection seems less likely - her pacer is not MRI compatible - CT chest/abdom/pelvis w/o evidence of a primary CA making metastatic disease less likely - remains off anticoag - had extensive discussion w/ niece/POA at bedside - apart from an infectious etiology that could be tx, it is not likely that further investigation will prove fruitful for the pt in terms of recovery - I will ask ID to weigh in - if they do not strongly feel that infection is likely, I suggest that we not pursue any further eval and transition into a recovery phase - POA understands she will need to escalate to a SNF level of care for ongoing PT/OT/SLP, but that further recovery of deficits is not likely   Facial twitching concerns for seizures - Keppra per Neuro - appears to have ceased    Marginal UA - possible UTI Has empirically been  placed on rocephin - no helpful culture data - will stop after 5 days of tx    Chronic Atrial fibrillation rate controlled - discussed need to cont to hold anticoag as this lesion could be due to bleeding - discussed risk of CVA w/ POA while off anticoag, but at present a higher risk of bleeding w/ use of anticoag - unless pt is found to have a infectious etiology for her brain lesion, she will need to stay off anticoag permanently    AV block with Sinus Bradycardia s/p pacer   Dementia Resumed home tx regimen   Hypothyroidism TSH WNL - cont synthroid   DVT prophylaxis: SCDs Code Status: DNR - NO CODE  Family Communication: spoke w/ niece/POA at bedside - pt has no surviving spouse or children  Disposition Plan: SDU - will need SNF level of care at d/c, w/ timing depending upon ID eval  Consultants:  Neurology   Antimicrobials:  Rocephin 9/28 >  Objective: Blood pressure (!) 156/60, pulse 65, temperature 98.7 F (37.1 C), temperature source Axillary, resp. rate 19, weight 81.2 kg, SpO2 97 %.  Intake/Output Summary (Last 24 hours) at 11/15/2017 1142 Last data filed at 11/15/2017 0500 Gross per 24 hour  Intake 565.98 ml  Output 1600 ml  Net -1034.02 ml   Filed Weights   11/14/17 0500 11/15/17 0500  Weight: 80.2 kg 81.2 kg    Examination: General: No acute respiratory distress - alert  Lungs: Clear to auscultation bilaterally - no  wheezing  Cardiovascular: RRR - paced on monitor - no M  Abdomen: NT/ND, soft, bs+, no mass, no rebound  Extremities: No signif edema bilateral lower extremities  CBC: Recent Labs  Lab 11/12/17 1131 11/12/17 1136 11/13/17 0439 11/15/17 0716  WBC 9.2  --  10.5 9.6  NEUTROABS 6.7  --   --   --   HGB 14.0 14.6 14.1 13.1  HCT 42.5 43.0 43.3 39.7  MCV 95.9  --  95.8 94.3  PLT 193  --  186 828*   Basic Metabolic Panel: Recent Labs  Lab 11/12/17 1131 11/12/17 1136 11/12/17 1718 11/13/17 0439 11/15/17 0716  NA 139 139  --  135  132*  K 4.2 4.2  --  4.5 3.5  CL 109 107  --  106 104  CO2 23  --   --  22 23  GLUCOSE 108* 104*  --  134* 157*  BUN 10 11  --  8 6*  CREATININE 0.73 0.70  --  0.76 0.72  CALCIUM 10.5*  --   --  10.1 9.8  MG  --   --  2.0  --   --    GFR: Estimated Creatinine Clearance: 52.3 mL/min (by C-G formula based on SCr of 0.72 mg/dL).  Liver Function Tests: Recent Labs  Lab 11/12/17 1131 11/13/17 0439  AST 24 38  ALT 26 26  ALKPHOS 68 73  BILITOT 0.9 1.7*  PROT 6.6 6.6  ALBUMIN 3.4* 3.4*   Coagulation Profile: Recent Labs  Lab 11/12/17 1131 11/13/17 0439  INR 1.13 1.07    CBG: Recent Labs  Lab 11/13/17 2358 11/14/17 0638 11/14/17 1117 11/15/17 0043 11/15/17 0631  GLUCAP 149* 142* 135* 141* 149*    Scheduled Meds: . chlorhexidine  15 mL Mouth Rinse BID  . donepezil  10 mg Oral Q1200  . insulin aspart  0-9 Units Subcutaneous TID WC  . levothyroxine  75 mcg Oral QAC breakfast  . mouth rinse  15 mL Mouth Rinse q12n4p  . memantine  21 mg Oral Daily  . polyethylene glycol  17 g Oral Daily   Continuous Infusions: . cefTRIAXone (ROCEPHIN)  IV 200 mL/hr at 11/14/17 1700  . dextrose 5 % and 0.45% NaCl Stopped (11/14/17 1650)  . levETIRAcetam 500 mg (11/14/17 2311)     LOS: 3 days   Cherene Altes, MD [facility] Office  [phone] Pager - Text Page per Amion  If 7PM-7AM, please contact night-coverage per Amion 11/15/2017, 11:42 AM

## 2017-11-16 LAB — GLUCOSE, CAPILLARY: Glucose-Capillary: 114 mg/dL — ABNORMAL HIGH (ref 70–99)

## 2017-11-16 MED ORDER — DEXAMETHASONE SODIUM PHOSPHATE 10 MG/ML IJ SOLN
10.0000 mg | Freq: Once | INTRAMUSCULAR | Status: AC
  Administered 2017-11-16: 10 mg via INTRAVENOUS
  Filled 2017-11-16: qty 1

## 2017-11-16 MED ORDER — DEXAMETHASONE SODIUM PHOSPHATE 4 MG/ML IJ SOLN
2.0000 mg | Freq: Four times a day (QID) | INTRAMUSCULAR | Status: DC
  Administered 2017-11-16 – 2017-11-18 (×7): 2 mg via INTRAVENOUS
  Filled 2017-11-16 (×8): qty 1

## 2017-11-16 NOTE — Progress Notes (Addendum)
Subjective: Patient is awake, moans when noxious stimuli is given, does track me in the room, nonverbal, will lift her left arm when asked but does not lift her right arm when asked.  Exam: Vitals:   11/16/17 0420 11/16/17 0804  BP: (!) 158/60   Pulse:    Resp:  20  Temp: 98.7 F (37.1 C) 98.9 F (37.2 C)  SpO2: 97%     Physical Exam   HEENT-  Normocephalic, no lesions, without obvious abnormality.  Normal external eye and conjunctiva.    Extremities- Warm, dry and intact Musculoskeletal-no joint tenderness, deformity or swelling Skin-warm and dry, no hyperpigmentation, vitiligo, or suspicious lesions    Neuro:  Mental Status: Patient is awake, does track my movements in the room, will follow commands only to lifting her left arm, other than moaning to noxious stimuli she is mute. Cranial Nerves: II: Blinks to threat bilaterally III,IV, VI: Tracks my motions in the room, pupils are reactive. V,VII: Right facial droop at rest VIII: hearing normal bilaterally  Motor: Lifts left arm antigravity otherwise, bilateral legs will bend at the knee to noxious stimuli but not raise off the bed Sensory: Spots to noxious stimuli Deep Tendon Reflexes: 2+ bilateral upper extremities, 1+ at knee jerk with no ankle jerk     Medications:  Scheduled: . chlorhexidine  15 mL Mouth Rinse BID  . donepezil  10 mg Oral Q1200  . feeding supplement (ENSURE ENLIVE)  237 mL Oral TID BM  . levETIRAcetam  500 mg Oral BID  . levothyroxine  75 mcg Oral QAC breakfast  . mouth rinse  15 mL Mouth Rinse q12n4p  . memantine  21 mg Oral Daily  . polyethylene glycol  17 g Oral Daily   Continuous:   Pertinent Labs/Diagnostics: -Sodium 132 -Glucose 157 - Creatinine 0.72 -BUN 6      Etta Quill PA-C [facility] [phone] I have seen the patient reviewed the note.  Assessment:  84 year old female with newly diagnosed irregular ring-enhancing left posterior left frontal lobe  lesion with surrounding vasogenic edema along with new onset seizure.  Unfortunately MRI is unable to be obtained.  I suspect that her persistent deficits are primarily due to the edema.  With no clear evidence of infectious etiology, I think that this is less likely and would favor starting steroids as this may give her some symptomatic improvement.  Recommendations: -Patient has been seen by ID and has made comments.  They would favor a aspirate by neurosurgery if they are willing to perform.  At this time they are holding on antibiotics. - Would continue Keppra at this time. - I would start decadron 2mg  q6h following single 10mg  dose.   Roland Rack, MD [facility] [phone]  If 7pm- 7am, please page neurology on call as listed in [place].  11/16/2017, 9:25 AM

## 2017-11-16 NOTE — Progress Notes (Signed)
         [facility] for Infectious Disease  Date of Admission:  11/12/2017   Total days of antibiotics 0        Stopped Ceftriaxone for UTI yesterday           Patient ID: Kristine Holden is a 84 y.o. F with  Principal Problem:   Seizures ([place]) Active Problems:   OA (osteoarthritis) of knee   First degree AV block   Sinus bradycardia   SAH (subarachnoid hemorrhage) ([facility])   Hypothyroidism   Paroxysmal A-fib ([place])   CNS lesion   . chlorhexidine  15 mL Mouth Rinse BID  . donepezil  10 mg Oral Q1200  . feeding supplement (ENSURE ENLIVE)  237 mL Oral TID BM  . levETIRAcetam  500 mg Oral BID  . levothyroxine  75 mcg Oral QAC breakfast  . mouth rinse  15 mL Mouth Rinse q12n4p  . memantine  21 mg Oral Daily  . polyethylene glycol  17 g Oral Daily    SUBJECTIVE: Nonverbal and no family present   Allergies  Allergen Reactions  . Augmentin [Amoxicillin-Pot Clavulanate] Nausea And Vomiting  . Quinine Derivatives Other (See Comments)    Pt states "im just allergic" unknown reaction  . Penicillins Rash    OBJECTIVE: Vitals:   11/16/17 0015 11/16/17 0420 11/16/17 0500 11/16/17 0804  BP: (!) 163/63 (!) 158/60    Pulse:      Resp:    20  Temp: 98.8 F (37.1 C) 98.7 F (37.1 C)  98.9 F (37.2 C)  TempSrc: Axillary Axillary  Axillary  SpO2:  97%    Weight:   84.4 kg    Body mass index is 30.03 kg/m.  Physical Exam  Constitutional: She appears well-developed.  Resting quietly in bed   HENT:  Mouth/Throat: Oropharynx is clear and moist.  Eyes: Pupils are equal, round, and reactive to light. No scleral icterus.  Cardiovascular: Normal rate, regular rhythm and normal heart sounds.  Pulmonary/Chest: Effort normal.  Abdominal: Soft.  Neurological: She is alert.  Aphasic   Skin: Skin is warm and dry.  Vitals reviewed.   Lab Results Lab Results  Component Value Date   WBC 9.6 11/15/2017   HGB 13.1 11/15/2017   HCT 39.7 11/15/2017   MCV 94.3 11/15/2017   PLT 148 (L) 11/15/2017    Lab Results  Component Value Date   CREATININE 0.72 11/15/2017   BUN 6 (L) 11/15/2017   NA 132 (L) 11/15/2017   K 3.5 11/15/2017   CL 104 11/15/2017   CO2 23 11/15/2017    Lab Results  Component Value Date   ALT 26 11/13/2017   AST 38 11/13/2017   ALKPHOS 73 11/13/2017   BILITOT 1.7 (H) 11/13/2017     Microbiology: BCx 9/28 >> no growth    ASSESSMENT:  84 y.o. female with CNS mass found after presenting with R sided weakness, slurred speech and seizures. POA per chart review not interested in neurosurgery eval for consideration of aspiration. Would recommend initiation of empiric treatment for brain abscess with vancomycin and meropenem with repeated imaging to assess for improvement.    PLAN:  1. If not interested in nsgy eval - start vancomycin and meropenem 2. Will need PICC line placement and repeated imaging to follow  Janene Madeira, MSN, NP-C [facility] for Infectious [place] Cell: [phone] Pager: [phone]  11/16/2017  9:28 AM

## 2017-11-16 NOTE — Progress Notes (Signed)
[place] TEAM 1 - Stepdown/ICU TEAM  Kristine Holden  MAU:[id number] DOB: 1927/03/22 DOA: 11/12/2017 PCP: Blanchie Serve, MD    Brief Narrative:  84 y.o.femalew/ a hx of atrial fibrillation on Eliquis, hypothyroidism, dementia, osteoarthritis, and AV block with a pacemaker in place who was sent to the hospital for evaluation of right-sided weakness and slurred speech. CT of the head showed a 26 x 14 x 26 mm ring-enhancing mass in the posterior left frontal lobe with vasogenic edema concerning for abscess versus malignancy versus hematoma.Patient was seen by Neurology in the ER. There was concern that she suffered a partial seizure in the ER due to twitching of her face and her arm.   Subjective: MS stable today compared to yesterday. She tracks me around the room w/ her eyes, and moves her L arm, but does not appear to voluntarily move her R arm or leg. She does not appear uncomfortable. There is no respiratory distress.    Assessment & Plan:  Acute slurred speech and right-sided weakness - Newly diagnosed posterior L frontal brain lesion w/ mass effect/edema  Unclear if this is a hemorrhage, abscess, or metastatic lesion - her pacer is reportedly not MRI compatible - CT chest/abdom/pelvis w/o evidence of a primary CA making metastatic disease less likely - remains off anticoag - had extensive discussion w/ niece/POA at bedside 11/15/17 - apart from an infectious etiology that could be tx, it is not likely that further investigation will prove fruitful for the pt in terms of recovery - POA has reported the pt would NOT be interested in invasive aggressive procedures, so I suspect she would refuse an aspiration by NS - I have attempted to reach her by phone but got not answer this AM - hold on NS consult until I am able to speak w/ her POA again - ID has suggested POA understands she will need to escalate to a SNF level of care for ongoing PT/OT/SLP, but that further recovery of deficits is not  likely   Facial twitching concerns for seizures - Keppra per Neuro - appears to have ceased    Marginal UA - possible UTI Has empirically been tx w/ rocephin, which ID stopped after 2 days of tx - no helpful culture data  Chronic Atrial fibrillation rate controlled - discussed need to cont to hold anticoag as this lesion could be due to bleeding - discussed risk of CVA w/ POA while off anticoag, but at present a higher risk of bleeding w/ use of anticoag - unless pt is found to have a infectious etiology for her brain lesion, she will need to stay off anticoag permanently    AV block with Sinus Bradycardia s/p pacer   Dementia Resumed home tx regimen   Hypothyroidism TSH WNL - cont synthroid   DVT prophylaxis: SCDs Code Status: DNR - NO CODE  Family Communication: called Niece/POA but got no answer today  Disposition Plan: SDU - will need SNF level of care at d/c, w/ timing depending upon ID eval  Consultants:  Neurology   Antimicrobials:  Rocephin 9/28 > 9/29  Objective: Blood pressure (!) 158/60, pulse 68, temperature 98.9 F (37.2 C), temperature source Axillary, resp. rate 20, weight 84.4 kg, SpO2 98 %.  Intake/Output Summary (Last 24 hours) at 11/16/2017 1006 Last data filed at 11/16/2017 0555 Gross per 24 hour  Intake 240 ml  Output 1500 ml  Net -1260 ml   Filed Weights   11/14/17 0500 11/15/17 0500 11/16/17 0500  Weight: 80.2 kg 81.2 kg 84.4 kg    Examination: General: No acute respiratory distress  Lungs: CTA B - no wheezing  Cardiovascular: RRR w/o M  Abdomen: NT/ND, soft, bs+, no mass, no rebound  Extremities: No signif edema B LE   CBC: Recent Labs  Lab 11/12/17 1131 11/12/17 1136 11/13/17 0439 11/15/17 0716  WBC 9.2  --  10.5 9.6  NEUTROABS 6.7  --   --   --   HGB 14.0 14.6 14.1 13.1  HCT 42.5 43.0 43.3 39.7  MCV 95.9  --  95.8 94.3  PLT 193  --  186 161*   Basic Metabolic Panel: Recent Labs  Lab 11/12/17 1131 11/12/17 1136  11/12/17 1718 11/13/17 0439 11/15/17 0716  NA 139 139  --  135 132*  K 4.2 4.2  --  4.5 3.5  CL 109 107  --  106 104  CO2 23  --   --  22 23  GLUCOSE 108* 104*  --  134* 157*  BUN 10 11  --  8 6*  CREATININE 0.73 0.70  --  0.76 0.72  CALCIUM 10.5*  --   --  10.1 9.8  MG  --   --  2.0  --   --    GFR: Estimated Creatinine Clearance: 53.2 mL/min (by C-G formula based on SCr of 0.72 mg/dL).  Liver Function Tests: Recent Labs  Lab 11/12/17 1131 11/13/17 0439  AST 24 38  ALT 26 26  ALKPHOS 68 73  BILITOT 0.9 1.7*  PROT 6.6 6.6  ALBUMIN 3.4* 3.4*   Coagulation Profile: Recent Labs  Lab 11/12/17 1131 11/13/17 0439  INR 1.13 1.07    CBG: Recent Labs  Lab 11/15/17 0043 11/15/17 0631 11/15/17 1142 11/15/17 2347 11/16/17 0646  GLUCAP 141* 149* 162* 142* 114*    Scheduled Meds: . chlorhexidine  15 mL Mouth Rinse BID  . donepezil  10 mg Oral Q1200  . feeding supplement (ENSURE ENLIVE)  237 mL Oral TID BM  . levETIRAcetam  500 mg Oral BID  . levothyroxine  75 mcg Oral QAC breakfast  . mouth rinse  15 mL Mouth Rinse q12n4p  . memantine  21 mg Oral Daily  . polyethylene glycol  17 g Oral Daily     LOS: 4 days   Cherene Altes, MD [facility] Office  [phone] Pager - Text Page per Amion  If 7PM-7AM, please contact night-coverage per Amion 11/16/2017, 10:06 AM

## 2017-11-16 NOTE — Evaluation (Signed)
Occupational Therapy Evaluation Patient Details Name: Kristine Holden MRN: [id number] DOB: 05/23/1927 Today's Date: 11/16/2017    History of Present Illness 84 y.o. 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 CT of the head showed a 26 x 14 x 26 mm ring-enhancing mass in the posterior left frontal lobe with vasogenic edema concerning for abscess versus malignancy versus hematoma. Seizure in ED.    Clinical Impression   Pt currently needs max assist to total +2 for simulated selfcare tasks sit to stand, functional transfers, and sit to stand. Right hemiparesis also present with inattention to the left side and impaired cognition as well as expressive deficits.  Will benefit from continued OT in acute care to progress independence.  Feel she will need SNF rehab over a longer time to reach min assist level or better.      Follow Up Recommendations  SNF;Supervision/Assistance - 24 hour    Equipment Recommendations  Other (comment)(TBD next venue of care)    Recommendations for Other Services       Precautions / Restrictions Precautions Precautions: Fall Precaution Comments: right hemiparesis Restrictions Weight Bearing Restrictions: No      Mobility Bed Mobility Overal bed mobility: Needs Assistance Bed Mobility: Supine to Sit;Sit to Supine;Rolling Rolling: Max assist   Supine to sit: Max assist Sit to supine: Max assist   General bed mobility comments: Pt needed assistance with bending up the right knee and bringing the RUE across her body for rolling to the left side.  max assist for transition from sidelying to sit, including bringing LEs off of the bed.  Transfers Overall transfer level: Needs assistance   Transfers: Squat Pivot Transfers     Squat pivot transfers: Total assist;+2 physical  assistance          Balance Overall balance assessment: Needs assistance Sitting-balance support: Feet supported Sitting balance-Leahy Scale: Poor Sitting balance - Comments: Mod to max assist for initial static sitting balance Postural control: Right lateral lean Standing balance support: No upper extremity supported Standing balance-Leahy Scale: Zero                             ADL either performed or assessed with clinical judgement   ADL Overall ADL's : Needs assistance/impaired Eating/Feeding: Minimal assistance;Sitting   Grooming: Wash/dry face;Wash/dry hands;Brushing hair;Sitting;Minimal assistance   Upper Body Bathing: Moderate assistance;Sitting(simulated) Upper Body Bathing Details (indicate cue type and reason): supported sitting in bedside recliner Lower Body Bathing: +2 for physical assistance;Sit to/from stand;Total assistance   Upper Body Dressing : Maximal assistance;Sitting   Lower Body Dressing: +2 for physical assistance;Total assistance   Toilet Transfer: Total assistance;+2 for physical assistance;Squat-pivot;BSC(simulated)   Toileting- Clothing Manipulation and Hygiene: +2 for physical assistance;Total assistance       Functional mobility during ADLs: Maximal assistance General ADL Comments: Max assist needed for supine to sit with max assist for initial sitting balance EOM.  She demonstrated flexed trunk and head as well as pusher tendencies to the right side.  She was able to maintain static sitting for 5-10 seconds once positioned to midline.  When given washcloth and told to wash her face she was able to complete this, but did not attempt washing the RUE when cued to do so.       Vision Baseline Vision/History:  No visual deficits Patient Visual Report: No change from baseline(pt unable to state) Vision Assessment?: Yes Eye Alignment: Within Functional Limits Tracking/Visual Pursuits: Decreased smoothness of horizontal tracking;Other  (comment)(Pt demonstrated lost fixation when tracking from right to left) Visual Fields: Other (comment)(Unable to determine this session.  Will continue to examine in functional context)     Perception     Praxis      Pertinent Vitals/Pain Pain Assessment: Faces Faces Pain Scale: No hurt     Hand Dominance Right   Extremity/Trunk Assessment Upper Extremity Assessment Upper Extremity Assessment: RUE deficits/detail RUE Deficits / Details: No active movement of the RUE noted, PROM WFLS for all joints   Lower Extremity Assessment Lower Extremity Assessment: Defer to PT evaluation   Cervical / Trunk Assessment Cervical / Trunk Assessment: Normal   Communication Communication Communication: Expressive difficulties;Receptive difficulties(pt did not verbalize during PT session, did not follow commands)   Cognition Arousal/Alertness: Awake/alert Behavior During Therapy: Flat affect Overall Cognitive Status: Impaired/Different from baseline Area of Impairment: Following commands                       Following Commands: Follows one step commands inconsistently       General Comments: Pt inconsistent with following one step commands.  Could follow visual and verbal cueing to raise the left arm but could not imulate elbow flexion or digit flexion to command.     General Comments       Exercises     Shoulder Instructions      Home Living Family/patient expects to be discharged to:: Skilled nursing facility                                        Prior Functioning/Environment          Comments: pt unable to provide PLOF, no family present        OT Problem List: Decreased strength;Impaired balance (sitting and/or standing);Decreased cognition;Decreased range of motion;Impaired vision/perception;Decreased safety awareness;Decreased activity tolerance;Decreased coordination;Decreased knowledge of use of DME or AE;Impaired sensation;Impaired UE  functional use      OT Treatment/Interventions: Self-care/ADL training;Therapeutic exercise;Patient/family education;Manual therapy;Neuromuscular education;Balance training;Splinting;Therapeutic activities;DME and/or AE instruction;Cognitive remediation/compensation    OT Goals(Current goals can be found in the care plan section) Acute Rehab OT Goals Patient Stated Goal: Pt did not state but nodded head in agreement to get out of bed. OT Goal Formulation: With patient Time For Goal Achievement: 11/30/17 Potential to Achieve Goals: Good  OT Frequency: Min 2X/week              AM-PAC PT "6 Clicks" Daily Activity     Outcome Measure Help from another person eating meals?: A Little Help from another person taking care of personal grooming?: A Little Help from another person toileting, which includes using toliet, bedpan, or urinal?: Total Help from another person bathing (including washing, rinsing, drying)?: Total Help from another person to put on and taking off regular upper body clothing?: Total Help from another person to put on and taking off regular lower body clothing?: Total 6 Click Score: 10   End of Session Nurse Communication: Need for lift equipment  Activity Tolerance: Patient tolerated treatment well Patient left: in chair;with call bell/phone within reach;with chair alarm set  OT Visit Diagnosis: Unsteadiness on feet (R26.81);Muscle weakness (generalized) (M62.81);Hemiplegia and hemiparesis Hemiplegia - Right/Left: Right Hemiplegia - dominant/non-dominant:  Dominant Hemiplegia - caused by: Cerebral infarction                Time: 0810-0849 OT Time Calculation (min): 39 min Charges:  OT General Charges $OT Visit: 1 Visit OT Evaluation $OT Eval Moderate Complexity: 1 Mod OT Treatments $Self Care/Home Management : 23-37 mins  Alistair Senft OTR/L 11/16/2017, 9:46 AM

## 2017-11-17 ENCOUNTER — Inpatient Hospital Stay: Payer: Self-pay

## 2017-11-17 DIAGNOSIS — G969 Disorder of central nervous system, unspecified: Secondary | ICD-10-CM

## 2017-11-17 LAB — GLUCOSE, CAPILLARY
Glucose-Capillary: 163 mg/dL — ABNORMAL HIGH (ref 70–99)
Glucose-Capillary: 178 mg/dL — ABNORMAL HIGH (ref 70–99)

## 2017-11-17 LAB — URINE CULTURE: Culture: NO GROWTH

## 2017-11-17 MED ORDER — LORAZEPAM 2 MG/ML IJ SOLN: 0.5000 mg | Freq: Once | INTRAMUSCULAR | Status: DC

## 2017-11-17 MED ORDER — FAMOTIDINE IN NACL 20-0.9 MG/50ML-% IV SOLN
20.0000 mg | Freq: Two times a day (BID) | INTRAVENOUS | Status: DC
  Administered 2017-11-17: 20 mg via INTRAVENOUS
  Filled 2017-11-17: qty 50

## 2017-11-17 MED ORDER — PANTOPRAZOLE SODIUM 40 MG PO TBEC
40.0000 mg | DELAYED_RELEASE_TABLET | Freq: Every day | ORAL | Status: DC
  Administered 2017-11-17 – 2017-11-19 (×3): 40 mg via ORAL
  Filled 2017-11-17 (×4): qty 1

## 2017-11-17 MED ORDER — VANCOMYCIN HCL 10 G IV SOLR
1500.0000 mg | Freq: Once | INTRAVENOUS | Status: AC
  Administered 2017-11-17: 1500 mg via INTRAVENOUS
  Filled 2017-11-17: qty 1500

## 2017-11-17 MED ORDER — SODIUM CHLORIDE 0.9 % IV SOLN
2.0000 g | Freq: Two times a day (BID) | INTRAVENOUS | Status: DC
  Administered 2017-11-17 – 2017-11-19 (×5): 2 g via INTRAVENOUS
  Filled 2017-11-17 (×7): qty 2

## 2017-11-17 MED ORDER — VANCOMYCIN HCL 10 G IV SOLR
1250.0000 mg | INTRAVENOUS | Status: DC
  Administered 2017-11-18 – 2017-11-19 (×2): 1250 mg via INTRAVENOUS
  Filled 2017-11-17 (×2): qty 1250

## 2017-11-17 NOTE — NC FL2 (Signed)
[place] LEVEL OF CARE SCREENING TOOL     IDENTIFICATION  Patient Name: Kristine Holden Birthdate: 1927-12-16 Sex: female Admission Date (Current Location): 11/12/2017  [facility] and [place] Number:  Herbalist and Address:  [facility] [place] [address], [place], [place] [address]      Provider Number: [id number]  Attending Physician Name and Address:  Bonnielee Haff, MD  Relative Name and Phone Number:       Current Level of Care: Hospital Recommended Level of Care: [place] Prior Approval Number:    Date Approved/Denied:   PASRR Number:    Discharge Plan: SNF    Current Diagnoses: Patient Active Problem List   Diagnosis Date Noted  . CNS lesion   . Right sided weakness 11/12/2017  . Seizures ([place]) 11/12/2017  . Subconjunctival hematoma, right 10/13/2017  . Hyperlipidemia 01/19/2017  . Liver function test abnormality 01/19/2017  . Weight loss 01/18/2017  . Allergic rhinitis 01/15/2017  . Hypercalcemia 12/11/2016  . Osteoporosis without current pathological fracture 11/12/2016  . Atypical mole 10/01/2016  . Chronic anticoagulation 09/16/2016  . Chronic constipation 09/16/2016  . Late onset Alzheimer's disease without behavioral disturbance ([place]) 07/21/2016  . Paroxysmal A-fib ([place]) 07/21/2016  . 1st degree AV block 08/12/2014  . Hypothyroidism 08/11/2014  . Stenosed aortic valve 08/11/2014  . Lichen sclerosus 27/25/3664  . Tachycardia with sick sinus syndrome, CHad2Vasc2=3, HASBLED=1 08/11/2014  . History of fracture of skull   . Atrial fibrillation with RVR ([place]) 08/10/2014  . SAH (subarachnoid hemorrhage) ([place]) 08/10/2014  . Syncope and collapse 08/10/2014  . S/P cataract surgery, right eye 08/10/2014  . First degree AV block 01/04/2014  . Primary osteoarthritis of left knee 01/04/2014  . Sinus bradycardia 01/04/2014  . OA (osteoarthritis) of knee 01/25/2012    Orientation RESPIRATION  BLADDER Height & Weight     Self  Normal Incontinent Weight: 181 lb 3.5 oz (82.2 kg) Height:     BEHAVIORAL SYMPTOMS/MOOD NEUROLOGICAL BOWEL NUTRITION STATUS      Continent Diet(soft)  AMBULATORY STATUS COMMUNICATION OF NEEDS Skin   Extensive Assist Verbally Normal                       Personal Care Assistance Level of Assistance  Bathing, Feeding, Dressing Bathing Assistance: Maximum assistance Feeding assistance: Limited assistance Dressing Assistance: Maximum assistance     Functional Limitations Info  Sight, Hearing, Speech Sight Info: Adequate Hearing Info: Adequate Speech Info: Impaired(expressive aphasia)    SPECIAL CARE FACTORS FREQUENCY  PT (By licensed PT), OT (By licensed OT), Speech therapy     PT Frequency: 2x/wk OT Frequency: 2x/wk     Speech Therapy Frequency: 2x/wk      Contractures Contractures Info: Not present    Additional Factors Info  Code Status, Allergies, Psychotropic Code Status Info: DNR Allergies Info: Augmentin Amoxicillin-pot Clavulanate, Quinine Derivatives, Penicillins Psychotropic Info: Aricept 10mg          Current Medications (11/17/2017):  This is the current hospital active medication list Current Facility-Administered Medications  Medication Dose Route Frequency Provider Last Rate Last Dose  . acetaminophen (TYLENOL) tablet 650 mg  650 mg Oral Q6H PRN Amin, Ankit Chirag, MD      . chlorhexidine (PERIDEX) 0.12 % solution 15 mL  15 mL Mouth Rinse BID Cherene Altes, MD   15 mL at 11/17/17 0959  . dexamethasone (DECADRON) injection 2 mg  2 mg Intravenous Q6H Roland Rack  P, MD   2 mg at 11/17/17 1210  . donepezil (ARICEPT) tablet 10 mg  10 mg Oral Q1200 Ronna Polio, RPH   10 mg at 11/17/17 1211  . feeding supplement (ENSURE ENLIVE) (ENSURE ENLIVE) liquid 237 mL  237 mL Oral TID BM Cherene Altes, MD   237 mL at 11/17/17 0958  . levETIRAcetam (KEPPRA) tablet 500 mg  500 mg Oral BID Cherene Altes, MD    500 mg at 11/17/17 0959  . levothyroxine (SYNTHROID, LEVOTHROID) tablet 75 mcg  75 mcg Oral QAC breakfast Cherene Altes, MD   75 mcg at 11/17/17 [phone]  . MEDLINE mouth rinse  15 mL Mouth Rinse q12n4p Cherene Altes, MD   15 mL at 11/17/17 1221  . memantine (NAMENDA XR) 24 hr capsule 21 mg  21 mg Oral Daily Ronna Polio, RPH   21 mg at 11/17/17 0959  . meropenem (MERREM) 2 g in sodium chloride 0.9 % 100 mL IVPB  2 g Intravenous Q12H Susa Raring, RPH 200 mL/hr at 11/17/17 1221 2 g at 11/17/17 1221  . pantoprazole (PROTONIX) EC tablet 40 mg  40 mg Oral Daily Greta Doom, MD      . polyethylene glycol (MIRALAX / GLYCOLAX) packet 17 g  17 g Oral Daily Cherene Altes, MD   17 g at 11/17/17 0959  . polyvinyl alcohol (LIQUIFILM TEARS) 1.4 % ophthalmic solution 1 drop  1 drop Both Eyes PRN Ronna Polio, RPH   1 drop at 11/16/17 1034  . senna-docusate (Senokot-S) tablet 1 tablet  1 tablet Oral QHS PRN Amin, Jeanella Flattery, MD      . Derrill Memo ON 11/18/2017] vancomycin (VANCOCIN) 1,250 mg in sodium chloride 0.9 % 250 mL IVPB  1,250 mg Intravenous Q24H Susa Raring, [facility]         Discharge Medications: Please see discharge summary for a list of discharge medications.  Relevant Imaging Results:  Relevant Lab Results:   Additional Information SS#: [id number]; will need PICC and IV antibiotics  Geralynn Ochs, LCSW

## 2017-11-17 NOTE — Progress Notes (Addendum)
  Speech Language Pathology Treatment: Dysphagia;Cognitive-Linquistic  Patient Details Name: Kristine Holden MRN: [id number] DOB: 03-Sep-1927 Today's Date: 11/17/2017 Time: 6578-4696 SLP Time Calculation (min) (ACUTE ONLY): 31 min  Assessment / Plan / Recommendation Clinical Impression  Treatment focused on both dysphagia and aphasia goals. Patient alert and cooperative. Continues to present with a significant global aphasia, expressive worse than receptive. Patient with intermittent automatic speech utterances at the word and sentence level, dysarthric in nature with intelligibility being at approximately 25% particularly out of the context of the conversation. Patient independently utilizing gestures to make basic needs known in approximately 25% of episodes. Able to answer Y/N questions to also make basic needs known with 30% accuracy. Patient able to follow directions to carryout familiar task with moderate-max cueing. Demonstrating obvious right sided inattention for which SLP provided max tactile and visual cueing to correct. New goal made to address this deficit. Overall, able to self feed and consume pureed solids and thin liquid without overt s/s of aspiration, appropriate oral transit time, and max SLP cueing for awareness of mild-moderate right sided anterior labial spillage. Overall, appears to be tolerating diet although declined soft solids today. Will continue to benefit from therapy to address both oral dysphagia and aphasia/cognitive deficits.    HPI HPI: Kristine Holden is a 84 y.o. female with medical history significant of with past medical history of atrial fibrillation on Eliquis, hypothyroidism, dementia, osteoarthritis, AV block with pacemaker in place was sent to the hospital for evaluation of right-sided weakness and slurred speech. Patient had a CT of the head done in the ER which showed ring-enhancing mass in the posterior left frontal lobe with vasogenic edema concerning for  abscess versus malignancy versus hematoma this is 26 x 14 x 26 mm.  Patient was seen by neurology in the ER.  There was concerns that she may have had a partial seizure in the ER due to twitching of her face and her arm.  Loading dose of IV Keppra was started and was given 1 time dose of 2 mg of IV Ativan.        SLP Plan  Continue with current plan of care       Recommendations  Diet recommendations: Dysphagia 3 (mechanical soft);Thin liquid Liquids provided via: Cup;Straw Medication Administration: Crushed with puree Supervision: Staff to assist with self feeding Compensations: Minimize environmental distractions;Slow rate;Small sips/bites;Lingual sweep for clearance of pocketing Postural Changes and/or Swallow Maneuvers: Seated upright 90 degrees;Upright 30-60 min after meal                Oral Care Recommendations: Oral care BID Follow up Recommendations: Skilled Nursing facility SLP Visit Diagnosis: Dysphagia, oral phase (R13.11) Plan: Continue with current plan of care       [facility] MA, [place] 11/17/2017, 9:49 AM

## 2017-11-17 NOTE — Progress Notes (Signed)
Attempt to get phone consent from niece (POA).  No answer.  Will attempt again later.

## 2017-11-17 NOTE — Progress Notes (Signed)
Physical Therapy Treatment Patient Details Name: Kristine Holden MRN: [id number] DOB: 19-Mar-1927 Today's Date: 11/17/2017    History of Present Illness 84 y.o. 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 CT of the head showed a 26 x 14 x 26 mm ring-enhancing mass in the posterior left frontal lobe with vasogenic edema concerning for abscess versus malignancy versus hematoma. Seizure in ED.     PT Comments    Patient seen for mobility progression. Pt is making gradual progress toward PT goals. Pt requires min-max A for bed mobility, min guard/min A for sitting balance EOB, and max A +2 for lateral scoot transfer bed to recliner. Continue to progress as tolerated with anticipated d/c to SNF for further skilled PT services.     Follow Up Recommendations  SNF;Supervision/Assistance - 24 hour     Equipment Recommendations  None recommended by PT    Recommendations for Other Services       Precautions / Restrictions Precautions Precautions: Fall Precaution Comments: right hemiparesis    Mobility  Bed Mobility Overal bed mobility: Needs Assistance Bed Mobility: Supine to Sit;Rolling Rolling: Max assist;Min assist   Supine to sit: Max assist;+2 for safety/equipment     General bed mobility comments: min A to roll toward R side with hand over hand cues for reaching and max A to roll to L side; assist to bring R LE/hips to EOB and to elevate trunk into sitting  Transfers Overall transfer level: Needs assistance   Transfers: Lateral/Scoot Transfers          Lateral/Scoot Transfers: Max assist;+2 physical assistance General transfer comment: use of gait belt and bed pad; cues for sequencing and placement of L hand ; pt assisted with L UE/LE   Ambulation/Gait                 Stairs              Wheelchair Mobility    Modified Rankin (Stroke Patients Only)       Balance Overall balance assessment: Needs assistance Sitting-balance support: Feet supported;Single extremity supported Sitting balance-Leahy Scale: Poor Sitting balance - Comments: pt able to maintain sitting balance EOB with min guard/min A                                     Cognition Arousal/Alertness: Awake/alert Behavior During Therapy: Flat affect Overall Cognitive Status: Impaired/Different from baseline Area of Impairment: Following commands;Orientation;Attention;Problem solving                 Orientation Level: Disoriented to;Place;Time;Situation     Following Commands: Follows one step commands inconsistently;Follows one step commands with increased time     Problem Solving: Slow processing;Difficulty sequencing;Requires verbal cues;Decreased initiation;Requires tactile cues        Exercises      General Comments General comments (skin integrity, edema, etc.): VSS      Pertinent Vitals/Pain Pain Assessment: No/denies pain    Home Living                      Prior Function            PT Goals (current goals can now be found in the care plan section) Progress towards PT goals:  Progressing toward goals    Frequency    Min 2X/week      PT Plan Current plan remains appropriate    Co-evaluation              AM-PAC PT "6 Clicks" Daily Activity  Outcome Measure  Difficulty turning over in bed (including adjusting bedclothes, sheets and blankets)?: Unable Difficulty moving from lying on back to sitting on the side of the bed? : Unable Difficulty sitting down on and standing up from a chair with arms (e.g., wheelchair, bedside commode, etc,.)?: Unable Help needed moving to and from a bed to chair (including a wheelchair)?: A Lot Help needed walking in hospital room?: Total Help needed climbing 3-5 steps with a railing? : Total 6 Click  Score: 7    End of Session Equipment Utilized During Treatment: Gait belt Activity Tolerance: Patient tolerated treatment well Patient left: with call bell/phone within reach;in chair;with chair alarm set Nurse Communication: Mobility status;Need for lift equipment PT Visit Diagnosis: Muscle weakness (generalized) (M62.81);Difficulty in walking, not elsewhere classified (R26.2);Hemiplegia and hemiparesis Hemiplegia - Right/Left: Right     Time: 1032-1100 PT Time Calculation (min) (ACUTE ONLY): 28 min  Charges:  $Therapeutic Activity: 23-37 mins                     Earney Navy, PTA Acute Rehabilitation Services Pager: [phone] Office: [phone]     Darliss Cheney 11/17/2017, 1:08 PM

## 2017-11-17 NOTE — Progress Notes (Signed)
PHARMACY CONSULT NOTE FOR:  OUTPATIENT  PARENTERAL ANTIBIOTIC THERAPY (OPAT)  Indication: Brain Abscess Regimen: Vancomycin 1250 Q 24 hours + Meropenem 2 gm every 12 hours End date: 12/28/17  IV antibiotic discharge orders are pended. To discharging provider:  please sign these orders via discharge navigator,  Select New Orders & click on the button choice - Manage This Unsigned Work.     Thank you for allowing pharmacy to be a part of this patient's care.  Susa Raring, PharmD, BCPS Infectious Diseases Clinical Pharmacist Phone: [phone] 11/17/2017, 10:36 AM

## 2017-11-17 NOTE — Progress Notes (Addendum)
Subjective: Patient able to mumble "alright" when asked how she is, not able to state any other  Questions. Attempts to talk but mumbles.   Exam: Vitals:   11/17/17 0351 11/17/17 0800  BP: 110/61 (!) 127/57  Pulse:  66  Resp:  18  Temp: 97.9 F (36.6 C) 98.9 F (37.2 C)  SpO2:  96%    Physical Exam   HEENT-  Normocephalic, no lesions, without obvious abnormality.  Normal external eye and conjunctiva.   Lungs-no rhonchi or wheezing noted, no excessive working breathing.  Saturations within normal limits Extremities- Warm, dry and intact Musculoskeletal-no joint tenderness, deformity or swelling Skin-warm and dry, no hyperpigmentation, vitiligo, or suspicious lesions    Neuro:  Mental Status: Alert,not oriented, attempts to follow commands and is able to follow some visual commands.  Cranial Nerves: II:  Blinks to threat on left III,IV, VI: ptosis on left, tracts me in room,  bilaterally pupils equal, round, reactive to light and accommodation V,VII: smile symmetric --right facial droop with grimmace, facial light touch sensation normal bilaterally VIII: hearing normal bilaterally XII: midline tongue extension Motor: No movement on the left UE or rLE with increased tone on right LE, able to lift arm antigravity on the left arm and lift left leg 2 inches off bed. Left DF and PF intact Sensory: no response to right noxious stimuli on right but clear response on the left.  Deep Tendon Reflexes: 2+left UE and one plus on left LE. 1+ right UE and no KJ on right with cross adductor.  Plantars: Up going bilaterally     Medications:  Scheduled: . chlorhexidine  15 mL Mouth Rinse BID  . dexamethasone  2 mg Intravenous Q6H  . donepezil  10 mg Oral Q1200  . feeding supplement (ENSURE ENLIVE)  237 mL Oral TID BM  . levETIRAcetam  500 mg Oral BID  . levothyroxine  75 mcg Oral QAC breakfast  . mouth rinse  15 mL Mouth Rinse q12n4p  . memantine  21 mg Oral Daily  . polyethylene  glycol  17 g Oral Daily   Continuous:  [id number] **OR** [DISCONTINUED] acetaminophen, polyvinyl alcohol, senna-docusate  Pertinent Labs/Diagnostics: None today   Etta Quill PA-C [facility] [phone]   Assessment: 84 year old female with newly diagnosed irregular ring-enhancing left posterior left frontal lobe lesion with surrounding vasogenic edema along with new onset seizure.  As noted before it is unfortunate that MRI is unable to be obtained.  I suspect that some of her deficits are primarily due to the edema.  ID has empirically started antibiotics for possible infectious etiology.  I have started Decadron 2 mg every 6 hours. At this time POA does not want to pursue neuro surgery. No facial twitching noted on Keppra.  Impression:  -Ring Enhancing lesion in left posterior left frontal lobe lesion with surrounding vasogenic edema along with new onset seizure -At this time POA does not want to proceed with neurosurgical intervention.  Recommendations: - continue Keppra -ID treating for possible abscess, though I do not feel that we can be certain -decadron 2mg  q6h for 1 week then 2 mg every 12 hours, then decrease as tolerated to off -Omeprazole while on Decadron -Please call with any further questions or concerns.   Roland Rack, MD [facility] [phone]  If 7pm- 7am, please page neurology on call as listed in [place].  11/17/2017, 8:45 AM

## 2017-11-17 NOTE — Progress Notes (Signed)
[facility] PROGRESS NOTE  Kristine Holden KDX:[id number] DOB: 1927/03/26 DOA: 11/12/2017  PCP: Blanchie Serve, MD  Brief History/Interval Summary: 84 y.o.femalew/ a hx of atrial fibrillation on Eliquis, hypothyroidism, dementia, osteoarthritis, and AV block with a pacemaker in place who was sent to the hospital for evaluation of right-sided weakness and slurred speech.CT of the head showed a 26 x 14 x 26 mm ring-enhancing mass in the posterior left frontal lobe with vasogenic edema concerning for abscess versus malignancy versus hematoma.Patient was seen by Neurology in the ER. There was concern that she suffered a partial seizure in the ER due to twitching of her face and her arm.  Reason for Visit: Left brain mass  Consultants: Neurology.  Infectious disease.   Procedures: None  Antibiotics: Vancomycin and meropenem  Subjective/Interval History: Patient is aphasic.  ROS: Unable to do due to her aphasia  Objective:  Vital Signs  Vitals:   11/17/17 0351 11/17/17 0500 11/17/17 0800 11/17/17 1215  BP: 110/61  (!) 127/57 124/64  Pulse:   66 64  Resp:   18 15  Temp: 97.9 F (36.6 C)  98.9 F (37.2 C) 98.9 F (37.2 C)  TempSrc: Axillary  Oral Oral  SpO2:   96% 99%  Weight:  82.2 kg      Intake/Output Summary (Last 24 hours) at 11/17/2017 1319 Last data filed at 11/16/2017 2013 Gross per 24 hour  Intake -  Output 325 ml  Net -325 ml   Filed Weights   11/15/17 0500 11/16/17 0500 11/17/17 0500  Weight: 81.2 kg 84.4 kg 82.2 kg    General appearance: alert, appears stated age, distracted and no distress Head: Normocephalic, without obvious abnormality, atraumatic Resp: clear to auscultation bilaterally Cardio: regular rate and rhythm, S1, S2 normal, no murmur, click, rub or gallop GI: soft, non-tender; bowel sounds normal; no masses,  no organomegaly Extremities: extremities normal, atraumatic, no cyanosis or edema Pulses: 2+ and symmetric Neurologic:  Patient is awake alert.  Right neglect is noted.  She is aphasic.  Right-sided weakness is present.  Lab Results:  Data Reviewed: I have personally reviewed following labs and imaging studies  CBC: Recent Labs  Lab 11/12/17 1131 11/12/17 1136 11/13/17 0439 11/15/17 0716  WBC 9.2  --  10.5 9.6  NEUTROABS 6.7  --   --   --   HGB 14.0 14.6 14.1 13.1  HCT 42.5 43.0 43.3 39.7  MCV 95.9  --  95.8 94.3  PLT 193  --  186 148*    Basic Metabolic Panel: Recent Labs  Lab 11/12/17 1131 11/12/17 1136 11/12/17 1718 11/13/17 0439 11/15/17 0716  NA 139 139  --  135 132*  K 4.2 4.2  --  4.5 3.5  CL 109 107  --  106 104  CO2 23  --   --  22 23  GLUCOSE 108* 104*  --  134* 157*  BUN 10 11  --  8 6*  CREATININE 0.73 0.70  --  0.76 0.72  CALCIUM 10.5*  --   --  10.1 9.8  MG  --   --  2.0  --   --     GFR: Estimated Creatinine Clearance: 52.6 mL/min (by C-G formula based on SCr of 0.72 mg/dL).  Liver Function Tests: Recent Labs  Lab 11/12/17 1131 11/13/17 0439  AST 24 38  ALT 26 26  ALKPHOS 68 73  BILITOT 0.9 1.7*  PROT 6.6 6.6  ALBUMIN 3.4* 3.4*  Coagulation Profile: Recent Labs  Lab 11/12/17 1131 11/13/17 0439  INR 1.13 1.07    CBG: Recent Labs  Lab 11/15/17 1142 11/15/17 2347 11/16/17 0646 11/17/17 0015 11/17/17 0634  GLUCAP 162* 142* 114* 178* 163*     Recent Results (from the past 240 hour(s))  Culture, blood (Routine X 2) w Reflex to ID Panel     Status: None (Preliminary result)   Collection Time: 11/13/17  9:48 AM  Result Value Ref Range Status   Specimen Description BLOOD LEFT ANTECUBITAL  Final   Special Requests   Final    BOTTLES DRAWN AEROBIC ONLY Blood Culture adequate volume   Culture   Final    NO GROWTH 4 DAYS Performed at [facility], [place] [address] [place], [place] [address]    Report Status PENDING  Incomplete  Culture, blood (Routine X 2) w Reflex to ID Panel     Status: None (Preliminary result)   Collection Time:  11/13/17  9:53 AM  Result Value Ref Range Status   Specimen Description BLOOD FINGER LEFT  Final   Special Requests   Final    BOTTLES DRAWN AEROBIC ONLY Blood Culture adequate volume   Culture   Final    NO GROWTH 4 DAYS Performed at [facility], [place] [address], [place], [place] [address]    Report Status PENDING  Incomplete  Culture, Urine     Status: None   Collection Time: 11/13/17  8:52 PM  Result Value Ref Range Status   Specimen Description URINE, CATHETERIZED  Final   Special Requests NONE  Final   Culture   Final    NO GROWTH Performed at [facility], [address] [place], [place] [address]    Report Status 11/17/2017 FINAL  Final      Radiology Studies: No results found.   Medications:  Scheduled: . chlorhexidine  15 mL Mouth Rinse BID  . dexamethasone  2 mg Intravenous Q6H  . donepezil  10 mg Oral Q1200  . feeding supplement (ENSURE ENLIVE)  237 mL Oral TID BM  . levETIRAcetam  500 mg Oral BID  . levothyroxine  75 mcg Oral QAC breakfast  . mouth rinse  15 mL Mouth Rinse q12n4p  . memantine  21 mg Oral Daily  . polyethylene glycol  17 g Oral Daily   Continuous: . famotidine (PEPCID) IV 20 mg (11/17/17 1008)  . meropenem (MERREM) IV 2 g (11/17/17 1221)  . [START ON 11/18/2017] vancomycin    . vancomycin     [id number] **OR** [DISCONTINUED] acetaminophen, polyvinyl alcohol, senna-docusate  Assessment/Plan:    Left brain mass causing slurred speech and right-sided weakness Etiology is unclear.  Differential is broad.  Could be a mass or an abscess or hematoma.  CT scan of the chest abdomen pelvis did not show any clear abnormality suggesting a primary cancer.  Further work-up will require invasive testing and a brain biopsy.  This issue was discussed with patient's power of attorney who is her niece.  No further testing to be done according to family's especially those of any invasive nature.  She does not want any neurosurgical  approach at this time.  Agreeable to a course of IV antibiotics.  If patient does not improve then she is also agreeable to palliative medicine.  Patient will need to go to skilled nursing facility as she has significant limitation of functional mobility due to her right paresis.  Seen by infectious disease and  recommended vancomycin and meropenem for 6 weeks.  PICC line will be ordered.  PT and OT evaluation.  Patient also on steroids which can be tapered down slowly over the next 1 week.  Facial twitching Concern for seizure.  Noted to be on Keppra.  Abnormal UA Urine culture without any growth.  Chronic atrial fibrillation/history of AV block status post pacemaker Since we do not have a definite diagnosis for the brain lesion patient will need to be off of anticoagulation.  This issue was discussed with patient's power of attorney.  Rate is well controlled.  History of dementia Stable.   Hypothyroidism Continue levothyroxine   DVT Prophylaxis: SCDs    Code Status: DNR Family Communication: Discussed with patient's niece who is her healthcare power of attorney Disposition Plan: Management as outlined above.  Eventually will need to go to skilled nursing facility.    LOS: 5 days   [place] [facility] Pager [phone] 11/17/2017, 1:19 PM  If 7PM-7AM, please contact night-coverage at [email or web address], password [facility]

## 2017-11-17 NOTE — Progress Notes (Signed)
-  Pharmacy Antibiotic Note  Kristine Holden is a 84 y.o. female admitted on 11/12/2017 with new onset seizures and found to have a newly diagnosed irregular ring enhancing lesion with possible infectious cause.  No culture data is currently available and no neurosurgery intervention planned.  Pharmacy has been consulted for Vancomycin and Meropenem dosing.Scr-0.72 and CrCl ~ 45 ml/min when adjusted for age and weight.   Plan: Vancomycin 1500 mg X 1 then 1250 mg every 24 hours Meropenem 2 gm every 12 hours  F/U renal function and clinical response  Weight: 181 lb 3.5 oz (82.2 kg)  Temp (24hrs), Avg:98.5 F (36.9 C), Min:97.9 F (36.6 C), Max:98.9 F (37.2 C)  Recent Labs  Lab 11/12/17 1131 11/12/17 1136 11/13/17 0439 11/15/17 0716  WBC 9.2  --  10.5 9.6  CREATININE 0.73 0.70 0.76 0.72    Estimated Creatinine Clearance: 52.6 mL/min (by C-G formula based on SCr of 0.72 mg/dL).    Allergies  Allergen Reactions  . Augmentin [Amoxicillin-Pot Clavulanate] Nausea And Vomiting  . Quinine Derivatives Other (See Comments)    Pt states "im just allergic" unknown reaction  . Penicillins Rash    Antimicrobials this admission: 10/2 Merrem>>(11/12) 10/2 Vancomycin>>(11/12)  Thank you for allowing pharmacy to be a part of this patient's care.  Susa Raring, PharmD, BCPS Infectious Diseases Clinical Pharmacist Phone: [phone] 11/17/2017 10:06 AM

## 2017-11-17 NOTE — Progress Notes (Signed)
Attempt to get phone consent multiple times with no answer.  Will attempt again letter

## 2017-11-17 NOTE — Progress Notes (Addendum)
INFECTIOUS DISEASE PROGRESS NOTE  ID: Kristine Holden is a 84 y.o. female with  Principal Problem:   Seizures ([place]) Active Problems:   OA (osteoarthritis) of knee   First degree AV block   Sinus bradycardia   SAH (subarachnoid hemorrhage) (HCC)   Hypothyroidism   Paroxysmal A-fib (HCC)   CNS lesion  Subjective: Alert, tracks to voice. Laughs, responds with garbled speech.   Abtx:  Anti-infectives (From admission, onward)   Start     Dose/Rate Route Frequency Ordered Stop   11/13/17 1600  cefTRIAXone (ROCEPHIN) 1 g in sodium chloride 0.9 % 100 mL IVPB  Status:  Discontinued     1 g 200 mL/hr over 30 Minutes Intravenous Every 24 hours 11/13/17 1511 11/15/17 1556      Medications:  Scheduled: . chlorhexidine  15 mL Mouth Rinse BID  . dexamethasone  2 mg Intravenous Q6H  . donepezil  10 mg Oral Q1200  . feeding supplement (ENSURE ENLIVE)  237 mL Oral TID BM  . levETIRAcetam  500 mg Oral BID  . levothyroxine  75 mcg Oral QAC breakfast  . mouth rinse  15 mL Mouth Rinse q12n4p  . memantine  21 mg Oral Daily  . polyethylene glycol  17 g Oral Daily    Objective: Vital signs in last 24 hours: Temp:  [97.9 F (36.6 C)-98.9 F (37.2 C)] 98.9 F (37.2 C) (10/02 0800) Pulse Rate:  [66] 66 (10/02 0800) Resp:  [18] 18 (10/02 0800) BP: (110-131)/(57-66) 127/57 (10/02 0800) SpO2:  [96 %] 96 % (10/02 0800) Weight:  [82.2 kg] 82.2 kg (10/02 0500)   General appearance: alert, cooperative and no distress Resp: clear to auscultation bilaterally Cardio: regular rate and rhythm GI: normal findings: bowel sounds normal and soft, non-tender Neurologic: Mental status: answers questions with garbled speech, attempts to cover herself with sheet after exam. laughs at comments.   Lab Results Recent Labs    11/15/17 0716  WBC 9.6  HGB 13.1  HCT 39.7  NA 132*  K 3.5  CL 104  CO2 23  BUN 6*  CREATININE 0.72   Liver Panel No results for input(s): PROT, ALBUMIN, AST, ALT,  ALKPHOS, BILITOT, BILIDIR, IBILI in the last 72 hours. Sedimentation Rate No results for input(s): ESRSEDRATE in the last 72 hours. C-Reactive Protein No results for input(s): CRP in the last 72 hours.  Microbiology: Recent Results (from the past 240 hour(s))  Culture, blood (Routine X 2) w Reflex to ID Panel     Status: None (Preliminary result)   Collection Time: 11/13/17  9:48 AM  Result Value Ref Range Status   Specimen Description BLOOD LEFT ANTECUBITAL  Final   Special Requests   Final    BOTTLES DRAWN AEROBIC ONLY Blood Culture adequate volume   Culture   Final    NO GROWTH 4 DAYS Performed at [facility], [address] [place], [place] [address]    Report Status PENDING  Incomplete  Culture, blood (Routine X 2) w Reflex to ID Panel     Status: None (Preliminary result)   Collection Time: 11/13/17  9:53 AM  Result Value Ref Range Status   Specimen Description BLOOD FINGER LEFT  Final   Special Requests   Final    BOTTLES DRAWN AEROBIC ONLY Blood Culture adequate volume   Culture   Final    NO GROWTH 4 DAYS Performed at [facility], [place] [address] [place], [place] [address]  Report Status PENDING  Incomplete    Studies/Results: No results found.   Assessment/Plan: CNS mass vs abscess  Total days of antibiotics: 1 vanco/merrem  Would aim for 6 weeks of above anbx She can f/u in ID clinic or with PCP.  Consider repeat MRI of head at that time.  Has she had palliative care eval? SNF?   Allergies  Allergen Reactions  . Augmentin [Amoxicillin-Pot Clavulanate] Nausea And Vomiting  . Quinine Derivatives Other (See Comments)    Pt states "im just allergic" unknown reaction  . Penicillins Rash    OPAT Orders Discharge antibiotics: vancomycin/merrem Per pharmacy protocol Aim for Vancomycin trough 15-20 (unless otherwise indicated) Duration: 42 days End Date: 12-28-17  [facility] Care Per Protocol: please  Labs weekly while on IV  antibiotics: _x_ CBC with differential __ BMP _x_ CMP __ CRP __ ESR _x_ Vancomycin trough __ CK  x__ Please pull PIC at completion of IV antibiotics __ Please leave PIC in place until doctor has seen patient or been notified  Fax weekly labs to [phone]  Clinic Follow Up Appt: PCP, SNF         Bobby Rumpf MD, FACP Infectious Diseases (pager) [phone] [email or web address] 11/17/2017, 9:47 AM  LOS: 5 days

## 2017-11-18 LAB — FUNGAL ANTIBODIES PANEL, ID-BLOOD
ASPERGILLUS FLAVUS: NEGATIVE
ASPERGILLUS FUMIGATUS IGG: NEGATIVE
Aspergillus niger: NEGATIVE
Blastomyces Abs, Qn, DID: NEGATIVE
HISTOPLASMA AB ID: NEGATIVE

## 2017-11-18 LAB — BASIC METABOLIC PANEL
ANION GAP: 6 (ref 5–15)
BUN: 16 mg/dL (ref 8–23)
CALCIUM: 9.6 mg/dL (ref 8.9–10.3)
CO2: 18 mmol/L — AB (ref 22–32)
CREATININE: 0.58 mg/dL (ref 0.44–1.00)
Chloride: 108 mmol/L (ref 98–111)
GFR calc Af Amer: >60 mL/min (ref >60)
GFR calc non Af Amer: >60 mL/min (ref >60)
GLUCOSE: 178 mg/dL — AB (ref 70–99)
Potassium: 4.8 mmol/L (ref 3.5–5.1)
Sodium: 132 mmol/L — ABNORMAL LOW (ref 135–145)

## 2017-11-18 LAB — CBC
HCT: 37.2 % (ref 36.0–46.0)
Hemoglobin: 12.6 g/dL (ref 12.0–15.0)
MCH: 31.3 pg (ref 26.0–34.0)
MCHC: 33.9 g/dL (ref 30.0–36.0)
MCV: 92.3 fL (ref 78.0–100.0)
PLATELETS: 176 10*3/uL (ref 150–400)
RBC: 4.03 MIL/uL (ref 3.87–5.11)
RDW: 13.2 % (ref 11.5–15.5)
WBC: 8.9 10*3/uL (ref 4.0–10.5)

## 2017-11-18 LAB — CULTURE, BLOOD (ROUTINE X 2)
Culture: NO GROWTH
Culture: NO GROWTH
SPECIAL REQUESTS: ADEQUATE
SPECIAL REQUESTS: ADEQUATE

## 2017-11-18 MED ORDER — DEXAMETHASONE 2 MG PO TABS
2.0000 mg | ORAL_TABLET | Freq: Four times a day (QID) | ORAL | Status: DC
  Administered 2017-11-18 – 2017-11-19 (×5): 2 mg via ORAL
  Filled 2017-11-18 (×5): qty 1

## 2017-11-18 MED ORDER — LEVETIRACETAM 100 MG/ML PO SOLN
500.0000 mg | Freq: Two times a day (BID) | ORAL | Status: DC
  Administered 2017-11-18 – 2017-11-19 (×2): 500 mg via ORAL
  Filled 2017-11-18 (×3): qty 5

## 2017-11-18 NOTE — Progress Notes (Signed)
CSW alerted by IV Team that they are struggling to reach patient's POA to obtain consent. CSW reached out to Admissions at [facility] to confirm POA name and number. POA is [name], and number to reach her is the correct home number on the patient's demographics. CSW called patient's POA [name] at [phone]. [name] answered the phone and indicated that she would be available to go over consent. [name] asked what a PICC line is, and CSW deferred to IV team to answer her questions because they had more information.   CSW confirmed patient's POA would be available for consenting. CSW to follow.  Laveda Abbe, [place] Clinical Social Worker [phone]

## 2017-11-18 NOTE — Progress Notes (Addendum)
Attempted to contact Clelia Schaumann niece of patient for consent for PICC.  Called [phone] and [phone] as per patient chart.  Phone rang then went to voicemail with no indication of the owner of the number therefore no message left.  Will attempt to contact again later.  Carolee Rota, RN VAST

## 2017-11-18 NOTE — Clinical Social Work Note (Signed)
Clinical Social Work Assessment  Patient Details  Name: Kristine Holden MRN: [id number] Date of Birth: [birth year]  Date of referral:  11/18/17               Reason for consult:  Facility Placement                Permission sought to share information with:  Facility Sport and exercise psychologist, Family Supports Permission granted to share information::  Yes, Verbal Permission Granted  Name::     Nurse, learning disability::  [facility]  Relationship::  Producer, television/film/video Information:     Housing/Transportation Living arrangements for the past 2 months:  [place] of Information:  Scientist, water quality, Power of [place] Patient Interpreter Needed:  None Criminal Activity/Legal Involvement Pertinent to Current Situation/Hospitalization:  No - Comment as needed Significant Relationships:  Other Family Members Lives with:  Self, Facility Resident Do you feel safe going back to the place where you live?  Yes Need for family participation in patient care:  Yes (Comment)  Care giving concerns:  Patient from [facility] and family has no concerns about care received.   Social Worker assessment / plan:  CSW received call from [place] [facility] to discuss patient's current status, and discussed how patient will need to transfer to [facility] because [place] is not able to care for patient with a PICC line. [facility] to reach out to family to confirm that they agree to this. CSW contacted patient's POA to confirm plan to return to [facility] when ready. CSW to follow.  Employment status:  Retired Nurse, adult PT Recommendations:  [place] / Referral to community resources:  [place]  Patient/Family's Response to care:  Patient's niece agreeable for patient to return to SNF.  Patient/Family's Understanding of and Emotional Response to Diagnosis, Current Treatment, and  Prognosis:  Patient's niece agreeable for patient to return to SNF and knows that she's going to have to move to a different part of the facility for a little while to get her needs met. Patient's SNF appreciative of updates on patient's status.  Emotional Assessment Appearance:  Appears stated age Attitude/Demeanor/Rapport:  Unable to Assess Affect (typically observed):  Unable to Assess Orientation:  Oriented to Self Alcohol / Substance use:  Not Applicable Psych involvement (Current and /or in the community):  No (Comment)  Discharge Needs  Concerns to be addressed:  Care Coordination Readmission within the last 30 days:  No Current discharge risk:  Physical Impairment, Cognitively Impaired, Dependent with Mobility Barriers to Discharge:  Continued Medical Work up   [employer], [place] 11/18/2017, 3:05 PM

## 2017-11-18 NOTE — Progress Notes (Signed)
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 There was concern that she suffered a partial seizure in the ER due to twitching of her face and her arm.  Reason for Visit: Left brain mass  Consultants: Neurology.  Infectious disease.   Procedures: None  Antibiotics: Vancomycin and meropenem  Subjective/Interval History: Patient remains aphasic.   Objective:  Vital Signs  Vitals:   11/17/17 2008 11/17/17 2300 11/18/17 0320 11/18/17 0734  BP: (!) 129/59 (!) 146/62 (!) 141/81 (!) 143/63  Pulse: 67 67 68 64  Resp: 18 19 (!) 22 18  Temp: 97.9 F (36.6 C) 97.9 F (36.6 C) 98 F (36.7 C) 97.8 F (36.6 C)  TempSrc: Oral Oral Oral Oral  SpO2: 97% 97% 97% 98%  Weight:        Intake/Output Summary (Last 24 hours) at 11/18/2017 0907 Last data filed at 11/18/2017 0324 Gross per 24 hour  Intake 600 ml  Output 1000 ml  Net -400 ml   Filed Weights   11/15/17 0500 11/16/17 0500 11/17/17 0500  Weight: 81.2 kg 84.4 kg 82.2 kg    General appearance: Awake alert.  In no distress Resp: Clear to auscultation bilaterally Cardio: S1-S2 is normal regular.  No S3-S4.  No rubs murmurs or bruit GI: Abdomen is soft.  Nontender nondistended.  Bowel sounds are present normal.  No masses organomegaly Extremities: No edema Neurologic: Patient is awake alert.  Right neglect is noted.  She is aphasic.  Right-sided weakness.    Lab Results:  Data  Reviewed: I have personally reviewed following labs and imaging studies  CBC: Recent Labs  Lab 11/12/17 1131 11/12/17 1136 11/13/17 0439 11/15/17 0716 11/18/17 0529  WBC 9.2  --  10.5 9.6 8.9  NEUTROABS 6.7  --   --   --   --   HGB 14.0 14.6 14.1 13.1 12.6  HCT 42.5 43.0 43.3 39.7 37.2  MCV 95.9  --  95.8 94.3 92.3  PLT 193  --  186 148* 812    Basic Metabolic Panel: Recent Labs  Lab 11/12/17 1131 11/12/17 1136 11/12/17 1718 11/13/17 0439 11/15/17 0716 11/18/17 0529  NA 139 139  --  135 132* 132*  K 4.2 4.2  --  4.5 3.5 4.8  CL 109 107  --  106 104 108  CO2 23  --   --  22 23 18*  GLUCOSE 108* 104*  --  134* 157* 178*  BUN 10 11  --  8 6* 16  CREATININE 0.73 0.70  --  0.76 0.72 0.58  CALCIUM 10.5*  --   --  10.1 9.8 9.6  MG  --   --  2.0  --   --   --     GFR: Estimated Creatinine Clearance: 52.6 mL/min (by C-G formula based on SCr of 0.58 mg/dL).  Liver Function Tests: Recent Labs  Lab 11/12/17 1131 11/13/17 0439  AST 24 38  ALT 26 26  ALKPHOS 68 73  BILITOT 0.9 1.7*  PROT 6.6 6.6  ALBUMIN 3.4* 3.4*     Coagulation Profile: Recent Labs  Lab 11/12/17 1131 11/13/17 0439  INR 1.13 1.07    CBG: Recent Labs  Lab 11/15/17 1142 11/15/17 2347 11/16/17 0646 11/17/17 0015 11/17/17 0634  GLUCAP 162* 142* 114* 178* 163*     Recent Results (from the past 240 hour(s))  Culture, blood (Routine X 2) w Reflex to ID Panel     Status: None   Collection Time: 11/13/17  9:48 AM  Result Value Ref Range Status   Specimen Description BLOOD LEFT ANTECUBITAL  Final   Special Requests   Final    BOTTLES DRAWN AEROBIC ONLY Blood Culture adequate volume   Culture   Final    NO GROWTH 5 DAYS Performed at [facility], [place] [address], [place], [place] [address]    Report Status 11/18/2017 FINAL  Final  Culture, blood (Routine X 2) w Reflex to ID Panel     Status: None   Collection Time: 11/13/17  9:53 AM  Result Value Ref Range Status   Specimen  Description BLOOD FINGER LEFT  Final   Special Requests   Final    BOTTLES DRAWN AEROBIC ONLY Blood Culture adequate volume   Culture   Final    NO GROWTH 5 DAYS Performed at [facility], [place] [address], [place], [place] [address]    Report Status 11/18/2017 FINAL  Final  Culture, Urine     Status: None   Collection Time: 11/13/17  8:52 PM  Result Value Ref Range Status   Specimen Description URINE, CATHETERIZED  Final   Special Requests NONE  Final   Culture   Final    NO GROWTH Performed at [facility], [address], [place], [place] [address]    Report Status 11/17/2017 FINAL  Final      Radiology Studies: [place] Ekg Site Rite  Result Date: 11/17/2017 If Site Rite image not attached, placement could not be confirmed due to current cardiac rhythm.    Medications:  Scheduled: . chlorhexidine  15 mL Mouth Rinse BID  . dexamethasone  2 mg Intravenous Q6H  . donepezil  10 mg Oral Q1200  . feeding supplement (ENSURE ENLIVE)  237 mL Oral TID BM  . levETIRAcetam  500 mg Oral BID  . levothyroxine  75 mcg Oral QAC breakfast  . LORazepam  0.5 mg Intravenous Once  . mouth rinse  15 mL Mouth Rinse q12n4p  . memantine  21 mg Oral Daily  . pantoprazole  40 mg Oral Daily  . polyethylene glycol  17 g Oral Daily   Continuous: . meropenem (MERREM) IV 2 g (11/17/17 2334)  . 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  Differential is broad.  Could be a mass or an abscess or hematoma.  CT scan of the chest abdomen pelvis did not show any clear abnormality suggesting a primary cancer.  Further work-up will require invasive testing and a brain biopsy.  This issue was discussed with patient's power of attorney who is her niece.  No further testing to be done according to family's especially those of any  invasive nature.  She does not want any neurosurgical intervention at this time.  Agreeable to a course of IV antibiotics.  If patient does not improve then she is also agreeable to palliative  medicine.  Patient will need to go to skilled nursing facility as she has significant limitation of functional mobility due to her right paresis.  Seen by infectious disease and recommended vancomycin and meropenem for 6 weeks.  Wait for placement of PICC line.  Steroids to be tapered down gradually.    Facial twitching Concern for seizure.  Patient was placed on Keppra which will be continued.  Abnormal UA Urine culture without any growth.  Chronic atrial fibrillation/history of AV block status post pacemaker Since we do not have a definite diagnosis for the brain lesion patient will need to be off of anticoagulation.  This issue was discussed with patient's power of attorney.  Rate is well controlled.  History of dementia Stable.   Hypothyroidism Continue levothyroxine   DVT Prophylaxis: SCDs    Code Status: DNR Family Communication: Discussed with patient's niece who is her healthcare power of attorney Disposition Plan: Management as outlined above.  Possible discharge to skilled nursing facility tomorrow.    LOS: 6 days   [facility] Pager [phone] 11/18/2017, 9:07 AM  If 7PM-7AM, please contact night-coverage at [email or web address], password [facility]

## 2017-11-18 NOTE — Progress Notes (Signed)
Occupational Therapy Treatment Patient Details Name: Kristine Holden MRN: [id number] DOB: [birth year] Today's Date: 11/18/2017    History of present illness 84 y.o. 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 CT of the head showed a 26 x 14 x 26 mm ring-enhancing mass in the posterior left frontal lobe with vasogenic edema concerning for abscess versus malignancy versus hematoma. Seizure in ED.    OT comments  Patient requires max assist +2 for bed mobility.  Engaged in EOB activities and able to sit EOB with mod to min guard assist during dynamic functional reaching cross body and cueing for R gaze.  LUE placed at midline and pt able to correct posture towards midline with 50% accuracy, otherwise requiring multimodal cueing. Min assist for washing face at EOB, noted pt 'spiting' food out of mouth during session. Fatigues easily and limited expressions during session.  Required +2 assist for lateral scoot transfers towards L side.  Will continue to follow.    Follow Up Recommendations  SNF;Supervision/Assistance - 24 hour    Equipment Recommendations  Other (comment)(TBD at next venue of care)    Recommendations for Other Services      Precautions / Restrictions Precautions Precautions: Fall Precaution Comments: right hemiparesis Restrictions Weight Bearing Restrictions: No       Mobility Bed Mobility Overal bed mobility: Needs Assistance Bed Mobility: Supine to Sit     Supine to sit: Max assist;+2 for physical assistance     General bed mobility comments: pt able to initate movement of L LE, required assistance for R LE and trunk support  Transfers Overall transfer level: Needs assistance   Transfers: Lateral/Scoot Transfers          Lateral/Scoot Transfers: Max assist;+2 physical assistance;+2  safety/equipment General transfer comment: gait belt and bed pad used for lateral scoot towards L side    Balance Overall balance assessment: Needs assistance Sitting-balance support: Feet supported;No upper extremity supported Sitting balance-Leahy Scale: Poor Sitting balance - Comments: min guard to modA to maintain sitting balance EOB, pushes with L UE but able to counteract with L hand at midline; able to follow cueing to maintain midline sitting for 5 minutes and reaching crossbody with L UE                                    ADL either performed or assessed with clinical judgement   ADL Overall ADL's : Needs assistance/impaired     Grooming: Wash/dry face;Minimal assistance;Sitting Grooming Details (indicate cue type and reason): min assist to maintain balance while washing face seated EOB         Upper Body Dressing : Maximal assistance;Sitting       Toilet Transfer: Maximal assistance;+2 for physical assistance;+2 for safety/equipment(lateral scoot, simulated to recliner ) Toilet Transfer Details (indicate cue type and reason): max A for physical assistance, sequencing and safety         Functional mobility during ADLs: Maximal assistance;+2 for physical assistance General ADL Comments: Pt engaged in bed mobility, grooming seated EOB, and transfers to recliner.      Vision   Vision Assessment?: Yes Eye Alignment: Within Functional Limits Alignment/Gaze Preference: Gaze left Additional Comments: left gaze preference but able to turn head and look towards R side with cued  Perception     Praxis      Cognition Arousal/Alertness: Awake/alert Behavior During Therapy: Flat affect Overall Cognitive Status: Impaired/Different from baseline Area of Impairment: Following commands;Attention;Problem solving                   Current Attention Level: Sustained   Following Commands: Follows one step commands inconsistently;Follows one step  commands with increased time     Problem Solving: Slow processing;Difficulty sequencing;Requires verbal cues;Decreased initiation;Requires tactile cues General Comments: Pt inconsistent with following 1 step commands, increased time to initate.  Difficult to assess due to aphasia.         Exercises Exercises: Other exercises Other Exercises Other Exercises: PROM from shoulder to hand R UE    Shoulder Instructions       General Comments      Pertinent Vitals/ Pain       Pain Assessment: Faces Faces Pain Scale: No hurt  Home Living                                          Prior Functioning/Environment              Frequency  Min 2X/week        Progress Toward Goals  OT Goals(current goals can now be found in the care plan section)  Progress towards OT goals: Progressing toward goals  Acute Rehab OT Goals Patient Stated Goal: none stated OT Goal Formulation: Patient unable to participate in goal setting Time For Goal Achievement: 11/30/17 Potential to Achieve Goals: Good  Plan Discharge plan remains appropriate;Frequency remains appropriate    Co-evaluation                 AM-PAC PT "6 Clicks" Daily Activity     Outcome Measure   Help from another person eating meals?: A Little Help from another person taking care of personal grooming?: A Little Help from another person toileting, which includes using toliet, bedpan, or urinal?: Total Help from another person bathing (including washing, rinsing, drying)?: Total Help from another person to put on and taking off regular upper body clothing?: Total Help from another person to put on and taking off regular lower body clothing?: Total 6 Click Score: 10    End of Session Equipment Utilized During Treatment: Gait belt  OT Visit Diagnosis: Unsteadiness on feet (R26.81);Muscle weakness (generalized) (M62.81);Hemiplegia and hemiparesis Hemiplegia - Right/Left: Right Hemiplegia -  dominant/non-dominant: Dominant Hemiplegia - caused by: Cerebral infarction   Activity Tolerance Patient tolerated treatment well   Patient Left in chair;with call bell/phone within reach;with chair alarm set   Nurse Communication Mobility status;Need for lift equipment        Time: 1308-6578 OT Time Calculation (min): 29 min  Charges: OT General Charges $OT Visit: 1 Visit OT Treatments $Self Care/Home Management : 23-37 mins  Delight Stare, OT Acute Rehabilitation Services Pager [phone] Office [phone]    Delight Stare 11/18/2017, 3:16 PM

## 2017-11-19 MED ORDER — DEXAMETHASONE 2 MG PO TABS: ORAL_TABLET | ORAL | Status: AC

## 2017-11-19 MED ORDER — ENSURE ENLIVE PO LIQD: 237.0000 mL | Freq: Three times a day (TID) | ORAL | 12 refills | Status: AC

## 2017-11-19 MED ORDER — SODIUM CHLORIDE 0.9% FLUSH
10.0000 mL | Freq: Two times a day (BID) | INTRAVENOUS | Status: DC
  Administered 2017-11-19: 10 mL

## 2017-11-19 MED ORDER — PANTOPRAZOLE SODIUM 40 MG PO TBEC: 40.0000 mg | DELAYED_RELEASE_TABLET | Freq: Every day | ORAL | Status: AC

## 2017-11-19 MED ORDER — LEVETIRACETAM 100 MG/ML PO SOLN: 500.0000 mg | Freq: Two times a day (BID) | ORAL | 12 refills | Status: AC

## 2017-11-19 MED ORDER — VANCOMYCIN IV (FOR PTA / DISCHARGE USE ONLY): 1250.0000 mg | INTRAVENOUS | 0 refills | Status: AC

## 2017-11-19 MED ORDER — SODIUM CHLORIDE 0.9% FLUSH: 10.0000 mL | INTRAVENOUS | Status: DC | PRN

## 2017-11-19 MED ORDER — MEROPENEM IV (FOR PTA / DISCHARGE USE ONLY): 2.0000 g | Freq: Two times a day (BID) | INTRAVENOUS | 0 refills | Status: AC

## 2017-11-19 NOTE — Plan of Care (Signed)
  Problem: Education: Goal: Knowledge of disease or condition will improve Outcome: Adequate for Discharge Goal: Knowledge of secondary prevention will improve Outcome: Adequate for Discharge Goal: Knowledge of patient specific risk factors addressed and post discharge goals established will improve Outcome: Adequate for Discharge   Problem: Health Behavior/Discharge Planning: Goal: Ability to manage health-related needs will improve Outcome: Adequate for Discharge   Problem: Nutrition: Goal: Risk of aspiration will decrease Outcome: Adequate for Discharge Goal: Dietary intake will improve Outcome: Adequate for Discharge   Problem: Ischemic Stroke/TIA Tissue Perfusion: Goal: Complications of ischemic stroke/TIA will be minimized Outcome: Adequate for Discharge   Problem: Education: Goal: Knowledge of General Education information will improve Description Including pain rating scale, medication(s)/side effects and non-pharmacologic comfort measures Outcome: Adequate for Discharge   Problem: Health Behavior/Discharge Planning: Goal: Ability to manage health-related needs will improve Outcome: Adequate for Discharge   Problem: Clinical Measurements: Goal: Ability to maintain clinical measurements within normal limits will improve Outcome: Adequate for Discharge Goal: Will remain free from infection Outcome: Adequate for Discharge Goal: Diagnostic test results will improve Outcome: Adequate for Discharge Goal: Respiratory complications will improve Outcome: Adequate for Discharge Goal: Cardiovascular complication will be avoided Outcome: Adequate for Discharge   Problem: Activity: Goal: Risk for activity intolerance will decrease Outcome: Adequate for Discharge   Problem: Nutrition: Goal: Adequate nutrition will be maintained Outcome: Adequate for Discharge   Problem: Coping: Goal: Level of anxiety will decrease Outcome: Adequate for Discharge   Problem:  Elimination: Goal: Will not experience complications related to bowel motility Outcome: Adequate for Discharge Goal: Will not experience complications related to urinary retention Outcome: Adequate for Discharge   Problem: Pain Managment: Goal: General experience of comfort will improve Outcome: Adequate for Discharge   Problem: Safety: Goal: Ability to remain free from injury will improve Outcome: Adequate for Discharge   Problem: Skin Integrity: Goal: Risk for impaired skin integrity will decrease Outcome: Adequate for Discharge

## 2017-11-19 NOTE — Discharge Summary (Signed)
[facility]  Physician Discharge Summary   Patient ID: Kristine Holden MRN: [id number] DOB/AGE: Nov 17, 1927 84 y.o.  Admit date: 11/12/2017 Discharge date: 11/19/2017  PCP: Blanchie Serve, MD  DISCHARGE DIAGNOSES:  Left brain mass, etiology unclear Right-sided hemiparesis as a result of the above Oropharyngeal dysphagia as a result of the above Chronic atrial fibrillation, now off of anticoagulation History of dementia History of hypothyroidism  RECOMMENDATIONS FOR OUTPATIENT FOLLOW UP: 1. Recommend palliative medicine consult at the skilled nursing facility for goals of care 2. May need prolonged IV antibiotics as mentioned under the medication list.  See recommendations regarding blood work to be done while patient is on antibiotics. 3. Patient has a PICC line in the left upper extremity.  This will need to be pulled out once patient has completed course of antibiotics.   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 There was concern that she suffered a partial seizure in the ER due to twitching of her face and her arm  Consultations:  Neurology  Infectious disease  Procedures:  PICC line placement   HOSPITAL COURSE:   Left brain mass causing slurred speech and right-sided weakness Etiology is unclear.  Differential is broad.  Could be a mass or an abscess or hematoma.  CT  scan of the chest abdomen pelvis did not show any clear abnormality suggesting a primary cancer.  Further work-up will require invasive testing and a brain biopsy.  This issue was discussed with patient's power of attorney who is her niece.  No further testing to be done according to family's especially those of any invasive nature.  She does not want any neurosurgical intervention at this time.  Agreeable to a course of IV antibiotics.  Will recommend that patient be seen by palliative medicine while she is at the skilled nursing facility for symptom management and goals of care if the patient declines. Patient will need to go to skilled nursing facility as she has significant limitation of functional mobility due to her right paresis.  Seen by infectious disease and recommended vancomycin and meropenem for 6 weeks.    Last day will be 12/28/2017.  PICC line was placed this morning.  Patient is on steroids which will be tapered down slowly.     Facial twitching Concern for seizure.  Patient was placed on Keppra which will be continued.  Abnormal UA Urine culture without any growth.  Chronic atrial fibrillation/history of AV block status post pacemaker Since we do not have a definite diagnosis for the brain lesion patient will need to be off of anticoagulation.  This issue was discussed with patient's power of attorney.  Rate is well controlled.  History of dementia Stable.   Hypothyroidism Continue levothyroxine  Overall stable.  Okay for discharge to skilled nursing facility.      PERTINENT LABS:  The results of significant diagnostics from this hospitalization (including imaging, microbiology, ancillary and laboratory) are  listed below for reference.    Microbiology: Recent Results (from the past 240 hour(s))  Culture, blood (Routine X 2) w Reflex to ID Panel     Status: None   Collection Time: 11/13/17  9:48 AM  Result Value Ref Range Status   Specimen Description BLOOD  LEFT ANTECUBITAL  Final   Special Requests   Final    BOTTLES DRAWN AEROBIC ONLY Blood Culture adequate volume   Culture   Final    NO GROWTH 5 DAYS Performed at [facility], [address] [place], [place] [address]    Report Status 11/18/2017 FINAL  Final  Culture, blood (Routine X 2) w Reflex to ID Panel     Status: None   Collection Time: 11/13/17  9:53 AM  Result Value Ref Range Status   Specimen Description BLOOD FINGER LEFT  Final   Special Requests   Final    BOTTLES DRAWN AEROBIC ONLY Blood Culture adequate volume   Culture   Final    NO GROWTH 5 DAYS Performed at [facility], [address] [place], [place] [address]    Report Status 11/18/2017 FINAL  Final  Culture, Urine     Status: None   Collection Time: 11/13/17  8:52 PM  Result Value Ref Range Status   Specimen Description URINE, CATHETERIZED  Final   Special Requests NONE  Final   Culture   Final    NO GROWTH Performed at [facility], [address] [place], [place] [address]    Report Status 11/17/2017 FINAL  Final     Labs: Basic Metabolic Panel: Recent Labs  Lab 11/12/17 1131 11/12/17 1136 11/12/17 1718 11/13/17 0439 11/15/17 0716 11/18/17 0529  NA 139 139  --  135 132* 132*  K 4.2 4.2  --  4.5 3.5 4.8  CL 109 107  --  106 104 108  CO2 23  --   --  22 23 18*  GLUCOSE 108* 104*  --  134* 157* 178*  BUN 10 11  --  8 6* 16  CREATININE 0.73 0.70  --  0.76 0.72 0.58  CALCIUM 10.5*  --   --  10.1 9.8 9.6  MG  --   --  2.0  --   --   --    Liver Function Tests: Recent Labs  Lab 11/12/17 1131 11/13/17 0439  AST 24 38  ALT 26 26  ALKPHOS 68 73  BILITOT 0.9 1.7*  PROT 6.6 6.6  ALBUMIN 3.4* 3.4*   CBC: Recent Labs  Lab 11/12/17 1131 11/12/17 1136 11/13/17 0439 11/15/17 0716 11/18/17 0529  WBC 9.2  --  10.5 9.6 8.9  NEUTROABS 6.7  --   --   --   --   HGB 14.0 14.6 14.1 13.1 12.6  HCT 42.5 43.0 43.3 39.7 37.2  MCV 95.9  --  95.8 94.3 92.3  PLT 193  --  186 148*  176    CBG: Recent Labs  Lab 11/15/17 1142 11/15/17 2347 11/16/17 0646 11/17/17 0015 11/17/17 0634  GLUCAP 162* 142* 114* 178* 163*     IMAGING STUDIES Ct Head W Contrast  Result Date: 11/12/2017 CLINICAL DATA:  Right-sided weakness, focal seizure. EXAM: CT HEAD WITH CONTRAST TECHNIQUE: Contiguous axial images were obtained from the base of the skull through the vertex with intravenous contrast. CONTRAST:  151m OMNIPAQUE IOHEXOL 300 MG/ML  SOLN COMPARISON:  Head CT 08/10/2014 FINDINGS: Brain: 23 x 14 x 26  mm ring-enhancing mass in the posterior left frontal lobe with vasogenic edema. No second mass is seen. There is a small focus of gliosis in the anterior left frontal lobe where there was traumatic hemorrhage in 2016. Generalized atrophy with ventriculomegaly. No infarct, high-density blood products, hydrocephalus, or collection. Vascular: No hyperdense vessel or unexpected calcification. Visible vessels are patent. Skull: No acute or aggressive finding. Sinuses/Orbits: Negative IMPRESSION: 23 x 14 x 26 mm solitary ring-enhancing mass in the posterior left frontal lobe with vasogenic edema. Abscess, malignancy, or subacute hematoma could have this appearance. Electronically Signed   By: Monte Fantasia M.D.   On: 11/12/2017 14:39   Ct Chest W Contrast  Result Date: 11/14/2017 CLINICAL DATA:  Brain mass, evaluate for primary malignancy EXAM: CT CHEST, ABDOMEN, AND PELVIS WITH CONTRAST TECHNIQUE: Multidetector CT imaging of the chest, abdomen and pelvis was performed following the standard protocol during bolus administration of intravenous contrast. CONTRAST:  127m OMNIPAQUE IOHEXOL 300 MG/ML  SOLN COMPARISON:  None. FINDINGS: CT CHEST FINDINGS Cardiovascular: No evidence of thoracic aortic aneurysm. Atherosclerotic calcifications of the aortic arch. The heart is normal in size. No pericardial effusion. Left subclavian pacemaker. Mediastinum/Nodes: No suspicious mediastinal lymphadenopathy.  Visualized left thyroid is mildly nodular/heterogeneous. Lungs/Pleura: Mild patchy bilateral lower lobe opacities, likely atelectasis. No suspicious pulmonary nodules. No focal consolidation. No pleural effusion or pneumothorax. Musculoskeletal: Visualized osseous structures are within normal limits. CT ABDOMEN PELVIS FINDINGS Hepatobiliary: Moderate to severe hepatic steatosis, with focal fatty sparing along the gallbladder fossa. Gallbladder sludge versus noncalcified gallstones. No associated inflammatory changes. No intrahepatic or extrahepatic ductal dilatation. Pancreas: Within normal limits. Spleen: Within normal limits. Adrenals/Urinary Tract: Adrenal glands are within normal limits. Kidneys are within normal limits.  No hydronephrosis. Bladder is within normal limits. Stomach/Bowel: Stomach is within normal limits. No evidence of bowel obstruction. Appendix is not discretely visualized, reportedly surgically absent. No colonic wall thickening or mass is evident on CT. Vascular/Lymphatic: No evidence of abdominal aortic aneurysm. Atherosclerotic calcifications of the abdominal aorta and branch vessels. No suspicious abdominopelvic lymphadenopathy. Reproductive: Status post hysterectomy. No adnexal masses. Other: No abdominopelvic ascites. Musculoskeletal: Degenerative changes of the lumbar spine. IMPRESSION: No findings suspicious for primary malignancy in the chest, abdomen, or pelvis. Moderate to severe hepatic steatosis. Gallbladder sludge versus noncalcified gallstones, without associated inflammatory changes. Status post appendectomy and hysterectomy. Electronically Signed   By: SJulian HyM.D.   On: 11/14/2017 22:46   Ct Abdomen Pelvis W Contrast  Result Date: 11/14/2017 CLINICAL DATA:  Brain mass, evaluate for primary malignancy EXAM: CT CHEST, ABDOMEN, AND PELVIS WITH CONTRAST TECHNIQUE: Multidetector CT imaging of the chest, abdomen and pelvis was performed following the standard protocol  during bolus administration of intravenous contrast. CONTRAST:  1045mOMNIPAQUE IOHEXOL 300 MG/ML  SOLN COMPARISON:  None. FINDINGS: CT CHEST FINDINGS Cardiovascular: No evidence of thoracic aortic aneurysm. Atherosclerotic calcifications of the aortic arch. The heart is normal in size. No pericardial effusion. Left subclavian pacemaker. Mediastinum/Nodes: No suspicious mediastinal lymphadenopathy. Visualized left thyroid is mildly nodular/heterogeneous. Lungs/Pleura: Mild patchy bilateral lower lobe opacities, likely atelectasis. No suspicious pulmonary nodules. No focal consolidation. No pleural effusion or pneumothorax. Musculoskeletal: Visualized osseous structures are within normal limits. CT ABDOMEN PELVIS FINDINGS Hepatobiliary: Moderate to severe hepatic steatosis, with focal fatty sparing along the gallbladder fossa. Gallbladder sludge versus noncalcified gallstones. No associated inflammatory changes. No intrahepatic or extrahepatic ductal dilatation. Pancreas: Within normal limits. Spleen: Within normal limits. Adrenals/Urinary Tract: Adrenal glands are within normal limits. Kidneys are within  normal limits.  No hydronephrosis. Bladder is within normal limits. Stomach/Bowel: Stomach is within normal limits. No evidence of bowel obstruction. Appendix is not discretely visualized, reportedly surgically absent. No colonic wall thickening or mass is evident on CT. Vascular/Lymphatic: No evidence of abdominal aortic aneurysm. Atherosclerotic calcifications of the abdominal aorta and branch vessels. No suspicious abdominopelvic lymphadenopathy. Reproductive: Status post hysterectomy. No adnexal masses. Other: No abdominopelvic ascites. Musculoskeletal: Degenerative changes of the lumbar spine. IMPRESSION: No findings suspicious for primary malignancy in the chest, abdomen, or pelvis. Moderate to severe hepatic steatosis. Gallbladder sludge versus noncalcified gallstones, without associated inflammatory changes.  Status post appendectomy and hysterectomy. Electronically Signed   By: Julian Hy M.D.   On: 11/14/2017 22:46   Ct Head Code Stroke Wo Contrast  Result Date: 11/12/2017 CLINICAL DATA:  Code stroke.  Right-sided weakness and aphasia. EXAM: CT HEAD WITHOUT CONTRAST TECHNIQUE: Contiguous axial images were obtained from the base of the skull through the vertex without intravenous contrast. COMPARISON:  08/10/2014 FINDINGS: Brain: There is moderately extensive vasogenic edema in the posterior left frontal lobe extending from the vertex into the operculum with the overlying cortex largely appearing preserved. An underlying centrally hypoattenuating lesion is suspected. No midline shift, acute intracranial hemorrhage, or extra-axial fluid collection is identified. There is mild-to-moderate cerebral atrophy. Vascular: Calcified atherosclerosis at the skull base. No hyperdense vessel. Skull: No acute fracture or focal osseous lesion. Interval healing of the depressed posterior skull fracture on the prior study. Sinuses/Orbits: Minimal right sphenoid sinus fluid. Moderate right mastoid effusion. Bilateral cataract extraction. Other: None. ASPECTS [facility] Stroke Program Early CT Score) Not scored due to suspected mass. IMPRESSION: 1. Extensive posterior left frontal lobe vasogenic edema with suspected underlying mass or possibly infection/abscess. Contrast-enhanced brain MRI is recommended for further evaluation. 2. No acute intracranial hemorrhage or definite infarct. These results were communicated to Dr. Cheral Marker at 11:43 am on 11/12/2017 by text page via the [facility] messaging system. Electronically Signed   By: Logan Bores M.D.   On: 11/12/2017 11:45   [place] Ekg Site Rite  Result Date: 11/17/2017 If Site Rite image not attached, placement could not be confirmed due to current cardiac rhythm.   DISCHARGE EXAMINATION: Vitals:   11/19/17 0409 11/19/17 0500 11/19/17 0737 11/19/17 1104  BP: 139/87  (!) 143/94  139/73  Pulse: 64  66 63  Resp: _0 Temp: 98 F (36.7 C)  97.8 F (36.6 C) 98.3 F (36.8 C)  TempSrc: Oral  Oral Oral  SpO2: 95%  94% 96%  Weight:  81.5 kg     General appearance: alert, cooperative, appears stated age and no distress Resp: clear to auscultation bilaterally Cardio: regular rate and rhythm, S1, S2 normal, no murmur, click, rub or gallop GI: soft, non-tender; bowel sounds normal; no masses,  no organomegaly  DISPOSITION: SNF  Discharge Instructions    Call MD for:  difficulty breathing, headache or visual disturbances   Complete by:  As directed    Call MD for:  extreme fatigue   Complete by:  As directed    Call MD for:  persistant dizziness or light-headedness   Complete by:  As directed    Call MD for:  persistant nausea and vomiting   Complete by:  As directed    Call MD for:  severe uncontrolled pain   Complete by:  As directed    Call MD for:  temperature >100.4   Complete by:  As directed    Discharge  instructions   Complete by:  As directed    Please review instructions on the discharge summary.  You were cared for by a hospitalist during your hospital stay. If you have any questions about your discharge medications or the care you received while you were in the hospital after you are discharged, you can call the unit and asked to speak with the hospitalist on call if the hospitalist that took care of you is not available. Once you are discharged, your primary care physician will handle any further medical issues. Please note that NO REFILLS for any discharge medications will be authorized once you are discharged, as it is imperative that you return to your primary care physician (or establish a relationship with a primary care physician if you do not have one) for your aftercare needs so that they can reassess your need for medications and monitor your lab values. If you do not have a primary care physician, you can call [phone] for a physician  referral.   Home infusion instructions [place] May follow [place] Dosing Protocol; May administer Cathflo as needed to maintain patency of vascular access device.; [place] of vascular access device: per [facility] Protocol: 0.9% NaCl pre/post medica...   Complete by:  As directed    Instructions:  May follow [place] Dosing Protocol   Instructions:  May administer Cathflo as needed to maintain patency of vascular access device.   Instructions:  [place] of vascular access device: per [facility] Protocol: 0.9% NaCl pre/post medication administration and prn patency; Heparin 100 u/ml, 69m for implanted ports and Heparin 10u/ml, 580mfor all other central venous catheters.   Instructions:  May follow [facility] Anaphylaxis Protocol for First Dose Administration in the home: 0.9% NaCl at 25-50 ml/hr to maintain IV access for protocol meds. Epinephrine 0.3 ml IV/IM PRN and Benadryl 25-50 IV/IM PRN s/s of anaphylaxis.   Instructions:  [facility] Coordinator (RN) to assist per patient IV care needs in the home PRN.   Increase activity slowly   Complete by:  As directed         Allergies as of 11/19/2017      Reactions   Augmentin [amoxicillin-pot Clavulanate] Nausea And Vomiting   Quinine Derivatives Other (See Comments)   Pt states "im just allergic" unknown reaction   Penicillins Rash      Medication List    STOP taking these medications   apixaban 5 MG Tabs tablet Commonly known as:  ELIQUIS   MUSCLE THERAPY/ARNICA Gel     TAKE these medications   acetaminophen 500 MG tablet Commonly known as:  TYLENOL Take 500 mg by mouth every 6 (six) hours as needed (PAIN).   dexamethasone 2 MG tablet Commonly known as:  DECADRON 2 mg 4 times daily for 6 days, then 80m280mwice daily for 1 week and then 80mg65mce daily and then taper off as per clinical status.   feeding supplement (ENSURE ENLIVE) Liqd Take 237 mLs by mouth 3 (three) times daily between meals.   levETIRAcetam 100  MG/ML solution Commonly known as:  KEPPRA Take 5 mLs (500 mg total) by mouth 2 (two) times daily.   levothyroxine 75 MCG tablet Commonly known as:  SYNTHROID, LEVOTHROID Take 75 mcg by mouth daily before breakfast.   Lutein-Zeaxanthin 20-1 MG Caps Take 1 tablet by mouth daily.   Magnesium 500 MG Caps Take 1 capsule by mouth daily.   meropenem  IVPB Commonly known as:  MERREM Inject 2 g  into the vein every 12 (twelve) hours. Indication:  Brain Abscess Last Day of Therapy:  12/28/17 Labs - Once weekly:  CBC/D and BMP, Labs - Every other week:  ESR and CRP   NAMZARIC 21-10 MG Cp24 Generic drug:  Memantine HCl-Donepezil HCl Take 1 tablet by mouth daily.   pantoprazole 40 MG tablet Commonly known as:  PROTONIX Take 1 tablet (40 mg total) by mouth daily.   polyethylene glycol packet Commonly known as:  MIRALAX / GLYCOLAX Take 17 g by mouth daily.   PREVAGEN 10 MG Caps Generic drug:  Apoaequorin Take 1 capsule by mouth daily.   SYSTANE 0.4-0.3 % Soln Generic drug:  Polyethyl Glycol-Propyl Glycol Apply 1 drop to eye 2 (two) times daily. For both eyes.   triamcinolone cream 0.1 % Commonly known as:  KENALOG Apply 1 application topically 2 (two) times daily as needed. Apply to hands and legs   vancomycin  IVPB Inject 1,250 mg into the vein daily. Indication:  Brain abscess Last Day of Therapy:  12/28/17 Labs - _0 /04/19 0940           Contact information for after-discharge care    Destination    [facility] .   Service:  Skilled Nursing Contact information: [address] [place] [phone]              TOTAL DISCHARGE TIME: 42 mins  Bonnielee Haff  [facility] Pager [phone]  11/19/2017, 11:16 AM

## 2017-11-19 NOTE — Progress Notes (Signed)
Physical Therapy Treatment Patient Details Name: Kristine Holden MRN: [id number] DOB: 07-10-1927 Today's Date: 11/19/2017    History of Present Illness 84 y.o. 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 CT of the head showed a 26 x 14 x 26 mm ring-enhancing mass in the posterior left frontal lobe with vasogenic edema concerning for abscess versus malignancy versus hematoma. Seizure in ED.     PT Comments    Patient progressing slowly towards PT goals. Tolerated sitting EOB doing dynamic reaching tasks working on balance and maintaining posture/midline. Pt able to follow gestural/demonstrational cues ~50% of the time. Attempted standing in stedy but pt reluctant to place weight through RLE so not able to clear bottom. Tolerated lateral scoot transfer with assist of 2- able to initiate some movement. Will follow.   Follow Up Recommendations  SNF;Supervision/Assistance - 24 hour     Equipment Recommendations  None recommended by PT    Recommendations for Other Services       Precautions / Restrictions Precautions Precautions: Fall Precaution Comments: right hemiparesis Restrictions Weight Bearing Restrictions: No    Mobility  Bed Mobility Overal bed mobility: Needs Assistance Bed Mobility: Supine to Sit     Supine to sit: Max assist;+2 for physical assistance     General bed mobility comments: pt able to initate movement of L LE, required assistance for R LE and trunk support  Transfers Overall transfer level: Needs assistance Equipment used: (stedy) Transfers: Lateral/Scoot Transfers          Lateral/Scoot Transfers: Max assist;+2 physical assistance;+2 safety/equipment General transfer comment: gait belt and bed pad used for lateral scoot towards Lft side. Also attempted standing with stedy but  pt not placing weight through RLE.  Ambulation/Gait             General Gait Details: Unable   Stairs             Wheelchair Mobility    Modified Rankin (Stroke Patients Only) Modified Rankin (Stroke Patients Only) Pre-Morbid Rankin Score: Moderate disability Modified Rankin: Severe disability     Balance Overall balance assessment: Needs assistance Sitting-balance support: Feet supported;No upper extremity supported;Single extremity supported Sitting balance-Leahy Scale: Poor Sitting balance - Comments: min guard for close supervision seated EOB, reaching in all planes with LUE given min guard for safety; max cueing to maintain midline sitting posture Postural control: Right lateral lean Standing balance support: During functional activity Standing balance-Leahy Scale: Zero Standing balance comment: unable to stand today despite multiple attempts                            Cognition Arousal/Alertness: Awake/alert Behavior During Therapy: Flat affect Overall Cognitive Status: Difficult to assess Area of Impairment: Following commands;Attention;Problem solving                   Current Attention Level: Sustained   Following Commands: Follows one step commands inconsistently;Follows one step commands with increased time     Problem Solving: Slow processing;Difficulty sequencing;Requires verbal cues;Requires tactile cues General Comments: Pt inconsistent with following 1 step commands, increased time to initate. Does well with gestural/demonstrational cues.      Exercises Other Exercises Other Exercises: completed EOB dynamic reaching in all planes and weightbearing through R UE given support at elbow and hand  General Comments        Pertinent Vitals/Pain Pain Assessment: Faces Faces Pain Scale: No hurt    Home Living                      Prior Function            PT Goals (current goals can now be found in the  care plan section) Acute Rehab PT Goals Patient Stated Goal: none stated Progress towards PT goals: Progressing toward goals(slowly)    Frequency    Min 2X/week      PT Plan Current plan remains appropriate    Co-evaluation PT/OT/SLP Co-Evaluation/Treatment: Yes Reason for Co-Treatment: Complexity of the patient's impairments (multi-system involvement);Necessary to address cognition/behavior during functional activity;For patient/therapist safety;To address functional/ADL transfers PT goals addressed during session: Mobility/safety with mobility;Balance        AM-PAC PT "6 Clicks" Daily Activity  Outcome Measure  Difficulty turning over in bed (including adjusting bedclothes, sheets and blankets)?: Unable Difficulty moving from lying on back to sitting on the side of the bed? : Unable Difficulty sitting down on and standing up from a chair with arms (e.g., wheelchair, bedside commode, etc,.)?: Unable Help needed moving to and from a bed to chair (including a wheelchair)?: A Lot Help needed walking in hospital room?: Total Help needed climbing 3-5 steps with a railing? : Total 6 Click Score: 7    End of Session Equipment Utilized During Treatment: Gait belt Activity Tolerance: Patient tolerated treatment well Patient left: in chair;with call bell/phone within reach;with chair alarm set Nurse Communication: Mobility status;Need for lift equipment PT Visit Diagnosis: Muscle weakness (generalized) (M62.81);Difficulty in walking, not elsewhere classified (R26.2);Hemiplegia and hemiparesis Hemiplegia - Right/Left: Right Hemiplegia - dominant/non-dominant: Dominant Hemiplegia - caused by: Cerebral infarction     Time: 4825-0037 PT Time Calculation (min) (ACUTE ONLY): 26 min  Charges:  $Therapeutic Activity: 8-22 mins                     Wray Kearns, PT, DPT Acute Rehabilitation Services Pager [phone] Office [phone]       Marguarite Arbour A  Sabra Heck 11/19/2017, 12:51 PM

## 2017-11-19 NOTE — Progress Notes (Signed)
Occupational Therapy Treatment Patient Details Name: Kristine Holden MRN: [id number] DOB: December 20, 1927 Today's Date: 11/19/2017    History of present illness 84 y.o. 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 CT of the head showed a 26 x 14 x 26 mm ring-enhancing mass in the posterior left frontal lobe with vasogenic edema concerning for abscess versus malignancy versus hematoma. Seizure in ED.    OT comments  Pt slowly progressing.  Continues to requires max A +2 for bed mobility and lateral scoot transfers, but demonstrates ability to maintain sitting balance EOB with min guard to close supervision for trunk control while reaching dynamically with L UE and  weightbearing through R UE with minA to support UE. Requires max cueing to sustain midline sitting posture statically.  Noted slight pocketing of food in R side of mouth with total assist to remove before drinking water. L gaze preference continues, but able to scan towards R when cued. Will continue to follow.    Follow Up Recommendations  SNF;Supervision/Assistance - 24 hour    Equipment Recommendations  Other (comment)(TBD at next venue of care)    Recommendations for Other Services      Precautions / Restrictions Precautions Precautions: Fall Precaution Comments: right hemiparesis Restrictions Weight Bearing Restrictions: No       Mobility Bed Mobility Overal bed mobility: Needs Assistance Bed Mobility: Supine to Sit     Supine to sit: Max assist;+2 for physical assistance     General bed mobility comments: pt able to initate movement of L LE, required assistance for R LE and trunk support  Transfers Overall transfer level: Needs assistance   Transfers: Lateral/Scoot Transfers          Lateral/Scoot Transfers: Max assist;+2 physical  assistance;+2 safety/equipment General transfer comment: gait belt and bed pad used for lateral scoot towards L side    Balance Overall balance assessment: Needs assistance Sitting-balance support: Feet supported;No upper extremity supported;Single extremity supported Sitting balance-Leahy Scale: Poor Sitting balance - Comments: min guard for close supervision seated EOB, reaching in all planes with L UE given min guard for safety; max cueing to maintain midline sitting posture                                   ADL either performed or assessed with clinical judgement   ADL Overall ADL's : Needs assistance/impaired Eating/Feeding: Set up;Supervision/ safety;Sitting Eating/Feeding Details (indicate cue type and reason): setup with cup to drink water; requires assist to clear mouth pocketing on R side                     Toilet Transfer: Maximal assistance;+2 for physical assistance;+2 for safety/equipment(lateral scoot towards L side into recliner ) Toilet Transfer Details (indicate cue type and reason): +2 for physical assistance, sesquencing and safety         Functional mobility during ADLs: Maximal assistance;+2 for physical assistance General ADL Comments: session focused on dynamic balance activities in preperation for ADL participation      Vision   Vision Assessment?: Yes Eye Alignment: Within Functional Limits Alignment/Gaze Preference: Gaze left Additional Comments: Left gaze preference, able to scan towards R side when cued   Perception     Praxis      Cognition  Arousal/Alertness: Awake/alert Behavior During Therapy: Flat affect Overall Cognitive Status: Impaired/Different from baseline Area of Impairment: Following commands;Attention;Problem solving                   Current Attention Level: Sustained   Following Commands: Follows one step commands inconsistently;Follows one step commands with increased time     Problem  Solving: Slow processing;Difficulty sequencing;Requires verbal cues;Decreased initiation;Requires tactile cues General Comments: Pt inconsistent with following 1 step commands, increased time to initate.  Difficult to assess due to aphasia.         Exercises Exercises: Other exercises Other Exercises Other Exercises: completed EOB dynamic reaching in all planes and weightbearing through R UE given support at elbow and hand    Shoulder Instructions       General Comments      Pertinent Vitals/ Pain       Pain Assessment: Faces Faces Pain Scale: No hurt  Home Living                                          Prior Functioning/Environment              Frequency  Min 2X/week        Progress Toward Goals  OT Goals(current goals can now be found in the care plan section)  Progress towards OT goals: Progressing toward goals  Acute Rehab OT Goals Patient Stated Goal: none stated OT Goal Formulation: Patient unable to participate in goal setting Time For Goal Achievement: 11/30/17 Potential to Achieve Goals: Good  Plan Discharge plan remains appropriate;Frequency remains appropriate    Co-evaluation                 AM-PAC PT "6 Clicks" Daily Activity     Outcome Measure   Help from another person eating meals?: A Little Help from another person taking care of personal grooming?: A Little Help from another person toileting, which includes using toliet, bedpan, or urinal?: Total Help from another person bathing (including washing, rinsing, drying)?: Total Help from another person to put on and taking off regular upper body clothing?: Total Help from another person to put on and taking off regular lower body clothing?: Total 6 Click Score: 10    End of Session Equipment Utilized During Treatment: Gait belt  OT Visit Diagnosis: Unsteadiness on feet (R26.81);Muscle weakness (generalized) (M62.81);Hemiplegia and hemiparesis Hemiplegia -  Right/Left: Right Hemiplegia - dominant/non-dominant: Dominant Hemiplegia - caused by: Cerebral infarction   Activity Tolerance Patient tolerated treatment well   Patient Left in chair;with call bell/phone within reach;with chair alarm set   Nurse Communication Mobility status        Time: 9211-9417 OT Time Calculation (min): 24 min  Charges: OT General Charges $OT Visit: 1 Visit OT Treatments $Self Care/Home Management : 8-22 mins  Delight Stare, [place] Pager [phone] Office [phone]    Delight Stare 11/19/2017, 12:38 PM

## 2017-11-19 NOTE — Progress Notes (Signed)
Discharge to: [facility] Anticipated discharge date: 11/19/17 Family notified: Yes, by phone Transportation by: [facility]  Report #: [phone], [place] [facility] Room 56; please give thorough report on PICC line type and IV antibiotics, per facility request  CSW signing off.  Laveda Abbe LCSW [phone]

## 2017-11-19 NOTE — Plan of Care (Signed)
?  Problem: Clinical Measurements: ?Goal: Ability to maintain clinical measurements within normal limits will improve ?Outcome: Not Progressing ?  ?

## 2017-11-19 NOTE — Plan of Care (Signed)
Problem: Education: Goal: Knowledge of disease or condition will improve 11/19/2017 1410 by Evalee Jefferson, RN Outcome: Adequate for Discharge 11/19/2017 1131 by Evalee Jefferson, RN Outcome: Adequate for Discharge Goal: Knowledge of secondary prevention will improve 11/19/2017 1410 by Bell Cai, Vertell Limber, RN Outcome: Adequate for Discharge 11/19/2017 1131 by Evalee Jefferson, RN Outcome: Adequate for Discharge Goal: Knowledge of patient specific risk factors addressed and post discharge goals established will improve 11/19/2017 1410 by Melanye Hiraldo, Vertell Limber, RN Outcome: Adequate for Discharge 11/19/2017 1131 by Evalee Jefferson, RN Outcome: Adequate for Discharge   Problem: Health Behavior/Discharge Planning: Goal: Ability to manage health-related needs will improve 11/19/2017 1410 by Evalee Jefferson, RN Outcome: Adequate for Discharge 11/19/2017 1131 by Evalee Jefferson, RN Outcome: Adequate for Discharge   Problem: Nutrition: Goal: Risk of aspiration will decrease 11/19/2017 1410 by Evalee Jefferson, RN Outcome: Adequate for Discharge 11/19/2017 1131 by Evalee Jefferson, RN Outcome: Adequate for Discharge Goal: Dietary intake will improve 11/19/2017 1410 by Evalee Jefferson, RN Outcome: Adequate for Discharge 11/19/2017 1131 by Evalee Jefferson, RN Outcome: Adequate for Discharge   Problem: Ischemic Stroke/TIA Tissue Perfusion: Goal: Complications of ischemic stroke/TIA will be minimized 11/19/2017 1410 by Evalee Jefferson, RN Outcome: Adequate for Discharge 11/19/2017 1131 by Evalee Jefferson, RN Outcome: Adequate for Discharge   Problem: Education: Goal: Knowledge of General Education information will improve Description Including pain rating scale, medication(s)/side effects and non-pharmacologic comfort measures 11/19/2017 1410 by Evalee Jefferson, RN Outcome: Adequate for  Discharge 11/19/2017 1131 by Evalee Jefferson, RN Outcome: Adequate for Discharge   Problem: Health Behavior/Discharge Planning: Goal: Ability to manage health-related needs will improve 11/19/2017 1410 by Evalee Jefferson, RN Outcome: Adequate for Discharge 11/19/2017 1131 by Evalee Jefferson, RN Outcome: Adequate for Discharge   Problem: Clinical Measurements: Goal: Ability to maintain clinical measurements within normal limits will improve 11/19/2017 1410 by Evalee Jefferson, RN Outcome: Adequate for Discharge 11/19/2017 1131 by Evalee Jefferson, RN Outcome: Adequate for Discharge Goal: Will remain free from infection 11/19/2017 1410 by Evalee Jefferson, RN Outcome: Adequate for Discharge 11/19/2017 1131 by Evalee Jefferson, RN Outcome: Adequate for Discharge Goal: Diagnostic test results will improve 11/19/2017 1410 by Evalee Jefferson, RN Outcome: Adequate for Discharge 11/19/2017 1131 by Evalee Jefferson, RN Outcome: Adequate for Discharge Goal: Respiratory complications will improve 11/19/2017 1410 by Evalee Jefferson, RN Outcome: Adequate for Discharge 11/19/2017 1131 by Evalee Jefferson, RN Outcome: Adequate for Discharge Goal: Cardiovascular complication will be avoided 11/19/2017 1410 by Evalee Jefferson, RN Outcome: Adequate for Discharge 11/19/2017 1131 by Evalee Jefferson, RN Outcome: Adequate for Discharge   Problem: Activity: Goal: Risk for activity intolerance will decrease 11/19/2017 1410 by Evalee Jefferson, RN Outcome: Adequate for Discharge 11/19/2017 1131 by Evalee Jefferson, RN Outcome: Adequate for Discharge   Problem: Nutrition: Goal: Adequate nutrition will be maintained 11/19/2017 1410 by Evalee Jefferson, RN Outcome: Adequate for Discharge 11/19/2017 1131 by Evalee Jefferson, RN Outcome: Adequate for Discharge   Problem: Coping: Goal: Level of anxiety  will decrease 11/19/2017 1410 by Evalee Jefferson, RN Outcome: Adequate for Discharge 11/19/2017 1131 by Evalee Jefferson, RN Outcome: Adequate for Discharge   Problem: Elimination: Goal: Will not experience complications related to bowel motility 11/19/2017 1410 by Evalee Jefferson, RN Outcome: Adequate for Discharge 11/19/2017 1131 by Evalee Jefferson, RN Outcome: Adequate for Discharge Goal: Will not experience complications related  to urinary retention 11/19/2017 1410 by Evalee Jefferson, RN Outcome: Adequate for Discharge 11/19/2017 1131 by Evalee Jefferson, RN Outcome: Adequate for Discharge   Problem: Pain Managment: Goal: General experience of comfort will improve 11/19/2017 1410 by Evalee Jefferson, RN Outcome: Adequate for Discharge 11/19/2017 1131 by Evalee Jefferson, RN Outcome: Adequate for Discharge   Problem: Safety: Goal: Ability to remain free from injury will improve 11/19/2017 1410 by Evalee Jefferson, RN Outcome: Adequate for Discharge 11/19/2017 1131 by Evalee Jefferson, RN Outcome: Adequate for Discharge   Problem: Skin Integrity: Goal: Risk for impaired skin integrity will decrease 11/19/2017 1410 by Evalee Jefferson, RN Outcome: Adequate for Discharge 11/19/2017 1131 by Evalee Jefferson, RN Outcome: Adequate for Discharge

## 2017-11-19 NOTE — Progress Notes (Signed)
Report called to [facility]. AVS and PICC placement verification sent with patient.

## 2017-11-19 NOTE — Progress Notes (Signed)
Peripherally Inserted Central Catheter/Midline Placement  The IV Nurse has discussed with the patient and/or persons authorized to consent for the patient, the purpose of this procedure and the potential benefits and risks involved with this procedure.  The benefits include less needle sticks, lab draws from the catheter, and the patient may be discharged home with the catheter. Risks include, but not limited to, infection, bleeding, blood clot (thrombus formation), and puncture of an artery; nerve damage and irregular heartbeat and possibility to perform a PICC exchange if needed/ordered by physician.  Alternatives to this procedure were also discussed.  [employer] PICC patient education guide, fact sheet on infection prevention and patient information card has been provided to patient /or left at bedside.    PICC/Midline Placement Documentation    Consent obtained via phone with niece Earline Mayotte 11/19/2017, 8:46 AM

## 2017-11-22 ENCOUNTER — Telehealth: Payer: Self-pay

## 2017-11-22 ENCOUNTER — Encounter: Payer: Self-pay | Admitting: Nurse Practitioner

## 2017-11-22 DIAGNOSIS — R1310 Dysphagia, unspecified: Secondary | ICD-10-CM | POA: Insufficient documentation

## 2017-11-22 NOTE — Telephone Encounter (Signed)
Possible re-admission to facility. This is a patient you were seeing at [facility]. [facility] F/U is needed if patient was re-admitted to facility upon discharge. Hospital discharge from [facility] on 11/19/2017

## 2017-11-23 ENCOUNTER — Emergency Department (HOSPITAL_COMMUNITY): Payer: Medicare Other

## 2017-11-23 ENCOUNTER — Encounter (HOSPITAL_COMMUNITY): Payer: Self-pay | Admitting: Emergency Medicine

## 2017-11-23 ENCOUNTER — Observation Stay (HOSPITAL_COMMUNITY)
Admission: EM | Admit: 2017-11-23 | Discharge: 2017-12-17 | Disposition: E | Payer: Medicare Other | Attending: Internal Medicine | Admitting: Internal Medicine

## 2017-11-23 DIAGNOSIS — Z880 Allergy status to penicillin: Secondary | ICD-10-CM | POA: Insufficient documentation

## 2017-11-23 DIAGNOSIS — G060 Intracranial abscess and granuloma: Principal | ICD-10-CM | POA: Insufficient documentation

## 2017-11-23 DIAGNOSIS — R402431 Glasgow coma scale score 3-8, in the field [EMT or ambulance]: Secondary | ICD-10-CM

## 2017-11-23 DIAGNOSIS — R569 Unspecified convulsions: Secondary | ICD-10-CM | POA: Insufficient documentation

## 2017-11-23 DIAGNOSIS — G301 Alzheimer's disease with late onset: Secondary | ICD-10-CM | POA: Diagnosis not present

## 2017-11-23 DIAGNOSIS — E785 Hyperlipidemia, unspecified: Secondary | ICD-10-CM | POA: Insufficient documentation

## 2017-11-23 DIAGNOSIS — Z792 Long term (current) use of antibiotics: Secondary | ICD-10-CM | POA: Insufficient documentation

## 2017-11-23 DIAGNOSIS — R4182 Altered mental status, unspecified: Secondary | ICD-10-CM | POA: Diagnosis present

## 2017-11-23 DIAGNOSIS — I495 Sick sinus syndrome: Secondary | ICD-10-CM | POA: Insufficient documentation

## 2017-11-23 DIAGNOSIS — E039 Hypothyroidism, unspecified: Secondary | ICD-10-CM | POA: Diagnosis not present

## 2017-11-23 DIAGNOSIS — Z96651 Presence of right artificial knee joint: Secondary | ICD-10-CM | POA: Diagnosis not present

## 2017-11-23 DIAGNOSIS — G9340 Encephalopathy, unspecified: Secondary | ICD-10-CM | POA: Diagnosis present

## 2017-11-23 DIAGNOSIS — Z79899 Other long term (current) drug therapy: Secondary | ICD-10-CM | POA: Diagnosis not present

## 2017-11-23 DIAGNOSIS — Z66 Do not resuscitate: Secondary | ICD-10-CM | POA: Diagnosis not present

## 2017-11-23 DIAGNOSIS — F0280 Dementia in other diseases classified elsewhere without behavioral disturbance: Secondary | ICD-10-CM | POA: Insufficient documentation

## 2017-11-23 DIAGNOSIS — Z950 Presence of cardiac pacemaker: Secondary | ICD-10-CM | POA: Insufficient documentation

## 2017-11-23 DIAGNOSIS — G9389 Other specified disorders of brain: Secondary | ICD-10-CM

## 2017-11-23 LAB — CBC WITH DIFFERENTIAL/PLATELET
ABS IMMATURE GRANULOCYTES: 0.1 10*3/uL — AB (ref 0.00–0.07)
BASOS ABS: 0.1 10*3/uL (ref 0.0–0.1)
Basophils Relative: 0 %
EOS PCT: 0 %
Eosinophils Absolute: 0 10*3/uL (ref 0.0–0.5)
HCT: 52.8 % — ABNORMAL HIGH (ref 36.0–46.0)
HEMOGLOBIN: 17.6 g/dL — AB (ref 12.0–15.0)
Immature Granulocytes: 1 %
LYMPHS PCT: 5 %
Lymphs Abs: 0.9 10*3/uL (ref 0.7–4.0)
MCH: 31.2 pg (ref 26.0–34.0)
MCHC: 33.3 g/dL (ref 30.0–36.0)
MCV: 93.6 fL (ref 80.0–100.0)
MONO ABS: 0.9 10*3/uL (ref 0.1–1.0)
Monocytes Relative: 5 %
NEUTROS ABS: 17.8 10*3/uL — AB (ref 1.7–7.7)
Neutrophils Relative %: 89 %
Platelets: 209 10*3/uL (ref 150–400)
RBC: 5.64 MIL/uL — AB (ref 3.87–5.11)
RDW: 13.5 % (ref 11.5–15.5)
WBC: 19.8 10*3/uL — ABNORMAL HIGH (ref 4.0–10.5)

## 2017-11-23 LAB — COMPREHENSIVE METABOLIC PANEL
ALK PHOS: 80 U/L (ref 38–126)
ALT: 26 U/L (ref 0–44)
ANION GAP: 11 (ref 5–15)
AST: 20 U/L (ref 15–41)
Albumin: 3.2 g/dL — ABNORMAL LOW (ref 3.5–5.0)
BILIRUBIN TOTAL: 1 mg/dL (ref 0.3–1.2)
BUN: 19 mg/dL (ref 8–23)
CO2: 25 mmol/L (ref 22–32)
Calcium: 10.9 mg/dL — ABNORMAL HIGH (ref 8.9–10.3)
Chloride: 96 mmol/L — ABNORMAL LOW (ref 98–111)
Creatinine, Ser: 0.72 mg/dL (ref 0.44–1.00)
GFR calc Af Amer: >60 mL/min (ref >60)
GLUCOSE: 270 mg/dL — AB (ref 70–99)
POTASSIUM: 3.1 mmol/L — AB (ref 3.5–5.1)
Sodium: 132 mmol/L — ABNORMAL LOW (ref 135–145)
TOTAL PROTEIN: 7.2 g/dL (ref 6.5–8.1)

## 2017-11-23 LAB — I-STAT CG4 LACTIC ACID, ED: Lactic Acid, Venous: 3.49 mmol/L (ref 0.5–1.9)

## 2017-11-23 LAB — I-STAT TROPONIN, ED: TROPONIN I, POC: 0 ng/mL (ref 0.00–0.08)

## 2017-11-23 MED ORDER — POLYVINYL ALCOHOL 1.4 % OP SOLN: 1.0000 [drp] | Freq: Four times a day (QID) | OPHTHALMIC | Status: DC | PRN

## 2017-11-23 MED ORDER — SODIUM CHLORIDE 0.9 % IV SOLN
Freq: Once | INTRAVENOUS | Status: AC
  Administered 2017-11-23: 06:00:00 via INTRAVENOUS

## 2017-11-23 MED ORDER — LORAZEPAM 2 MG/ML IJ SOLN
0.5000 mg | Freq: Once | INTRAMUSCULAR | Status: AC
  Administered 2017-11-23: 0.5 mg via INTRAVENOUS
  Filled 2017-11-23: qty 1

## 2017-11-23 MED ORDER — ONDANSETRON 4 MG PO TBDP: 4.0000 mg | ORAL_TABLET | Freq: Four times a day (QID) | ORAL | Status: DC | PRN

## 2017-11-23 MED ORDER — DEXAMETHASONE SODIUM PHOSPHATE 4 MG/ML IJ SOLN
10.0000 mg | Freq: Once | INTRAMUSCULAR | Status: AC
  Administered 2017-11-23: 10 mg via INTRAVENOUS
  Filled 2017-11-23: qty 3

## 2017-11-23 MED ORDER — SODIUM CHLORIDE 0.9 % IV BOLUS
1000.0000 mL | Freq: Once | INTRAVENOUS | Status: AC
  Administered 2017-11-23: 1000 mL via INTRAVENOUS

## 2017-11-23 MED ORDER — ONDANSETRON HCL 4 MG/2ML IJ SOLN: 4.0000 mg | Freq: Four times a day (QID) | INTRAMUSCULAR | Status: DC | PRN

## 2017-11-23 MED ORDER — GLYCOPYRROLATE 0.2 MG/ML IJ SOLN
0.1000 mg | Freq: Once | INTRAMUSCULAR | Status: AC
  Administered 2017-11-23: 0.1 mg via INTRAVENOUS
  Filled 2017-11-23: qty 1

## 2017-11-23 MED ORDER — MORPHINE SULFATE (PF) 2 MG/ML IV SOLN
1.0000 mg | INTRAVENOUS | Status: DC | PRN
  Administered 2017-11-23: 1 mg via INTRAVENOUS
  Filled 2017-11-23: qty 1

## 2017-11-23 MED ORDER — GLYCOPYRROLATE 1 MG PO TABS
1.0000 mg | ORAL_TABLET | ORAL | Status: DC | PRN
  Filled 2017-11-23: qty 1

## 2017-11-23 MED ORDER — BIOTENE DRY MOUTH MT LIQD: 15.0000 mL | OROMUCOSAL | Status: DC | PRN

## 2017-11-23 MED ORDER — GLYCOPYRROLATE 0.2 MG/ML IJ SOLN: 0.2000 mg | INTRAMUSCULAR | Status: DC | PRN

## 2017-11-28 LAB — CULTURE, BLOOD (ROUTINE X 2)
Culture: NO GROWTH
Culture: NO GROWTH

## 2017-12-17 NOTE — Accreditation Note (Signed)
At around 0600, RT went in to check on Pt because of BIPAP alarms. Pt not breathing over set RR and volumes were only about 148mL. Pt taken off BIPAP at that time.

## 2017-12-17 NOTE — ED Notes (Signed)
Remains unresponsive, GCS 6.

## 2017-12-17 NOTE — ED Triage Notes (Signed)
Pt arrives via EMS from [facility] with altered mental status with unknown LKW. Pt found with GCS 5 with room air sats 60s. DNR at bedside.

## 2017-12-17 NOTE — ED Notes (Signed)
TOD [id number]

## 2017-12-17 NOTE — Progress Notes (Signed)
Pt placed on BIPAP at this time per MD order.

## 2017-12-17 NOTE — ED Notes (Signed)
Unable to get in touch with family for care goals. Out of facility DNR at bedside.

## 2017-12-17 NOTE — ED Provider Notes (Addendum)
[facility] EMERGENCY DEPARTMENT Provider Note   CSN: [id number] Arrival date & time: December 10, 2017  0417     History   Chief Complaint Chief Complaint  Patient presents with  . Altered Mental Status    HPI Kristine Holden is a 84 y.o. female.  HPI  This is an 84 year old female brought in by EMS for altered mental status.  Last known well unknown.  Patient comes from friend's home.  She was found altered with room air sats in the 60s.  She does have a portable DNR.  Per report, recent brain abscess for which she has been treated with antibiotics.  I have reviewed the patient's chart.  She was discharged on October 4.  She was found to have a ring-enhancing lesion on the brain imaging.  Given her goals of care, no further diagnostic evaluation was pursued.  She was being treated for presumptive infective process with vancomycin and meropenem.  She is DNR per chart review and review of paperwork from nursing home.  Level 5 caveat  Past Medical History:  Diagnosis Date  . Arthritis   . First degree AV block   . Hypothyroidism   . Hypothyroidism    left thyroid  . Lichen sclerosus   . OA (osteoarthritis)    hands  . Osteoporosis   . Paroxysmal atrial fibrillation (H[facility])   . Sick sinus syndrome ([place])   . Vitamin D deficiency     Patient Active Problem List   Diagnosis Date Noted  . Dysphagia 11/22/2017  . CNS lesion   . Right sided weakness 11/12/2017  . Seizures ([place]) 11/12/2017  . Subconjunctival hematoma, right 10/13/2017  . Hyperlipidemia 01/19/2017  . Liver function test abnormality 01/19/2017  . Weight loss 01/18/2017  . Allergic rhinitis 01/15/2017  . Hypercalcemia 12/11/2016  . Osteoporosis without current pathological fracture 11/12/2016  . Atypical mole 10/01/2016  . Chronic anticoagulation 09/16/2016  . Chronic constipation 09/16/2016  . Late onset Alzheimer's disease without behavioral disturbance ([place]) 07/21/2016  . Paroxysmal A-fib ([place])  07/21/2016  . 1st degree AV block 08/12/2014  . Hypothyroidism 08/11/2014  . Stenosed aortic valve 08/11/2014  . Lichen sclerosus 76/81/1572  . Tachycardia with sick sinus syndrome, CHad2Vasc2=3, HASBLED=1 08/11/2014  . History of fracture of skull   . Atrial fibrillation with RVR ([place]) 08/10/2014  . SAH (subarachnoid hemorrhage) ([place]) 08/10/2014  . Syncope and collapse 08/10/2014  . S/P cataract surgery, right eye 08/10/2014  . First degree AV block 01/04/2014  . Primary osteoarthritis of left knee 01/04/2014  . Sinus bradycardia 01/04/2014  . OA (osteoarthritis) of knee 01/25/2012    Past Surgical History:  Procedure Laterality Date  . ABDOMINAL HYSTERECTOMY  1974  . APPENDECTOMY  1951  . EP IMPLANTABLE DEVICE N/A 08/13/2014   Procedure: Pacemaker Implant;  Surgeon: Deboraha Sprang, MD;  Location: [facility];  Service: Cardiovascular;  Laterality: N/A;  . HAND SURGERY  20years ago   open dislocation  . TONSILLECTOMY  1942   x2  . TOTAL KNEE ARTHROPLASTY  01/25/2012   Procedure: TOTAL KNEE ARTHROPLASTY;  Surgeon: Gearlean Alf, MD;  Location: [facility];  Service: Orthopedics;  Laterality: Right;     OB History   None      Home Medications    Prior to Admission medications   Medication Sig Start Date End Date Taking? Authorizing Provider  acetaminophen (TYLENOL) 500 MG tablet Take 500 mg by mouth every 6 (six) hours as needed (PAIN).  Yes [provider]  Apoaequorin (PREVAGEN) 10 MG CAPS Take 1 capsule by mouth daily.    Yes [provider]  dexamethasone (DECADRON) 2 MG tablet 2 mg 4 times daily for 6 days, then 29m twice daily for 1 week and then 247monce daily and then taper off as per clinical status. 11/19/17  Yes KrBonnielee HaffMD  feeding supplement, ENSURE ENLIVE, (ENSURE ENLIVE) LIQD Take 237 mLs by mouth 3 (three) times daily between meals. 11/19/17  Yes KrBonnielee HaffMD  levETIRAcetam (KEPPRA) 100 MG/ML solution Take 5 mLs (500 mg  total) by mouth 2 (two) times daily. 11/19/17  Yes KrBonnielee HaffMD  levothyroxine (SYNTHROID, LEVOTHROID) 75 MCG tablet Take 75 mcg by mouth daily before breakfast.   Yes [provider]  Lutein-Zeaxanthin 20-1 MG CAPS Take 1 tablet by mouth daily.   Yes [provider]  Magnesium 500 MG CAPS Take 1 capsule by mouth daily.   Yes [provider]  Memantine HCl-Donepezil HCl (NAMZARIC) 21-10 MG CP24 Take 1 tablet by mouth daily.   Yes [provider]  meropenem (MERREM) IVPB Inject 2 g into the vein every 12 (twelve) hours. Indication:  Brain Abscess Last Day of Therapy:  12/28/17 Labs - Once weekly:  CBC/D and BMP, Labs - Every other week:  ESR and CRP 11/19/17 12/30/17 Yes KrBonnielee HaffMD  pantoprazole (PROTONIX) 40 MG tablet Take 1 tablet (40 mg total) by mouth daily. 11/19/17  Yes KrBonnielee HaffMD  Polyethyl Glycol-Propyl Glycol (SYSTANE) 0.4-0.3 % SOLN Apply 1 drop to eye 2 (two) times daily. For both eyes.   Yes [provider]  polyethylene glycol (MIRALAX / GLYCOLAX) packet Take 17 g by mouth daily.   Yes [provider]  triamcinolone cream (KENALOG) 0.1 % Apply 1 application topically 2 (two) times daily as needed. Apply to hands and legs   Yes [provider]  vancomycin IVPB Inject 1,250 mg into the vein daily. Indication:  Brain abscess Last Day of Therapy:  12/28/17 Labs - Sunday/Monday:  CBC/D, BMP, and vancomycin trough. Labs - Thursday:  BMP and vancomycin trough Labs - Every other week:  ESR and CRP 11/19/17 12/30/17 Yes KrBonnielee HaffMD    Family History Family History  Problem Relation Age of Onset  . Heart attack Father   . Heart disease Father     Social History Social History   Tobacco Use  . Smoking status: Never Smoker  . Smokeless tobacco: Never Used  Substance Use Topics  . Alcohol use: No  . Drug use: No     Allergies   Augmentin [amoxicillin-pot clavulanate]; Quinine  derivatives; and Penicillins   Review of Systems Review of Systems  Unable to perform ROS: Mental status change     Physical Exam Updated Vital Signs BP (!) 67/42   Pulse (!) 109   Temp (!) 95.6 F (35.3 C) (Rectal)   Resp (!) 23   Ht 1.676 m (5' 6" )   Wt 81 kg   LMP  (LMP Unknown)   SpO2 (!) 68%   BMI 28.82 kg/m    Physical Exam  Constitutional:  Ill-appearing, mottled, minimally responsive  HENT:  Head: Normocephalic and atraumatic.  Eyes: Pupils are equal, round, and reactive to light.  Pupils 3 mm and reactive bilaterally, nystagmus noted  Neck: Neck supple.  Cardiovascular: Normal rate, regular rhythm and normal heart sounds.  Pulmonary/Chest: Effort normal. No respiratory distress. She has no wheezes.  Coarse breath sounds with  rhonchi and rales mostly right middle and lower lobe, shallow breathing  Abdominal: Soft. There is no tenderness.  Musculoskeletal: She exhibits no edema.  Neurological: She is unresponsive. GCS eye subscore is 3. GCS verbal subscore is 1. GCS motor subscore is 1.  Skin: Skin is warm and dry.  Psychiatric: She has a normal mood and affect.  Nursing note and vitals reviewed.    ED Treatments / Results  Labs (all labs ordered are listed, but only abnormal results are displayed) Labs Reviewed  COMPREHENSIVE METABOLIC PANEL - Abnormal; Notable for the following components:      Result Value   Sodium 132 (*)    Potassium 3.1 (*)    Chloride 96 (*)    Glucose, Bld 270 (*)    Calcium 10.9 (*)    Albumin 3.2 (*)    All other components within normal limits  CBC WITH DIFFERENTIAL/PLATELET - Abnormal; Notable for the following components:   WBC 19.8 (*)    RBC 5.64 (*)    Hemoglobin 17.6 (*)    HCT 52.8 (*)    Neutro Abs 17.8 (*)    Abs Immature Granulocytes 0.1 (*)    All other components within normal limits  I-STAT CG4 LACTIC ACID, ED - Abnormal; Notable for the following components:   Lactic Acid, Venous 3.49 (*)    All other  components within normal limits  CULTURE, BLOOD (ROUTINE X 2)  CULTURE, BLOOD (ROUTINE X 2)  URINALYSIS, ROUTINE W REFLEX MICROSCOPIC  I-STAT TROPONIN, ED  I-STAT CG4 LACTIC ACID, ED    EKG EKG Interpretation  Date/Time:  2017/12/06 04:33:08 EDT Ventricular Rate:  97 PR Interval:    QRS Duration: 89 QT Interval:  374 QTC Calculation: 476 R Axis:   -2 Text Interpretation:  Sinus rhythm Prolonged PR interval Probable left atrial enlargement Repol abnrm, severe global ischemia (LM/MVD) global depressions with elevation in AVR Confirmed by Thayer Jew [phone]) on 2017-12-06 4:42:52 AM   Radiology Ct Head Wo Contrast  Result Date: 12-06-17 CLINICAL DATA:  Altered level of consciousness. EXAM: CT HEAD WITHOUT CONTRAST TECHNIQUE: Contiguous axial images were obtained from the base of the skull through the vertex without intravenous contrast. COMPARISON:  11/12/2017 FINDINGS: Brain: Low-attenuation mass lesion or abscess again demonstrated in the left posterior frontal region with surrounding vasogenic edema. The mass measures about 3.8 cm in diameter. There is associated mass effect with effacement of sulci and left-to-right midline shift of about 8 mm. Mass effect is increasing since previous study. No abnormal extra-axial fluid collections. No acute intracranial hemorrhage. Underlying cerebral atrophy. Ventricular dilatation consistent with central atrophy. Vascular: No hyperdense vessel or unexpected calcification. Skull: Normal. Negative for fracture or focal lesion. Sinuses/Orbits: No acute finding. Other: None. IMPRESSION: Low-attenuation mass lesion or abscess in the left posterior frontal region with surrounding vasogenic edema causing mass effect and left-to-right midline shift of 8 mm. Mass effect is increasing since previous study. No acute intracranial hemorrhage. Underlying cerebral atrophy. Electronically Signed   By: Lucienne Capers M.D.   On: Dec 06, 2017 05:44    Dg Chest Port 1 View  Result Date: 2017/12/06 CLINICAL DATA:  Altered mental status EXAM: PORTABLE CHEST 1 VIEW COMPARISON:  None. FINDINGS: Left chest wall pacemaker leads project over the right atrium and right ventricle. There are opacities at the left lung base. Heart size normal. Right lung is clear. IMPRESSION: Left basilar opacities, consistent with atelectasis or consolidation. Electronically Signed   By: Cletus Gash.D.  On: 2017/12/20 04:50    Procedures Procedures (including critical care time)  CRITICAL CARE Performed by: Merryl Hacker   Total critical care time: 85 minutes  Critical care time was exclusive of separately billable procedures and treating other patients.  Critical care was necessary to treat or prevent imminent or life-threatening deterioration.  Critical care was time spent personally by me on the following activities: development of treatment plan with patient and/or surrogate as well as nursing, discussions with consultants, evaluation of patient's response to treatment, examination of patient, obtaining history from patient or surrogate, ordering and performing treatments and interventions, ordering and review of laboratory studies, ordering and review of radiographic studies, pulse oximetry and re-evaluation of patient's condition.   Medications Ordered in ED Medications  ondansetron (ZOFRAN-ODT) disintegrating tablet 4 mg (has no administration in time range)    Or  ondansetron (ZOFRAN) injection 4 mg (has no administration in time range)  glycopyrrolate (ROBINUL) tablet 1 mg (has no administration in time range)    Or  glycopyrrolate (ROBINUL) injection 0.2 mg (has no administration in time range)    Or  glycopyrrolate (ROBINUL) injection 0.2 mg (has no administration in time range)  antiseptic oral rinse (BIOTENE) solution 15 mL (has no administration in time range)  polyvinyl alcohol (LIQUIFILM TEARS) 1.4 % ophthalmic solution 1 drop  (has no administration in time range)  morphine 2 MG/ML injection 1 mg (1 mg Intravenous Given 12/20/2017 0605)  LORazepam (ATIVAN) injection 0.5 mg (0.5 mg Intravenous Given 12/20/2017 0437)  glycopyrrolate (ROBINUL) injection 0.1 mg (0.1 mg Intravenous Given 20-Dec-2017 0437)  sodium chloride 0.9 % bolus 1,000 mL (0 mLs Intravenous Stopped 12-20-17 0612)  0.9 %  sodium chloride infusion ( Intravenous New Bag/Given 12/20/17 0541)  LORazepam (ATIVAN) injection 0.5 mg (0.5 mg Intravenous Given December 20, 2017 0540)  dexamethasone (DECADRON) injection 10 mg (10 mg Intravenous Given 20-Dec-2017 0605)     Initial Impression / Assessment and Plan / ED Course  I have reviewed the triage vital signs and the nursing notes.  Pertinent labs & imaging results that were available during my care of the patient were reviewed by me and considered in my medical decision making (see chart for details).  Clinical Course as of Nov 24 611  Tue 12/20/2017  0505 Attempted to contacted the power of attorney and next of kin, Clelia Schaumann x2.  HIPPA compliant message was left.   [CH]  0532 Patient continues to be hypoxic even on BiPAP.  Minimally arousable.  No signs of seizure.  CT scan shows worsening midline shift but will wait on radiology formal read.  Will attempt to contact next of kin.  Otherwise, patient was given Ativan and Robinul for comfort.  She has been on broad-spectrum antibiotics.  Fluid is infusing as she now has blood pressure 69/49.  I feel patient is likely at end-of-life.   [CH]    Clinical Course User Index [CH] Horton, Barbette Hair, MD    Presents with altered mental status.  Known brain lesion.  She is on broad-spectrum antibiotics.  She appears to be actively dying.  She does have a DNR with her.  O2 sats initially 70%.  She is initially tachycardic but had a normal blood pressure.  Blood pressure began to trend downwards.  I am unable to contact her family or next of kin regarding goals of care.  Limited  work-up initiated.  CT scan shows progression of edema and worsening midline shift.  I do not feel  that this is compatible with survival.  She was given IM Decadron.  She was given fluids.  Patient was also given comfort measures medications including Ativan and Robinul for secretions.  At this time we will hold off on additional antibiotics as she has been on antibiotics as an outpatient.  Would like to clarify goals of care before any escalation of care.  She is on BiPAP without any improvement of O2 sats or mental status.  6:15 AM Discussed with hospitalist.  Feel she would be very appropriate for comfort care and palliative medicine.  I have attempted multiple times to contact next of kin.  Hospitalist is aware that additional antibiotics were not given as I feel the patient's moribund condition is likely related to this brain lesion for which she is not amenable to any further interventions.  I have placed comfort care orders.  6:48 AM Called by nursing.  Patient less responsive and without pulses.  She continues to have apneic respirations and organized rhythm on the monitor.  While I was observing the patient, she did have change in her rhythm to V. tach on the monitor.  She then appeared to be in PEA with no further apneic respirations.  Fortunately, her next of kin, her niece and power of attorney Clelia Schaumann did call this morning.  She confirms the patient was very clear that she would want to be comfortable.  She confirms goals of care as comfort only.  No aggressive measures were taken.  Power of attorney was updated on patient status and likely impending death.  She stated understanding.  Time of death 6:45 AM.  Final Clinical Impressions(s) / ED Diagnoses   Final diagnoses:  Glasgow coma scale total score 3-8, in the field (EMT or ambulance) [facility])  Brain mass    ED Discharge Orders    None      Horton, Barbette Hair, MD 12-22-17 [id number]  Merryl Hacker, MD 12/22/2017 [phone]

## 2017-12-17 NOTE — ED Notes (Signed)
DNR armband placed on pt ?

## 2017-12-17 NOTE — ED Notes (Signed)
Pt mottled up to her chest. Cap refill approx 5 seconds. Fingertips dusky/purple.

## 2017-12-17 NOTE — ED Notes (Signed)
Patient transported to CT 

## 2017-12-17 NOTE — Progress Notes (Signed)
Pt transported to CT and back to Trauma C on BIPAP without incidence.

## 2017-12-17 DEATH — deceased
